# Patient Record
Sex: Female | Born: 1950 | Race: White | Hispanic: No | Smoking: Never smoker
Health system: Southern US, Community
[De-identification: ages and names within clinical notes are randomized; demographics above are authoritative.]

## PROBLEM LIST (undated history)

## (undated) DIAGNOSIS — F32A Depression, unspecified: Secondary | ICD-10-CM

## (undated) DIAGNOSIS — T7840XA Allergy, unspecified, initial encounter: Secondary | ICD-10-CM

## (undated) DIAGNOSIS — R569 Unspecified convulsions: Secondary | ICD-10-CM

## (undated) DIAGNOSIS — S069X9A Unspecified intracranial injury with loss of consciousness of unspecified duration, initial encounter: Secondary | ICD-10-CM

## (undated) DIAGNOSIS — S069XAA Unspecified intracranial injury with loss of consciousness status unknown, initial encounter: Secondary | ICD-10-CM

## (undated) DIAGNOSIS — E079 Disorder of thyroid, unspecified: Secondary | ICD-10-CM

## (undated) DIAGNOSIS — F329 Major depressive disorder, single episode, unspecified: Secondary | ICD-10-CM

## (undated) HISTORY — DX: Allergy, unspecified, initial encounter: T78.40XA

## (undated) HISTORY — DX: Unspecified convulsions: R56.9

## (undated) HISTORY — DX: Disorder of thyroid, unspecified: E07.9

## (undated) HISTORY — PX: URETERAL REIMPLANTATION OF TRANSPLANTED KIDNEY: SHX2610

## (undated) HISTORY — DX: Major depressive disorder, single episode, unspecified: F32.9

## (undated) HISTORY — DX: Depression, unspecified: F32.A

## (undated) HISTORY — PX: OTHER SURGICAL HISTORY: SHX169

---

## 2012-02-11 ENCOUNTER — Ambulatory Visit: Payer: PRIVATE HEALTH INSURANCE

## 2012-02-11 ENCOUNTER — Ambulatory Visit (INDEPENDENT_AMBULATORY_CARE_PROVIDER_SITE_OTHER): Payer: PRIVATE HEALTH INSURANCE | Admitting: Internal Medicine

## 2012-02-11 VITALS — BP 118/78 | HR 64 | Temp 97.5°F | Resp 16 | Ht 64.0 in | Wt 129.4 lb

## 2012-02-11 DIAGNOSIS — M79642 Pain in left hand: Secondary | ICD-10-CM

## 2012-02-11 DIAGNOSIS — M79609 Pain in unspecified limb: Secondary | ICD-10-CM

## 2012-02-11 NOTE — Progress Notes (Signed)
  Subjective:    Patient ID: Shannon Barry, female    DOB: [birth year], 61 y.o.   MRN: [id number]  HPIShe fell striking Left hand against gram about 2 hours ago She is concerned about the pain around her second And third metacarpals At first her hand would not been well but now it is better From [place]   Review of Systems     Objective:   Physical Exam The left hand shows a lack of full extension at the DIPs third finger  There is also mild exaggeration of extension at the PIP with tenderness along the medial and lateral border of the PIP The MCP is tender with mild swelling around the third  MCP     [facility] reading (PRIMARY) by  Dr. 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

## 2012-02-11 NOTE — Patient Instructions (Addendum)
X-rays do not reveal a fracture but your exam indicates concern for a tendon injury that is causing a boutonnieres deformity Please see your orthopedist this next week and keep your finger splinted in full extension of both joints

## 2015-03-10 ENCOUNTER — Ambulatory Visit (INDEPENDENT_AMBULATORY_CARE_PROVIDER_SITE_OTHER): Payer: PRIVATE HEALTH INSURANCE

## 2015-03-10 ENCOUNTER — Ambulatory Visit (INDEPENDENT_AMBULATORY_CARE_PROVIDER_SITE_OTHER): Payer: PRIVATE HEALTH INSURANCE | Admitting: Family Medicine

## 2015-03-10 VITALS — BP 108/74 | HR 72 | Temp 97.3°F | Resp 18 | Ht 63.5 in | Wt 142.0 lb

## 2015-03-10 DIAGNOSIS — W19XXXA Unspecified fall, initial encounter: Secondary | ICD-10-CM

## 2015-03-10 DIAGNOSIS — M79645 Pain in left finger(s): Secondary | ICD-10-CM

## 2015-03-10 DIAGNOSIS — S62639A Displaced fracture of distal phalanx of unspecified finger, initial encounter for closed fracture: Secondary | ICD-10-CM

## 2015-03-10 NOTE — Progress Notes (Signed)
   Subjective:    Patient ID: Shannon Barry, female    DOB: [birth year], 64 y.o.   MRN: [id number]  HPI Patient presents with son for left pinky finger pain following a fall sustained 2 days ago where she tried to catch herself and put too much weight onto finger/hand. States that swelling has improve, however, pain is worse, especially if she touches finger. Bruising is unchanged and able to move finger, but is very painful. Denies fever or numbness.  Right hand dominate.   Allergies  Allergen Reactions  . Penicillins Hives  . Sulfa Antibiotics Other (See Comments)    violently ill   Review of Systems As noted above.    Objective:   Physical Exam  Constitutional: She is oriented to person, place, and time. She appears well-developed and well-nourished. No distress.  Blood pressure 108/74, pulse 72, temperature 97.3 F (36.3 C), temperature source Oral, resp. rate 18, height 5' 3.5" (1.613 m), weight 142 lb (64.411 kg), SpO2 96 %.  HENT:  Head: Normocephalic and atraumatic.  Right Ear: External ear normal.  Left Ear: External ear normal.  Eyes: Conjunctivae are normal. Right eye exhibits no discharge. Left eye exhibits no discharge. No scleral icterus.  Pulmonary/Chest: Effort normal.  Musculoskeletal: She exhibits tenderness. She exhibits no edema.       Left hand: She exhibits decreased range of motion and tenderness. She exhibits no bony tenderness, normal capillary refill, no deformity, no laceration and no swelling. Normal sensation noted. Decreased strength noted. She exhibits thumb/finger opposition. She exhibits no finger abduction and no wrist extension trouble.       Hands: Neurological: She is alert and oriented to person, place, and time. No cranial nerve deficit or sensory deficit. She exhibits normal muscle tone.  Skin: Skin is warm, dry and intact. Bruising (left little finger) noted. No abrasion, no ecchymosis, no laceration and no lesion noted. She is not diaphoretic.  No erythema. No pallor.  Psychiatric: She has a normal mood and affect. Her behavior is normal. Judgment and thought content normal.   [facility] reading (PRIMARY) by  Dr. Alwyn Ren. Closed fracture of distal phalanx of left little finger.     Assessment & Plan:  1. Fall, initial encounter 2. Finger pain, left 3. Distal phalanx or phalanges, closed fracture, initial encounter Finger splinted until able to hand specialist. Patient declines medication for pain at this time.  - DG Finger Little Left; Future - Ambulatory referral to Hand Surgery    Janan Ridge PA-C  [facility] 03/10/2015 8:47 PM

## 2015-03-10 NOTE — Progress Notes (Signed)
X-ray reviewed with [employer].  Significant size avulsion fracture of left fifth finger proximal portion of distal phalanx laterally was noted. Splinting was discussed and decision was made to refer to hand orthopedics  Peyton Najjar M.D.

## 2015-03-18 ENCOUNTER — Encounter: Payer: Self-pay | Admitting: Neurology

## 2015-03-18 ENCOUNTER — Ambulatory Visit (INDEPENDENT_AMBULATORY_CARE_PROVIDER_SITE_OTHER): Payer: PRIVATE HEALTH INSURANCE | Admitting: Neurology

## 2015-03-18 ENCOUNTER — Telehealth: Payer: Self-pay

## 2015-03-18 VITALS — BP 104/60 | HR 76 | Resp 20 | Ht 63.0 in | Wt 137.0 lb

## 2015-03-18 DIAGNOSIS — F0391 Unspecified dementia with behavioral disturbance: Secondary | ICD-10-CM

## 2015-03-18 DIAGNOSIS — I69354 Hemiplegia and hemiparesis following cerebral infarction affecting left non-dominant side: Secondary | ICD-10-CM | POA: Diagnosis not present

## 2015-03-18 DIAGNOSIS — G40201 Localization-related (focal) (partial) symptomatic epilepsy and epileptic syndromes with complex partial seizures, not intractable, with status epilepticus: Secondary | ICD-10-CM | POA: Diagnosis not present

## 2015-03-18 DIAGNOSIS — R296 Repeated falls: Secondary | ICD-10-CM | POA: Diagnosis not present

## 2015-03-18 DIAGNOSIS — R4701 Aphasia: Secondary | ICD-10-CM | POA: Insufficient documentation

## 2015-03-18 DIAGNOSIS — F03918 Unspecified dementia, unspecified severity, with other behavioral disturbance: Secondary | ICD-10-CM

## 2015-03-18 DIAGNOSIS — R270 Ataxia, unspecified: Secondary | ICD-10-CM | POA: Diagnosis not present

## 2015-03-18 DIAGNOSIS — F0390 Unspecified dementia without behavioral disturbance: Secondary | ICD-10-CM | POA: Insufficient documentation

## 2015-03-18 DIAGNOSIS — S098XXA Other specified injuries of head, initial encounter: Secondary | ICD-10-CM | POA: Diagnosis not present

## 2015-03-18 MED ORDER — LAMOTRIGINE 100 MG PO TABS
100.0000 mg | ORAL_TABLET | Freq: Two times a day (BID) | ORAL | Status: DC
Start: 2015-03-18 — End: 2015-06-02

## 2015-03-18 MED ORDER — DIVALPROEX SODIUM 125 MG PO DR TAB: 125.0000 mg | DELAYED_RELEASE_TABLET | Freq: Three times a day (TID) | ORAL | Status: DC

## 2015-03-18 NOTE — Telephone Encounter (Signed)
Received a phone call from pt's son asking to speak to Dr. Vickey Hugerohmeier regarding her mother's worsening condition.  Dr. Vickey Hugerohmeier asked me to call Dr. Frederik PearHopper's office for a referral. Dr. Frederik PearHopper's office refused to give a referral because pt has not been seen for any head trauma and it was not noted in her history.  Dr. Vickey Hugerohmeier spoke to pt's husband, and asked me to put this patient on her schedule for today at 3:00.
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
## 2015-04-15 ENCOUNTER — Encounter (INDEPENDENT_AMBULATORY_CARE_PROVIDER_SITE_OTHER): Payer: Self-pay

## 2015-04-15 ENCOUNTER — Ambulatory Visit (INDEPENDENT_AMBULATORY_CARE_PROVIDER_SITE_OTHER): Payer: PRIVATE HEALTH INSURANCE

## 2015-04-15 DIAGNOSIS — S098XXA Other specified injuries of head, initial encounter: Secondary | ICD-10-CM | POA: Diagnosis not present

## 2015-04-15 DIAGNOSIS — G40201 Localization-related (focal) (partial) symptomatic epilepsy and epileptic syndromes with complex partial seizures, not intractable, with status epilepticus: Secondary | ICD-10-CM | POA: Diagnosis not present

## 2015-04-15 DIAGNOSIS — R270 Ataxia, unspecified: Secondary | ICD-10-CM

## 2015-04-15 DIAGNOSIS — R4701 Aphasia: Secondary | ICD-10-CM

## 2015-04-15 DIAGNOSIS — F0391 Unspecified dementia with behavioral disturbance: Secondary | ICD-10-CM

## 2015-04-15 DIAGNOSIS — F03918 Unspecified dementia, unspecified severity, with other behavioral disturbance: Secondary | ICD-10-CM

## 2015-04-17 ENCOUNTER — Telehealth: Payer: Self-pay | Admitting: Neurology

## 2015-04-17 NOTE — Telephone Encounter (Signed)
11/18-LVM on Son's phone a return call.  Pt's VM was full.  EEG needs to be r/s'd and possibly the f/u appt for 12/5 as well.-SLB

## 2015-04-20 ENCOUNTER — Telehealth: Payer: Self-pay

## 2015-04-20 NOTE — Telephone Encounter (Signed)
Pt has not signed a medical release form. Results not sent to Darden AmberLaura Oliver, NP at this time.

## 2015-04-20 NOTE — Telephone Encounter (Signed)
-----   Message from Melvyn Novasarmen Dohmeier, MD sent at 04/17/2015  1:21 PM EST ----- Clear evdence of traumatic brain injury as described above. Gliosis means  scar formation. NO ACUTE INJURY- but this explains a seizure focus and memory impairment. CD

## 2015-04-20 NOTE — Telephone Encounter (Signed)
Spoke to pt regarding her MRI results. I advised her that her MRI showed evidence of a TBI and some scar formation was noted. There was no acute injury, but these findings may explain why a pt is experiencing a seizure focus and memory impairment. Pt verbalized understanding. Pt asked me to fax these results to Darden AmberLaura Oliver, [place] in [place].

## 2015-04-21 NOTE — Telephone Encounter (Deleted)
Pt called and is wondering if she needs an EEG.Please call and advise

## 2015-04-27 ENCOUNTER — Other Ambulatory Visit: Payer: Self-pay

## 2015-05-04 ENCOUNTER — Ambulatory Visit (INDEPENDENT_AMBULATORY_CARE_PROVIDER_SITE_OTHER): Payer: PRIVATE HEALTH INSURANCE | Admitting: Neurology

## 2015-05-04 ENCOUNTER — Encounter (INDEPENDENT_AMBULATORY_CARE_PROVIDER_SITE_OTHER): Payer: Self-pay

## 2015-05-04 ENCOUNTER — Encounter: Payer: Self-pay | Admitting: Neurology

## 2015-05-04 ENCOUNTER — Telehealth: Payer: Self-pay | Admitting: Neurology

## 2015-05-04 VITALS — BP 122/72 | HR 84 | Resp 20 | Ht 62.0 in | Wt 136.0 lb

## 2015-05-04 DIAGNOSIS — S069X5S Unspecified intracranial injury with loss of consciousness greater than 24 hours with return to pre-existing conscious level, sequela: Secondary | ICD-10-CM | POA: Diagnosis not present

## 2015-05-04 DIAGNOSIS — G9389 Other specified disorders of brain: Secondary | ICD-10-CM

## 2015-05-04 DIAGNOSIS — F1097 Alcohol use, unspecified with alcohol-induced persisting dementia: Secondary | ICD-10-CM | POA: Diagnosis not present

## 2015-05-04 DIAGNOSIS — S069XAA Unspecified intracranial injury with loss of consciousness status unknown, initial encounter: Secondary | ICD-10-CM | POA: Insufficient documentation

## 2015-05-04 DIAGNOSIS — F1027 Alcohol dependence with alcohol-induced persisting dementia: Secondary | ICD-10-CM

## 2015-05-04 DIAGNOSIS — R561 Post traumatic seizures: Secondary | ICD-10-CM | POA: Insufficient documentation

## 2015-05-04 DIAGNOSIS — G969 Disorder of central nervous system, unspecified: Secondary | ICD-10-CM | POA: Insufficient documentation

## 2015-05-04 DIAGNOSIS — S069X9A Unspecified intracranial injury with loss of consciousness of unspecified duration, initial encounter: Secondary | ICD-10-CM | POA: Insufficient documentation

## 2015-05-04 MED ORDER — DIVALPROEX SODIUM 500 MG PO DR TAB: 500.0000 mg | DELAYED_RELEASE_TABLET | Freq: Two times a day (BID) | ORAL | Status: DC

## 2015-05-04 NOTE — Telephone Encounter (Signed)
Pt had been taking depake 500 mg 1 tablet in the morning and 2 tablets at bedtime.   Dr. Vickey Hugerohmeier refilled for depake 500 mg 1 tablet in the morning and 1 tablet at bedtime.  I will ask Dr. Vickey Hugerohmeier if she wants pt to take the dose with 1500mg  of depakote or to keep the 1000mg  of depakote daily as just ordered.

## 2015-05-04 NOTE — Addendum Note (Signed)
Addended by: Melvyn NovasHMEIER, Dale Ribeiro on: 05/04/2015 02:23 PM   Modules accepted: Orders

## 2015-05-04 NOTE — Telephone Encounter (Signed)
I am sorry I was under the impression I had not changed her dosing. It should still be 500 mg in the morning and thousand milligrams at night. I do think the tremor may have something to do with the Depakote.CD I have just reviewed the patient's signature and it says 1 tablet in the morning 2 at night but this remains to intake times even that the dose is not the same for morning and evening. She has 90 Depakote's per month that covers exactly 1 tablet in the morning 2 at night.

## 2015-05-04 NOTE — Telephone Encounter (Signed)
Tammy with [facility] called inquiring about qty and directions for divalproex (DEPAKOTE) 500 MG DR tablet . Previous was 500mg  1 am and 2 at bedtime. Please call [phone]
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
## 2015-05-05 MED ORDER — DIVALPROEX SODIUM 500 MG PO DR TAB: DELAYED_RELEASE_TABLET | ORAL | Status: DC

## 2015-05-05 NOTE — Telephone Encounter (Signed)
[facility] at [facility], he asked if Dr. Vickey Hugerohmeier wanted the change in depakote to 1 tablet in am and 1 tablet in pm or did she want the old sig for depakote, 1 tablet in the morning and 2 tablets at bedtime. I advised him that per Dr. Oliva Bustardohmeier's response in this conversation, it should be still 500mg  in the morning and 1000mg  at night. Shannon Barry verbalized understanding.

## 2015-05-19 ENCOUNTER — Other Ambulatory Visit: Payer: PRIVATE HEALTH INSURANCE

## 2015-05-20 ENCOUNTER — Encounter: Payer: Self-pay | Admitting: Neurology

## 2015-06-02 ENCOUNTER — Other Ambulatory Visit: Payer: Self-pay | Admitting: Neurology

## 2015-07-08 ENCOUNTER — Emergency Department (HOSPITAL_COMMUNITY)
Admission: EM | Admit: 2015-07-08 | Discharge: 2015-07-08 | Disposition: A | Discharge status: Home / Self Care | Payer: Self-pay | Attending: Emergency Medicine | Admitting: Emergency Medicine

## 2015-07-08 ENCOUNTER — Encounter (HOSPITAL_COMMUNITY): Payer: Self-pay | Admitting: Emergency Medicine

## 2015-07-08 DIAGNOSIS — Z87820 Personal history of traumatic brain injury: Secondary | ICD-10-CM | POA: Insufficient documentation

## 2015-07-08 DIAGNOSIS — R42 Dizziness and giddiness: Secondary | ICD-10-CM | POA: Insufficient documentation

## 2015-07-08 DIAGNOSIS — Z880 Allergy status to penicillin: Secondary | ICD-10-CM | POA: Insufficient documentation

## 2015-07-08 DIAGNOSIS — F329 Major depressive disorder, single episode, unspecified: Secondary | ICD-10-CM | POA: Insufficient documentation

## 2015-07-08 DIAGNOSIS — Z79899 Other long term (current) drug therapy: Secondary | ICD-10-CM | POA: Insufficient documentation

## 2015-07-08 DIAGNOSIS — E079 Disorder of thyroid, unspecified: Secondary | ICD-10-CM | POA: Insufficient documentation

## 2015-07-08 HISTORY — DX: Unspecified intracranial injury with loss of consciousness status unknown, initial encounter: S06.9XAA

## 2015-07-08 HISTORY — DX: Unspecified intracranial injury with loss of consciousness of unspecified duration, initial encounter: S06.9X9A

## 2015-07-08 LAB — URINALYSIS, ROUTINE W REFLEX MICROSCOPIC
BILIRUBIN URINE: NEGATIVE
Glucose, UA: NEGATIVE mg/dL
KETONES UR: NEGATIVE mg/dL
NITRITE: NEGATIVE
PH: 6.5 (ref 5.0–8.0)
PROTEIN: NEGATIVE mg/dL
Specific Gravity, Urine: 1.023 (ref 1.005–1.030)

## 2015-07-08 LAB — URINE MICROSCOPIC-ADD ON

## 2015-07-08 LAB — BASIC METABOLIC PANEL
Anion gap: 6 (ref 5–15)
BUN: 16 mg/dL (ref 6–20)
CALCIUM: 8.8 mg/dL — AB (ref 8.9–10.3)
CO2: 29 mmol/L (ref 22–32)
Chloride: 98 mmol/L — ABNORMAL LOW (ref 101–111)
Creatinine, Ser: 0.66 mg/dL (ref 0.44–1.00)
GFR calc Af Amer: >60 mL/min (ref >60)
GLUCOSE: 85 mg/dL (ref 65–99)
Potassium: 4.7 mmol/L (ref 3.5–5.1)
SODIUM: 133 mmol/L — AB (ref 135–145)

## 2015-07-08 LAB — CBC
HCT: 36.3 % (ref 36.0–46.0)
Hemoglobin: 12.2 g/dL (ref 12.0–15.0)
MCH: 31.3 pg (ref 26.0–34.0)
MCHC: 33.6 g/dL (ref 30.0–36.0)
MCV: 93.1 fL (ref 78.0–100.0)
PLATELETS: 166 10*3/uL (ref 150–400)
RBC: 3.9 MIL/uL (ref 3.87–5.11)
RDW: 13.3 % (ref 11.5–15.5)
WBC: 5.8 10*3/uL (ref 4.0–10.5)

## 2015-07-08 LAB — CBG MONITORING, ED: GLUCOSE-CAPILLARY: 83 mg/dL (ref 65–99)

## 2015-07-08 MED ORDER — MECLIZINE HCL 25 MG PO TABS
25.0000 mg | ORAL_TABLET | Freq: Once | ORAL | Status: AC
  Administered 2015-07-08: 25 mg via ORAL
  Filled 2015-07-08: qty 1

## 2015-07-08 MED ORDER — PROMETHAZINE HCL 25 MG PO TABS
12.5000 mg | ORAL_TABLET | Freq: Once | ORAL | Status: AC
  Administered 2015-07-08: 12.5 mg via ORAL
  Filled 2015-07-08: qty 1

## 2015-07-08 MED ORDER — MECLIZINE HCL 50 MG PO TABS: 25.0000 mg | ORAL_TABLET | Freq: Three times a day (TID) | ORAL | Status: DC | PRN

## 2015-07-08 NOTE — ED Notes (Signed)
Pt ambulated to bathroom with assist x1 without difficulty

## 2015-07-08 NOTE — Discharge Instructions (Signed)

## 2015-07-08 NOTE — ED Notes (Signed)
Patient reports feeling dizzy which began this morning.  Reports when moving in one direction she "keeps going".  Patient does not use assistive devices at present.  Denies headache, blurred vision, weakness.

## 2015-07-08 NOTE — ED Notes (Signed)
Per EMS- Patient is a resident of [facility].Patient c/o dizziness. Patient reported that she almost fell, but did not.

## 2015-07-08 NOTE — ED Notes (Signed)
Ambulated patient in hallway.  Patient still states she feels unsteady.  Gait noted to be unsteady but able to ambulated with assistance x 1.

## 2015-07-09 ENCOUNTER — Other Ambulatory Visit: Payer: PRIVATE HEALTH INSURANCE

## 2015-07-09 LAB — URINE CULTURE

## 2015-07-09 NOTE — ED Provider Notes (Signed)
CSN: [id number]     Arrival date & time 07/08/15  1049 History   First MD Initiated Contact with Patient 07/08/15 1201     Chief Complaint  Patient presents with  . Dizziness     (Consider location/radiation/quality/duration/timing/severity/associated sxs/prior Treatment) HPI   64yF with vertigo. Onset this morning as was getting out of bed. Felt very off balance and had to sit back down. Symptoms improved but then returned as she went to get back up. Had to hold onto things to steady herself. Has had similar symptoms previously, but not quite this severe. No acute pain. NO visual changes. No numbness, tingling or focal loss of strength. No tinnitus or ear fullness. No intervention prior to arrival.   Past Medical History  Diagnosis Date  . Depression   . Seizures (HCC)   . Allergy   . Thyroid disease   . Traumatic brain injury [facility])    History reviewed. No pertinent past surgical history. History reviewed. No pertinent family history. Social History  Substance Use Topics  . Smoking status: Never Smoker   . Smokeless tobacco: None  . Alcohol Use: No   OB History    No data available     Review of Systems  All systems reviewed and negative, other than as noted in HPI.   Allergies  Penicillins and Sulfa antibiotics  Home Medications   Prior to Admission medications   Medication Sig Start Date End Date Taking? Authorizing Provider  desvenlafaxine (PRISTIQ) 100 MG 24 hr tablet Take 100 mg by mouth daily.    Yes Historical Provider, MD  divalproex (DEPAKOTE) 500 MG DR tablet Take 1 tablet in the morning and 2 tablets at night. 05/05/15  Yes Carmen Dohmeier, MD  lamoTRIgine (LAMICTAL) 100 MG tablet TAKE 2 TABLET BY MOUTH AT BEDTIME Patient taking differently: TAKE 1 TABLET BY MOUTH AT BEDTIME 06/03/15  Yes Carmen Dohmeier, MD  levothyroxine (SYNTHROID, LEVOTHROID) 75 MCG tablet Take 75 mcg by mouth daily.   Yes Historical Provider, MD  meclizine (ANTIVERT) 50 MG tablet Take  0.5 tablets (25 mg total) by mouth 3 (three) times daily as needed for dizziness. 07/08/15   [name], MD   BP 139/72 mmHg  Pulse 71  Temp(Src) 98.1 F (36.7 C)  Resp 16  Ht  (1.6 m)  Wt 130 lb (58.968 kg)  BMI 23.03 kg/m2  SpO2 100% Physical Exam  Constitutional: She is oriented to person, place, and time. She appears well-developed and well-nourished. No distress.  HENT:  Head: Normocephalic and atraumatic.  Right Ear: External ear normal.  Left Ear: External ear normal.  Eyes: Conjunctivae and EOM are normal. Pupils are equal, round, and reactive to light. Right eye exhibits no discharge. Left eye exhibits no discharge.  Neck: Neck supple.  Cardiovascular: Normal rate, regular rhythm and normal heart sounds.  Exam reveals no gallop and no friction rub.   No murmur heard. Pulmonary/Chest: Effort normal and breath sounds normal. No respiratory distress.  Abdominal: Soft. She exhibits no distension. There is no tenderness.  Musculoskeletal: She exhibits no edema or tenderness.  Neurological: She is alert and oriented to person, place, and time. No cranial nerve deficit. She exhibits normal muscle tone. Coordination normal.  Good finger to nose bilaterally. Ambulated without assistance.  Skin: Skin is warm and dry.  Psychiatric: She has a normal mood and affect. Her behavior is normal. Thought content normal.  Nursing note and vitals reviewed.   ED Course  Procedures (including critical care  time) Labs Review Labs Reviewed  BASIC METABOLIC PANEL - Abnormal; Notable for the following:    Sodium 133 (*)    Chloride 98 (*)    Calcium 8.8 (*)    All other components within normal limits  URINALYSIS, ROUTINE W REFLEX MICROSCOPIC (NOT AT [facility]) - Abnormal; Notable for the following:    Hgb urine dipstick TRACE (*)    Leukocytes, UA SMALL (*)    All other components within normal limits  URINE MICROSCOPIC-ADD ON - Abnormal; Notable for the following:    Squamous Epithelial  / LPF 0-5 (*)    Bacteria, UA RARE (*)    All other components within normal limits  URINE CULTURE  CBC  CBG MONITORING, ED    Imaging Review No results found. I have personally reviewed and evaluated these images and lab results as part of my medical decision-making.   EKG Interpretation   Date/Time:  Wednesday July 08 2015 11:17:35 EST Ventricular Rate:  76 PR Interval:  173 QRS Duration: 76 QT Interval:  367 QTC Calculation: 413 R Axis:   71 Text Interpretation:  Sinus rhythm Low voltage, precordial leads Baseline  wander in lead(s) V2 V5 No old tracing to compare Confirmed by FLOYD MD,  DANIEL [phone]) on 07/08/2015 12:01:05 PM      MDM   Final diagnoses:  Vertigo    65 year old female with symptoms consistent with peripheral vertigo. Central causes considered, but I have a low suspicion.  Nonfocal neurological examination. Treated symptomatically with improvement. Plan continued symtpomatic tx.     [name], MD 07/09/15 [phone]

## 2015-07-10 ENCOUNTER — Encounter: Payer: Self-pay | Admitting: Neurology

## 2015-07-10 ENCOUNTER — Encounter (HOSPITAL_COMMUNITY): Payer: Self-pay | Admitting: Emergency Medicine

## 2015-07-10 ENCOUNTER — Inpatient Hospital Stay (HOSPITAL_COMMUNITY)
Admission: EM | Admit: 2015-07-10 | Discharge: 2015-07-15 | DRG: 690 | Disposition: A | Discharge status: Home Health | Payer: PRIVATE HEALTH INSURANCE | Attending: Internal Medicine | Admitting: Internal Medicine

## 2015-07-10 DIAGNOSIS — R561 Post traumatic seizures: Secondary | ICD-10-CM | POA: Diagnosis present

## 2015-07-10 DIAGNOSIS — R569 Unspecified convulsions: Secondary | ICD-10-CM

## 2015-07-10 DIAGNOSIS — F0390 Unspecified dementia without behavioral disturbance: Secondary | ICD-10-CM | POA: Diagnosis present

## 2015-07-10 DIAGNOSIS — E871 Hypo-osmolality and hyponatremia: Secondary | ICD-10-CM | POA: Diagnosis present

## 2015-07-10 DIAGNOSIS — Z882 Allergy status to sulfonamides status: Secondary | ICD-10-CM

## 2015-07-10 DIAGNOSIS — E860 Dehydration: Secondary | ICD-10-CM | POA: Diagnosis present

## 2015-07-10 DIAGNOSIS — R112 Nausea with vomiting, unspecified: Secondary | ICD-10-CM | POA: Diagnosis present

## 2015-07-10 DIAGNOSIS — Z79899 Other long term (current) drug therapy: Secondary | ICD-10-CM

## 2015-07-10 DIAGNOSIS — D696 Thrombocytopenia, unspecified: Secondary | ICD-10-CM | POA: Diagnosis present

## 2015-07-10 DIAGNOSIS — E039 Hypothyroidism, unspecified: Secondary | ICD-10-CM | POA: Diagnosis present

## 2015-07-10 DIAGNOSIS — F329 Major depressive disorder, single episode, unspecified: Secondary | ICD-10-CM | POA: Diagnosis present

## 2015-07-10 DIAGNOSIS — R269 Unspecified abnormalities of gait and mobility: Secondary | ICD-10-CM | POA: Diagnosis present

## 2015-07-10 DIAGNOSIS — Z880 Allergy status to penicillin: Secondary | ICD-10-CM

## 2015-07-10 DIAGNOSIS — R42 Dizziness and giddiness: Secondary | ICD-10-CM | POA: Insufficient documentation

## 2015-07-10 DIAGNOSIS — N390 Urinary tract infection, site not specified: Secondary | ICD-10-CM | POA: Diagnosis not present

## 2015-07-10 DIAGNOSIS — Z87820 Personal history of traumatic brain injury: Secondary | ICD-10-CM

## 2015-07-10 DIAGNOSIS — R079 Chest pain, unspecified: Secondary | ICD-10-CM | POA: Diagnosis present

## 2015-07-10 DIAGNOSIS — Z791 Long term (current) use of non-steroidal anti-inflammatories (NSAID): Secondary | ICD-10-CM

## 2015-07-10 MED ORDER — MECLIZINE HCL 25 MG PO TABS
25.0000 mg | ORAL_TABLET | Freq: Once | ORAL | Status: AC
  Administered 2015-07-11: 25 mg via ORAL
  Filled 2015-07-10: qty 1

## 2015-07-10 NOTE — ED Notes (Signed)
Bed: WA22 Expected date:  Expected time:  Means of arrival:  Comments: 

## 2015-07-10 NOTE — ED Notes (Signed)
[facility] presents with a 65 yo female from [place] with nausea and vomiting since this evening around dinner time per patient.  No CP/No SOB.  [facility] gave 4 mg Zofran IV with slight relief of symptoms.  VS stable.

## 2015-07-10 NOTE — ED Provider Notes (Signed)
CSN: [id number]     Arrival date & time 07/10/15  2230 History   First MD Initiated Contact with Patient 07/10/15 2251     Chief Complaint  Patient presents with  . Nausea  . Emesis     (Consider location/radiation/quality/duration/timing/severity/associated sxs/prior Treatment) The history is provided by the patient, a relative and medical records. No language interpreter was used.     Shannon Barry is a 65 y.o. female  with a PMH of seizures currently on lamictal and depakote, TBI, hypothyroidism who presents to the Emergency Department complaining of dizziness, nausea, and vomiting since 5:30 tonight. Patient is unable to specify whether dizziness preceded n/v. Dizziness worse with movement. Improved with lying down and keeping eyes closed. Seen in ED for dizziness two days ago, states symptoms improved. Per son at bedside, patient seemed a little confused two days ago similar to her usual post ictal state, and again today - there have been no witnessed seizures. Denies tinnitus, no visual changes, slurred speech.   Past Medical History  Diagnosis Date  . Depression   . Seizures (HCC)   . Allergy   . Thyroid disease   . Traumatic brain injury [facility])    History reviewed. No pertinent past surgical history. History reviewed. No pertinent family history. Social History  Substance Use Topics  . Smoking status: Never Smoker   . Smokeless tobacco: None  . Alcohol Use: No   OB History    No data available     Review of Systems  Constitutional: Negative for fever and chills.  HENT: Negative for congestion and sore throat.   Eyes: Negative for photophobia.  Respiratory: Negative for cough, shortness of breath and wheezing.   Cardiovascular: Negative.   Gastrointestinal: Positive for nausea and vomiting. Negative for abdominal pain.  Genitourinary: Negative for dysuria.  Musculoskeletal: Negative for back pain and neck pain.  Skin: Negative for rash.  Neurological: Positive for  dizziness and headaches. Negative for syncope.      Allergies  Penicillins and Sulfa antibiotics  Home Medications   Prior to Admission medications   Medication Sig Start Date End Date Taking? Authorizing Provider  desvenlafaxine (PRISTIQ) 100 MG 24 hr tablet Take 100 mg by mouth daily.    Yes Historical Provider, MD  divalproex (DEPAKOTE) 500 MG DR tablet Take 1 tablet in the morning and 2 tablets at night. 05/05/15  Yes Carmen Dohmeier, MD  ibuprofen (ADVIL,MOTRIN) 200 MG tablet Take 200 mg by mouth daily.   Yes Historical Provider, MD  lamoTRIgine (LAMICTAL) 100 MG tablet TAKE 2 TABLET BY MOUTH AT BEDTIME Patient taking differently: TAKE 1 TABLET BY MOUTH AT BEDTIME 06/03/15  Yes Carmen Dohmeier, MD  levothyroxine (SYNTHROID, LEVOTHROID) 75 MCG tablet Take 75 mcg by mouth daily.   Yes Historical Provider, MD  meclizine (ANTIVERT) 25 MG tablet Take 1 tablet (25 mg total) by mouth 3 (three) times daily as needed for dizziness. 07/11/15   Chase Picket Ward, PA-C  ondansetron (ZOFRAN) 4 MG tablet Take 1 tablet (4 mg total) by mouth every 8 (eight) hours as needed for nausea or vomiting. 07/11/15   Chase Picket Ward, PA-C   BP 144/75 mmHg  Pulse 76  Temp(Src) 98.9 F (37.2 C) (Oral)  Resp 18  SpO2 100% Physical Exam  Constitutional: She is oriented to person, place, and time. She appears well-developed and well-nourished.  NAD  HENT:  Head: Normocephalic and atraumatic.  No evidence of tongue biting.   Cardiovascular: Normal rate, regular rhythm, normal  heart sounds and intact distal pulses.  Exam reveals no gallop and no friction rub.   No murmur heard. Pulmonary/Chest: Effort normal and breath sounds normal. No respiratory distress. She has no wheezes. She has no rales. She exhibits no tenderness.  Abdominal: Soft. Bowel sounds are normal. She exhibits no distension and no mass. There is no tenderness. There is no rebound and no guarding.  Musculoskeletal: She exhibits no edema.   Neurological: She is alert and oriented to person, place, and time. No cranial nerve deficit.  Alert, oriented, thought content appropriate, able to follow commands.  Cranial Nerves:  II:  Peripheral visual fields grossly normal, pupils equal, round, reactive to light III, IV, VI: EOM intact bilaterally, ptosis not present V,VII: smile symmetric, eyes kept closed tightly against resistance, facial light touch sensation equal VIII: hearing grossly normal IX, X: symmetric soft palate movement, uvula elevates symmetrically  XI: bilateral shoulder shrug symmetric and strong XII: midline tongue extension 5/5 muscle strength in upper and lower extremities bilaterally including strong and equal grip strength and dorsiflexion/plantar flexion Sensory to light touch normal in all four extremities.  Normal finger-to-nose and rapid alternating movements  Skin: Skin is warm and dry. No rash noted.  Psychiatric: She has a normal mood and affect. Her behavior is normal. Judgment and thought content normal.  Nursing note and vitals reviewed.   ED Course  Procedures (including critical care time) Labs Review Labs Reviewed  COMPREHENSIVE METABOLIC PANEL - Abnormal; Notable for the following:    Sodium 125 (*)    Chloride 89 (*)    Glucose, Bld 125 (*)    Calcium 8.7 (*)    Total Protein 6.3 (*)    ALT 9 (*)    All other components within normal limits  URINALYSIS, ROUTINE W REFLEX MICROSCOPIC (NOT AT [facility]) - Abnormal; Notable for the following:    Hgb urine dipstick TRACE (*)    Ketones, ur >80 (*)    Protein, ur 30 (*)    Leukocytes, UA SMALL (*)    All other components within normal limits  URINE MICROSCOPIC-ADD ON - Abnormal; Notable for the following:    Squamous Epithelial / LPF 0-5 (*)    Bacteria, UA FEW (*)    All other components within normal limits  URINE CULTURE  VALPROIC ACID LEVEL  CBC WITH DIFFERENTIAL/PLATELET  LIPASE, BLOOD  LAMOTRIGINE LEVEL    Imaging Review No  results found. I have personally reviewed and evaluated these images and lab results as part of my medical decision-making.   EKG Interpretation None      MDM   Final diagnoses:  Vertigo   Brittany Osier presents with dizziness, nausea, and vomiting start today at 5:30 pm. No focal neuro deficits.   1:26 AM - Patient re-evaluated and feels much improved after meclizine. UA with large amounts of wbc, small leuks - Patient denies dysuria, frequency, urgency. No suprapubic tenderness.   CMP with sodium of 125 and cl of 89. When here on 2/08 sodium was 133 and cl 98 Due to recurring symptoms of n/v/dizziness and decline in CMP values, consult to hospitalist, Dr. Lovell Sheehan, who will admit.    [facility] Ward, PA-C 07/11/15 0230  Lorre Nick, MD 07/12/15 [phone]

## 2015-07-11 ENCOUNTER — Observation Stay (HOSPITAL_COMMUNITY): Payer: PRIVATE HEALTH INSURANCE

## 2015-07-11 DIAGNOSIS — F329 Major depressive disorder, single episode, unspecified: Secondary | ICD-10-CM | POA: Diagnosis present

## 2015-07-11 DIAGNOSIS — R569 Unspecified convulsions: Secondary | ICD-10-CM

## 2015-07-11 DIAGNOSIS — Z882 Allergy status to sulfonamides status: Secondary | ICD-10-CM | POA: Diagnosis not present

## 2015-07-11 DIAGNOSIS — Z87820 Personal history of traumatic brain injury: Secondary | ICD-10-CM | POA: Diagnosis not present

## 2015-07-11 DIAGNOSIS — E871 Hypo-osmolality and hyponatremia: Secondary | ICD-10-CM | POA: Diagnosis present

## 2015-07-11 DIAGNOSIS — Z791 Long term (current) use of non-steroidal anti-inflammatories (NSAID): Secondary | ICD-10-CM | POA: Diagnosis not present

## 2015-07-11 DIAGNOSIS — R561 Post traumatic seizures: Secondary | ICD-10-CM | POA: Diagnosis present

## 2015-07-11 DIAGNOSIS — Z79899 Other long term (current) drug therapy: Secondary | ICD-10-CM | POA: Diagnosis not present

## 2015-07-11 DIAGNOSIS — N390 Urinary tract infection, site not specified: Secondary | ICD-10-CM | POA: Diagnosis present

## 2015-07-11 DIAGNOSIS — R112 Nausea with vomiting, unspecified: Secondary | ICD-10-CM | POA: Diagnosis present

## 2015-07-11 DIAGNOSIS — R079 Chest pain, unspecified: Secondary | ICD-10-CM | POA: Diagnosis present

## 2015-07-11 DIAGNOSIS — R42 Dizziness and giddiness: Secondary | ICD-10-CM | POA: Diagnosis not present

## 2015-07-11 DIAGNOSIS — Z880 Allergy status to penicillin: Secondary | ICD-10-CM | POA: Diagnosis not present

## 2015-07-11 DIAGNOSIS — D696 Thrombocytopenia, unspecified: Secondary | ICD-10-CM | POA: Diagnosis present

## 2015-07-11 DIAGNOSIS — E860 Dehydration: Secondary | ICD-10-CM | POA: Diagnosis present

## 2015-07-11 DIAGNOSIS — E039 Hypothyroidism, unspecified: Secondary | ICD-10-CM | POA: Diagnosis present

## 2015-07-11 DIAGNOSIS — F0390 Unspecified dementia without behavioral disturbance: Secondary | ICD-10-CM | POA: Diagnosis present

## 2015-07-11 DIAGNOSIS — R269 Unspecified abnormalities of gait and mobility: Secondary | ICD-10-CM | POA: Diagnosis present

## 2015-07-11 LAB — TROPONIN I
Troponin I: <0.03 ng/mL (ref <0.031)
Troponin I: <0.03 ng/mL (ref <0.031)
Troponin I: <0.03 ng/mL (ref <0.031)

## 2015-07-11 LAB — CBC WITH DIFFERENTIAL/PLATELET
BASOS PCT: 0 %
Basophils Absolute: 0 10*3/uL (ref 0.0–0.1)
Eosinophils Absolute: 0 10*3/uL (ref 0.0–0.7)
Eosinophils Relative: 0 %
HEMATOCRIT: 39.4 % (ref 36.0–46.0)
HEMOGLOBIN: 13.8 g/dL (ref 12.0–15.0)
Lymphocytes Relative: 10 %
Lymphs Abs: 0.7 10*3/uL (ref 0.7–4.0)
MCH: 31.7 pg (ref 26.0–34.0)
MCHC: 35 g/dL (ref 30.0–36.0)
MCV: 90.6 fL (ref 78.0–100.0)
MONOS PCT: 4 %
Monocytes Absolute: 0.3 10*3/uL (ref 0.1–1.0)
NEUTROS ABS: 5.8 10*3/uL (ref 1.7–7.7)
NEUTROS PCT: 86 %
Platelets: 166 10*3/uL (ref 150–400)
RBC: 4.35 MIL/uL (ref 3.87–5.11)
RDW: 12.6 % (ref 11.5–15.5)
WBC: 6.7 10*3/uL (ref 4.0–10.5)

## 2015-07-11 LAB — BASIC METABOLIC PANEL
ANION GAP: 9 (ref 5–15)
BUN: 11 mg/dL (ref 6–20)
CHLORIDE: 91 mmol/L — AB (ref 101–111)
CO2: 27 mmol/L (ref 22–32)
CREATININE: 0.56 mg/dL (ref 0.44–1.00)
Calcium: 9.1 mg/dL (ref 8.9–10.3)
GFR calc non Af Amer: >60 mL/min (ref >60)
Glucose, Bld: 119 mg/dL — ABNORMAL HIGH (ref 65–99)
POTASSIUM: 4.8 mmol/L (ref 3.5–5.1)
SODIUM: 127 mmol/L — AB (ref 135–145)

## 2015-07-11 LAB — COMPREHENSIVE METABOLIC PANEL
ALBUMIN: 3.5 g/dL (ref 3.5–5.0)
ALK PHOS: 44 U/L (ref 38–126)
ALT: 9 U/L — AB (ref 14–54)
ANION GAP: 11 (ref 5–15)
AST: 19 U/L (ref 15–41)
BILIRUBIN TOTAL: 0.6 mg/dL (ref 0.3–1.2)
BUN: 16 mg/dL (ref 6–20)
CALCIUM: 8.7 mg/dL — AB (ref 8.9–10.3)
CO2: 25 mmol/L (ref 22–32)
CREATININE: 0.56 mg/dL (ref 0.44–1.00)
Chloride: 89 mmol/L — ABNORMAL LOW (ref 101–111)
GFR calc Af Amer: >60 mL/min (ref >60)
GFR calc non Af Amer: >60 mL/min (ref >60)
GLUCOSE: 125 mg/dL — AB (ref 65–99)
Potassium: 4 mmol/L (ref 3.5–5.1)
SODIUM: 125 mmol/L — AB (ref 135–145)
TOTAL PROTEIN: 6.3 g/dL — AB (ref 6.5–8.1)

## 2015-07-11 LAB — URINALYSIS, ROUTINE W REFLEX MICROSCOPIC
BILIRUBIN URINE: NEGATIVE
GLUCOSE, UA: NEGATIVE mg/dL
Nitrite: NEGATIVE
PH: 6 (ref 5.0–8.0)
Protein, ur: 30 mg/dL — AB
SPECIFIC GRAVITY, URINE: 1.025 (ref 1.005–1.030)

## 2015-07-11 LAB — CBC
HCT: 36.3 % (ref 36.0–46.0)
HEMOGLOBIN: 13 g/dL (ref 12.0–15.0)
MCH: 31.8 pg (ref 26.0–34.0)
MCHC: 35.8 g/dL (ref 30.0–36.0)
MCV: 88.8 fL (ref 78.0–100.0)
PLATELETS: 165 10*3/uL (ref 150–400)
RBC: 4.09 MIL/uL (ref 3.87–5.11)
RDW: 12.5 % (ref 11.5–15.5)
WBC: 5.2 10*3/uL (ref 4.0–10.5)

## 2015-07-11 LAB — URINE MICROSCOPIC-ADD ON

## 2015-07-11 LAB — NA AND K (SODIUM & POTASSIUM), RAND UR
Potassium Urine: 64 mmol/L
Sodium, Ur: 58 mmol/L

## 2015-07-11 LAB — LIPASE, BLOOD: Lipase: 23 U/L (ref 11–51)

## 2015-07-11 LAB — ETHANOL: Alcohol, Ethyl (B): <5 mg/dL (ref <5)

## 2015-07-11 LAB — OSMOLALITY, URINE: Osmolality, Ur: 601 mOsm/kg (ref 300–900)

## 2015-07-11 LAB — VALPROIC ACID LEVEL: Valproic Acid Lvl: 92 ug/mL (ref 50.0–100.0)

## 2015-07-11 MED ORDER — HYDROMORPHONE HCL 1 MG/ML IJ SOLN: 0.5000 mg | INTRAMUSCULAR | Status: DC | PRN

## 2015-07-11 MED ORDER — MECLIZINE HCL 25 MG PO TABS: 25.0000 mg | ORAL_TABLET | Freq: Three times a day (TID) | ORAL | Status: DC | PRN

## 2015-07-11 MED ORDER — ONDANSETRON HCL 4 MG PO TABS: 4.0000 mg | ORAL_TABLET | Freq: Three times a day (TID) | ORAL | Status: DC | PRN

## 2015-07-11 MED ORDER — DIVALPROEX SODIUM 250 MG PO DR TAB
500.0000 mg | DELAYED_RELEASE_TABLET | Freq: Two times a day (BID) | ORAL | Status: DC
  Administered 2015-07-11 – 2015-07-15 (×10): 500 mg via ORAL
  Filled 2015-07-11 (×11): qty 2

## 2015-07-11 MED ORDER — SODIUM CHLORIDE 0.9 % IV SOLN: 250.0000 mL | INTRAVENOUS | Status: DC | PRN

## 2015-07-11 MED ORDER — SODIUM CHLORIDE 0.9% FLUSH
3.0000 mL | Freq: Two times a day (BID) | INTRAVENOUS | Status: DC
  Administered 2015-07-11 – 2015-07-14 (×6): 3 mL via INTRAVENOUS

## 2015-07-11 MED ORDER — ONDANSETRON HCL 4 MG/2ML IJ SOLN: 4.0000 mg | Freq: Four times a day (QID) | INTRAMUSCULAR | Status: DC | PRN

## 2015-07-11 MED ORDER — SODIUM CHLORIDE 0.9% FLUSH: 3.0000 mL | INTRAVENOUS | Status: DC | PRN

## 2015-07-11 MED ORDER — LEVOTHYROXINE SODIUM 50 MCG PO TABS
75.0000 ug | ORAL_TABLET | Freq: Every day | ORAL | Status: DC
  Administered 2015-07-11 – 2015-07-15 (×5): 75 ug via ORAL
  Filled 2015-07-11 (×5): qty 1

## 2015-07-11 MED ORDER — ACETAMINOPHEN 650 MG RE SUPP: 650.0000 mg | Freq: Four times a day (QID) | RECTAL | Status: DC | PRN

## 2015-07-11 MED ORDER — LAMOTRIGINE 100 MG PO TABS
100.0000 mg | ORAL_TABLET | Freq: Every day | ORAL | Status: DC
  Administered 2015-07-11 – 2015-07-14 (×4): 100 mg via ORAL
  Filled 2015-07-11 (×4): qty 1

## 2015-07-11 MED ORDER — ENOXAPARIN SODIUM 40 MG/0.4ML ~~LOC~~ SOLN
40.0000 mg | SUBCUTANEOUS | Status: DC
  Administered 2015-07-11: 40 mg via SUBCUTANEOUS
  Filled 2015-07-11: qty 0.4

## 2015-07-11 MED ORDER — DEXTROSE 5 % IV SOLN
1.0000 g | INTRAVENOUS | Status: DC
  Administered 2015-07-11 – 2015-07-13 (×3): 1 g via INTRAVENOUS
  Filled 2015-07-11 (×4): qty 10

## 2015-07-11 MED ORDER — ALUM & MAG HYDROXIDE-SIMETH 200-200-20 MG/5ML PO SUSP: 30.0000 mL | Freq: Four times a day (QID) | ORAL | Status: DC | PRN

## 2015-07-11 MED ORDER — ACETAMINOPHEN 325 MG PO TABS: 650.0000 mg | ORAL_TABLET | Freq: Four times a day (QID) | ORAL | Status: DC | PRN

## 2015-07-11 MED ORDER — ONDANSETRON HCL 4 MG PO TABS: 4.0000 mg | ORAL_TABLET | Freq: Four times a day (QID) | ORAL | Status: DC | PRN

## 2015-07-11 MED ORDER — SODIUM CHLORIDE 0.9 % IV SOLN
INTRAVENOUS | Status: DC
  Administered 2015-07-11 – 2015-07-14 (×5): via INTRAVENOUS

## 2015-07-11 MED ORDER — OXYCODONE HCL 5 MG PO TABS: 5.0000 mg | ORAL_TABLET | ORAL | Status: DC | PRN

## 2015-07-11 MED ORDER — SODIUM CHLORIDE 0.9% FLUSH: 3.0000 mL | Freq: Two times a day (BID) | INTRAVENOUS | Status: DC

## 2015-07-11 MED ORDER — VENLAFAXINE HCL ER 75 MG PO CP24
150.0000 mg | ORAL_CAPSULE | Freq: Every day | ORAL | Status: DC
  Administered 2015-07-11 – 2015-07-15 (×5): 150 mg via ORAL
  Filled 2015-07-11 (×5): qty 2

## 2015-07-11 NOTE — Progress Notes (Signed)
Patient seen and examined  65 year old female with a history of depression, seizures, thyroid disease, traumatic brain injury, who was in the ER on 2/8 with vertigo, started on meclizine, discharge from the ER, returned on 2/10 for nausea and vomiting associated with dizziness. Found to have a urinary tract infection. Sodium 125.  Plan Hypovolemic hyponatremia- Improving with IV fluids, check TSH Chest x-ray negative  UTI,-started on Rocephin, follow urine culture,  Rest of the medical problems, as per H&P

## 2015-07-11 NOTE — H&P (Signed)
[facility] Admission History and Physical       Shannon Barry YQM:[id number] DOB: 23-Aug-1950 DOA: 07/10/2015  Referring physician:  PCP: Elmarie Mainland, NP  Specialists:   Chief Complaint: Nausea and Vomiting and Dizziness  HPI: Shannon Barry is a 65 y.o. female with a history of TBI, Seizures, and Hypothyroid who presents to the ED with complaints of Nausea and Vomiting and dizziness x 3 days.   She had been seen in the ED on 02/08 for the same symptoms and given medications and was without relief.    She was found to have hyponatremia of 125 today, and previously had been  133 on 02/08.    She was referred for observation.   She also reports having sharp chest pain.     Review of Systems:  Constitutional: No Weight Loss, No Weight Gain, Night Sweats, Fevers, Chills, +Dizziness, Light Headedness, Fatigue, +Generalized Weakness HEENT: No Headaches, Difficulty Swallowing,Tooth/Dental Problems,Sore Throat,  No Sneezing, Rhinitis, Ear Ache, Nasal Congestion, or Post Nasal Drip,  Cardio-vascular:  +Chest pain, Orthopnea, PND, Edema in Lower Extremities, Anasarca, Dizziness, Palpitations  Resp: No Dyspnea, No DOE, No Productive Cough, No Non-Productive Cough, No Hemoptysis, No Wheezing.    GI: No Heartburn, Indigestion, Abdominal Pain, Nausea, Vomiting, Diarrhea, Constipation, Hematemesis, Hematochezia, Melena, Change in Bowel Habits,  Loss of Appetite  GU: No Dysuria, No Change in Color of Urine, No Urgency or Urinary Frequency, No Flank pain.  Musculoskeletal: No Joint Pain or Swelling, No Decreased Range of Motion, No Back Pain.  Neurologic: No Syncope, No Seizures, Muscle Weakness, Paresthesia, Vision Disturbance or Loss, No Diplopia, No Vertigo, No Difficulty Walking,  Skin: No Rash or Lesions. Psych: No Change in Mood or Affect, No Depression or Anxiety, No Memory loss, No Confusion, or Hallucinations   Past Medical History  Diagnosis Date  . Depression   . Seizures (HCC)     . Allergy   . Thyroid disease   . Traumatic brain injury [facility])      History reviewed. No pertinent past surgical history.    Prior to Admission medications   Medication Sig Start Date End Date Taking? Authorizing Provider  desvenlafaxine (PRISTIQ) 100 MG 24 hr tablet Take 100 mg by mouth daily.    Yes Historical Provider, MD  divalproex (DEPAKOTE) 500 MG DR tablet Take 1 tablet in the morning and 2 tablets at night. 05/05/15  Yes Carmen Dohmeier, MD  ibuprofen (ADVIL,MOTRIN) 200 MG tablet Take 200 mg by mouth daily.   Yes Historical Provider, MD  lamoTRIgine (LAMICTAL) 100 MG tablet TAKE 2 TABLET BY MOUTH AT BEDTIME Patient taking differently: TAKE 1 TABLET BY MOUTH AT BEDTIME 06/03/15  Yes Carmen Dohmeier, MD  levothyroxine (SYNTHROID, LEVOTHROID) 75 MCG tablet Take 75 mcg by mouth daily.   Yes Historical Provider, MD  meclizine (ANTIVERT) 25 MG tablet Take 1 tablet (25 mg total) by mouth 3 (three) times daily as needed for dizziness. 07/11/15   Chase Picket Ward, PA-C  ondansetron (ZOFRAN) 4 MG tablet Take 1 tablet (4 mg total) by mouth every 8 (eight) hours as needed for nausea or vomiting. 07/11/15   Chase Picket Ward, PA-C     Allergies  Allergen Reactions  . Penicillins Hives    Has patient had a PCN reaction causing immediate rash, facial/tongue/throat swelling, SOB or lightheadedness with hypotension:  Has patient had a PCN reaction causing severe rash involving mucus membranes or skin necrosis: Yes Has patient had a PCN reaction that required hospitalization No Has  patient had a PCN reaction occurring within the last 10 years: Yes If all of the above answers are "NO", then may proceed with Cephalosporin use.   . Sulfa Antibiotics Other (See Comments)    violently ill    Social History:  reports that she has never smoked. She does not have any smokeless tobacco history on file. She reports that she does not drink alcohol or use illicit drugs.    History reviewed. No  pertinent family history.     Physical Exam:  GEN:  Pleasant  65 y.o. female examined and in no acute distress; cooperative with exam Filed Vitals:   07/10/15 2240 07/10/15 2247 07/11/15 0135  BP: 146/65  144/75  Pulse: 73  76  Temp: 98.9 F (37.2 C)    TempSrc: Oral    Resp: 18  18  SpO2: 95% 98% 100%   Blood pressure 144/75, pulse 76, temperature 98.9 F (37.2 C), temperature source Oral, resp. rate 18, SpO2 100 %. PSYCH: SHe is alert and oriented x4; does not appear anxious does not appear depressed; affect is normal HEENT: Normocephalic and Atraumatic, Mucous membranes pink; PERRLA; EOM intact; Fundi:  Benign;  No scleral icterus, Nares: Patent, Oropharynx: Clear, Edentulous or Fair Dentition,    Neck:  FROM, No Cervical Lymphadenopathy nor Thyromegaly or Carotid Bruit; No JVD; Breasts:: Not examined CHEST WALL: No tenderness CHEST: Normal respiration, clear to auscultation bilaterally HEART: Regular rate and rhythm; no murmurs rubs or gallops BACK: No kyphosis or scoliosis; No CVA tenderness ABDOMEN: Positive Bowel Sounds, Scaphoid, Obese, Soft Non-Tender, No Rebound or Guarding; No Masses, No Organomegaly, No Pannus; No Intertriginous candida. Rectal Exam: Not done EXTREMITIES: No Bone or Joint Deformity; Age-Appropriate Arthropathy of the Hands and knees; No Cyanosis, Clubbing, or Edema; No Ulcerations. Genitalia: not examined PULSES: 2+ and symmetric SKIN: Normal hydration no rash or ulceration  CNS:  Alert and Oriented x 4, No Focal Deficits Mental Status:  Alert, Oriented, Thought Content Appropriate. Speech Fluent without evidence of Aphasia. Able to follow 3 step commands without difficulty.  In No obvious pain.   Cranial Nerves:  II: Discs flat bilaterally; Visual fields Intact, or Decreased peripheral vision to the left or right. Pupils equal and reactive.    III,IV, VI: Extra-ocular motions intact bilaterally    V,VII: smile symmetric, facial light touch sensation  normal bilaterally    VIII: hearing intact or decreasesd bilaterally    IX,X: gag reflex present    XI: bilateral shoulder shrug    XII: midline tongue extension   Motor:  Right:  Upper extremity 5/5     Left:  Upper extremity 5/5     Right:  Lower extremity 5/5    Left:  Lower extremity 5/5     Tone and Bulk:  normal tone throughout; no atrophy noted   Sensory:  Pinprick and light touch intact throughout, bilaterally   Deep Tendon Reflexes: 2+ and symmetric throughout   Plantars/ Babinski:  Right: equivocal or upgoing or normal Left: equivocal or upgoing or normal    Cerebellar:  Finger to nose with or without difficulty.   Gait: deferred    Vascular: pulses palpable throughout    Labs on Admission:  Basic Metabolic Panel:  Recent Labs Lab 07/08/15 1138 07/11/15 0118  NA 133* 125*  K 4.7 4.0  CL 98* 89*  CO2 29 25  GLUCOSE 85 125*  BUN 16 16  CREATININE 0.66 0.56  CALCIUM 8.8* 8.7*   Liver Function Tests:  Recent Labs Lab 07/11/15 0118  AST 19  ALT 9*  ALKPHOS 44  BILITOT 0.6  PROT 6.3*  ALBUMIN 3.5    Recent Labs Lab 07/11/15 0118  LIPASE 23   No results for input(s): AMMONIA in the last 168 hours. CBC:  Recent Labs Lab 07/08/15 1138 07/11/15 0118  WBC 5.8 6.7  NEUTROABS  --  5.8  HGB 12.2 13.8  HCT 36.3 39.4  MCV 93.1 90.6  PLT 166 166   Cardiac Enzymes: No results for input(s): CKTOTAL, CKMB, CKMBINDEX, TROPONINI in the last 168 hours.  BNP (last 3 results) No results for input(s): BNP in the last 8760 hours.  ProBNP (last 3 results) No results for input(s): PROBNP in the last 8760 hours.  CBG:  Recent Labs Lab 07/08/15 1140  GLUCAP 83    Radiological Exams on Admission: No results found.   EKG: Independently reviewed.         Assessment/Plan:   65 y.o. female with  Principal Problem:    1.    Hyponatremia    IVFs with NSS    Check Urine OSM and Urine Electrolytes   Active Problems:   2.    Chest  pain    Cardiac Monitoring    Cycle Troponins    IV Protonix      3.    Nausea and vomiting    PRN IV Zofran      4.    Seizure Disorder    Continue Keppra Rx      5.    Hypothyroid    Check TSH    Continue Levothyroxine Rx      6.    History of Alcohol    CIWA PRotocol    7.    DVT Prophylaxis    Lovenox      Code Status:     FULL CODE   Family Communication:    No Family Present    Disposition Plan:   Observation Status with Expected LOS 1-2 days  Time spent:  70 Minutes      Shannon Barry C [facility] Pager [phone]   If 7AM -7PM Please Contact the Day Rounding Team MD for [facility]  If 7PM-7AM, Please Contact Night-Floor Coverage  [email or web address] Password [email or web address] 07/11/2015, 2:42 AM     ADDENDUM:   Patient was seen and examined on 07/11/2015

## 2015-07-12 DIAGNOSIS — N390 Urinary tract infection, site not specified: Principal | ICD-10-CM

## 2015-07-12 DIAGNOSIS — D696 Thrombocytopenia, unspecified: Secondary | ICD-10-CM

## 2015-07-12 DIAGNOSIS — R112 Nausea with vomiting, unspecified: Secondary | ICD-10-CM

## 2015-07-12 DIAGNOSIS — E871 Hypo-osmolality and hyponatremia: Secondary | ICD-10-CM

## 2015-07-12 LAB — COMPREHENSIVE METABOLIC PANEL
ALBUMIN: 2.7 g/dL — AB (ref 3.5–5.0)
ALK PHOS: 36 U/L — AB (ref 38–126)
ALT: 8 U/L — AB (ref 14–54)
AST: 14 U/L — ABNORMAL LOW (ref 15–41)
Anion gap: 7 (ref 5–15)
BUN: 9 mg/dL (ref 6–20)
CALCIUM: 8.5 mg/dL — AB (ref 8.9–10.3)
CHLORIDE: 105 mmol/L (ref 101–111)
CO2: 24 mmol/L (ref 22–32)
CREATININE: 0.56 mg/dL (ref 0.44–1.00)
GFR calc Af Amer: >60 mL/min (ref >60)
Glucose, Bld: 77 mg/dL (ref 65–99)
Potassium: 4.3 mmol/L (ref 3.5–5.1)
SODIUM: 136 mmol/L (ref 135–145)
Total Bilirubin: 0.5 mg/dL (ref 0.3–1.2)
Total Protein: 5 g/dL — ABNORMAL LOW (ref 6.5–8.1)

## 2015-07-12 LAB — URINE CULTURE

## 2015-07-12 LAB — TSH: TSH: 8.153 u[IU]/mL — ABNORMAL HIGH (ref 0.350–4.500)

## 2015-07-12 LAB — CBC
HCT: 35.6 % — ABNORMAL LOW (ref 36.0–46.0)
HEMOGLOBIN: 11.9 g/dL — AB (ref 12.0–15.0)
MCH: 31.2 pg (ref 26.0–34.0)
MCHC: 33.4 g/dL (ref 30.0–36.0)
MCV: 93.4 fL (ref 78.0–100.0)
Platelets: 149 10*3/uL — ABNORMAL LOW (ref 150–400)
RBC: 3.81 MIL/uL — AB (ref 3.87–5.11)
RDW: 13.2 % (ref 11.5–15.5)
WBC: 4.3 10*3/uL (ref 4.0–10.5)

## 2015-07-12 NOTE — Evaluation (Signed)
Physical Therapy Evaluation Patient Details Name: Shannon Barry MRN: [id number] DOB: 10/03/1950 Today's Date: 07/12/2015   History of Present Illness  65 yo female admitted with hyponatremia, UTI, weakness. Hx of Sz, depression, TBI, vertigo. Pt is from [facility]  Clinical Impression  On eval, pt required Min assist for mobility-walked ~140 feet with RW. LOB multiple times during session requiring external assist to prevent fall. Some improvement with use of walker but remains unsteady. Pt reports visual disturbances as well. Noticed pt bumping into objects in path and/or missing targets when reaching for things (paper towels, grab bar in bathroom). Recommend ST rehab at SNF at this time. No family present during session. Pt reports she is from [facility]. Do not feel she could safely manage at home alone at this time.     Follow Up Recommendations SNF;Supervision/Assistance - 24 hour (if pt/family agreeable. )    Equipment Recommendations  Rolling walker with 5" wheels (if pt doesn't already have one)    Recommendations for Other Services       Precautions / Restrictions Precautions Precautions: Fall Precaution Comments: sz prec Restrictions Weight Bearing Restrictions: No      Mobility  Bed Mobility Overal bed mobility: Needs Assistance Bed Mobility: Supine to Sit     Supine to sit: Supervision     General bed mobility comments: for safety  Transfers Overall transfer level: Needs assistance   Transfers: Sit to/from Stand Sit to Stand: Min assist         General transfer comment: assist to rise, stabilize, control descent. LOB x 2 (standing from bed, standing from commode)  Ambulation/Gait Ambulation/Gait assistance: Min assist Ambulation Distance (Feet): 140 Feet Assistive device: Rolling walker (2 wheeled);None Gait Pattern/deviations: Step-through pattern;Decreased stride length     General Gait Details: walked ~15 feet to bathroom without  device-very unsteady with LOB. walked in hallway with RW-Min assist to stabilize and maneuver with RW. Pt occasionally bumped into door frame or other objects in hallway  Stairs            Wheelchair Mobility    Modified Rankin (Stroke Patients Only)       Balance Overall balance assessment: Needs assistance         Standing balance support: Bilateral upper extremity supported Standing balance-Leahy Scale: Poor Standing balance comment: requires external support                             Pertinent Vitals/Pain Pain Assessment: No/denies pain    Home Living Family/patient expects to be discharged to:: Private residence Living Arrangements: Alone   Type of Home: Independent living facility       Home Layout: One level Home Equipment: Cane - single point;Walker - 2 wheels      Prior Function Level of Independence: Independent               Hand Dominance        Extremity/Trunk Assessment   Upper Extremity Assessment: Generalized weakness           Lower Extremity Assessment: Generalized weakness      Cervical / Trunk Assessment: Kyphotic  Communication   Communication: No difficulties  Cognition Arousal/Alertness: Awake/alert Behavior During Therapy: WFL for tasks assessed/performed Overall Cognitive Status: No family/caregiver present to determine baseline cognitive functioning (pt upset about family saying they may have to take her dog away) Area of Impairment: Safety/judgement;Problem solving  Safety/Judgement: Decreased awareness of deficits;Decreased awareness of safety   Problem Solving: Requires verbal cues;Requires tactile cues      General Comments      Exercises        Assessment/Plan    PT Assessment Patient needs continued PT services  PT Diagnosis Difficulty walking;Generalized weakness   PT Problem List Decreased balance;Decreased mobility;Decreased safety awareness;Decreased knowledge of  use of DME  PT Treatment Interventions DME instruction;Gait training;Functional mobility training;Therapeutic activities;Patient/family education;Balance training;Therapeutic exercise   PT Goals (Current goals can be found in the Care Plan section) Acute Rehab PT Goals Patient Stated Goal: to return to apt and to be able to keep her dog PT Goal Formulation: With patient Time For Goal Achievement: 07/26/15 Potential to Achieve Goals: Good    Frequency Min 3X/week   Barriers to discharge        Co-evaluation               End of Session Equipment Utilized During Treatment: Gait belt Activity Tolerance: Patient tolerated treatment well Patient left: in chair;with call bell/phone within reach;with chair alarm set           Time: 1610-9604 PT Time Calculation (min) (ACUTE ONLY): 22 min   Charges:   PT Evaluation $PT Eval Moderate Complexity: 1 Procedure     PT G Codes:        Rebeca Alert, MPT Pager: [phone]

## 2015-07-12 NOTE — Progress Notes (Signed)
Patient ID: Shannon Barry, female   DOB: [birth year], 65 y.o.   MRN: [id number] [facility] PROGRESS NOTE  Haru Anspaugh WUJ:[id number] DOB: 1951-04-27 DOA: 07/10/2015 PCP: Elmarie Mainland, NP  Brief narrative:    65 y.o. female with a past medical history of TBI, seizures and hypothyroidism who presented to the ED with complaints of nausea, vomiting and dizziness for 3 days.She was found to have hyponatremia and UTI on admission. Her son is concerned about the pt overall gait instability.  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  Family Communication:  plan of care discussed with the patient's son over the phone  Disposition Plan: To SNF likely by 2/14  IV access:  Peripheral IV  Procedures and diagnostic studies:    Dg Chest [facility] 07/28/15  No active disease. Hyperinflation.   Medical Consultants:  None   Other Consultants:  PT  IAnti-Infectives:   Rocephin 07/10/2015 -->   Manson Passey, MD  [facility] Pager [phone]  Time spent in minutes: 25 minutes  If 7PM-7AM, please contact night-coverage [email or web address] Password [facility] 07/12/2015, 2:38 PM   LOS: 1 day    HPI/Subjective: No acute overnight events. Patient reports she feels better.   Objective: Filed Vitals:   07-28-15 2021 07/12/15 0206 07/12/15 0559 07/12/15 1024  BP: 131/59 125/54  118/61 126/63  Pulse: 73 78 74 70  Temp: 98 F (36.7 C) 98.2 F (36.8 C) 97.9 F (36.6 C) 98 F (36.7 C)  TempSrc: Oral Oral Oral Oral  Resp: Height:      Weight:      SpO2: 98% 95% 96% 98%    Intake/Output Summary (Last 24 hours) at 07/12/15 1438 Last data filed at 07/12/15 0953  Gross per 24 hour  Intake    710 ml  Output      0 ml  Net    710 ml    Exam:   General:  Pt is alert, not in acute distress  Cardiovascular: Regular rate and rhythm, S1/S2 (+)  Respiratory: no wheezing, no crackles, no rhonchi  Abdomen: Soft, non tender, non distended, bowel sounds present  Extremities: No edema, pulses DP and PT palpable bilaterally  Neuro: Grossly nonfocal  Data Reviewed: Basic Metabolic Panel:  Recent Labs Lab 07/08/15 1138 Jul 28, 2015 0118 July 28, 2015 0516 07/12/15 0526  NA 133* 125* 127* 136  K 4.7 4.0 4.8 4.3  CL 98* 89* 91* 105  CO2 GLUCOSE 85 125* 119* 77  BUN CREATININE 0.66 0.56 0.56 0.56  CALCIUM 8.8* 8.7* 9.1 8.5*   Liver Function Tests:  Recent Labs Lab 07/28/15 0118 07/12/15 0526  AST 19 14*  ALT 9* 8*  ALKPHOS 44 36*  BILITOT 0.6 0.5  PROT 6.3* 5.0*  ALBUMIN 3.5 2.7*    Recent Labs Lab 28-Jul-2015  0118  LIPASE 23   No results for input(s): AMMONIA in the last 168 hours. CBC:  Recent Labs Lab 07/08/15 1138 07/11/15 0118 07/11/15 0516 07/12/15 0526  WBC 5.8 6.7 5.2 4.3  NEUTROABS  --  5.8  --   --   HGB 12.2 13.8 13.0 11.9*  HCT 36.3 39.4 36.3 35.6*  MCV 93.1 90.6 88.8 93.4  PLT 166 166 165 149*   Cardiac Enzymes:  Recent Labs Lab 07/11/15 0516 07/11/15 1128 07/11/15 1854  TROPONINI <0.03 <0.03 <0.03   BNP: Invalid input(s): POCBNP CBG:  Recent Labs Lab 07/08/15 1140  GLUCAP 83    Recent Results (from the past 240 hour(s))  Urine culture     Status: None   Collection Time: 07/08/15 11:56 AM  Result Value Ref Range Status   Specimen Description URINE, CLEAN CATCH  Final    Special Requests NONE  Final   Culture   Final    MULTIPLE SPECIES PRESENT, SUGGEST RECOLLECTION Performed at [facility]    Report Status 07/09/2015 FINAL  Final  Urine culture     Status: None   Collection Time: 07/10/15 11:52 PM  Result Value Ref Range Status   Specimen Description URINE, CLEAN CATCH  Final   Special Requests NONE  Final   Culture   Final    MULTIPLE SPECIES PRESENT, SUGGEST RECOLLECTION Performed at [facility]    Report Status 07/12/2015 FINAL  Final     Scheduled Meds: . cefTRIAXone (ROCEPHIN)  IV  1 g Intravenous Q24H  . divalproex  500 mg Oral Q12H  . lamoTRIgine  100 mg Oral QHS  . levothyroxine  75 mcg Oral Daily  . sodium chloride flush  3 mL Intravenous Q12H  . venlafaxine XR  150 mg Oral Q breakfast   Continuous Infusions: . sodium chloride 50 mL/hr at 07/12/15 [phone]

## 2015-07-13 ENCOUNTER — Telehealth: Payer: Self-pay | Admitting: Neurology

## 2015-07-13 DIAGNOSIS — R569 Unspecified convulsions: Secondary | ICD-10-CM

## 2015-07-13 MED ORDER — DOCUSATE SODIUM 100 MG PO CAPS
100.0000 mg | ORAL_CAPSULE | Freq: Two times a day (BID) | ORAL | Status: DC
  Administered 2015-07-13 – 2015-07-15 (×4): 100 mg via ORAL
  Filled 2015-07-13 (×4): qty 1

## 2015-07-13 NOTE — Telephone Encounter (Signed)
Spoke to pt's son (emergency contact). He states that pt is currently admitted to [facility] for a seizure and perhaps vertigo. He states that her "recovery" time is prolonged and they are unsure why. A SNF was even mentioned to the family. Pt's husband, who is a physician, is recommending that the family speak to Dr. Vickey Huger about what she thinks is going on.  Pt's son is requesting a call back from Dr. Vickey Huger to discuss.

## 2015-07-13 NOTE — Progress Notes (Signed)
CSW continuing to follow.   Pt currently does not have any bed offers from SNF search. Likely due to pt insurance.  CSW discussed with pt son who plans to contact pt insurance company to inquire which facilities are in network and get further information regarding coverage. Pt son asked CSW to expand search in instance that insurance may be in network with a facility in a surrounding county.   CSW expanded SNF search and pt son will contact CSW with information that pt son receives from discussion with insurance company since insurance appears to be barrier at this time.  CSW to continue to follow.  Loletta Specter, MSW, LCSW Clinical Social Work [phone]

## 2015-07-13 NOTE — Clinical Social Work Note (Signed)
Clinical Social Work Assessment  Patient Details  Name: Shannon Barry MRN: [id number] Date of Birth: [birth year]  Date of referral:  07/13/15               Reason for consult:  Discharge Planning                Permission sought to share information with:  Family Supports Permission granted to share information::  Yes, Verbal Permission Granted  Name::     Shannon Barry  Agency::     Relationship::  son  Contact Information:  [phone]  Housing/Transportation Living arrangements for the past 2 months:  [place] of Information:  Patient Patient Interpreter Needed:    Criminal Activity/Legal Involvement Pertinent to Current Situation/Hospitalization:    Significant Relationships:  Adult Children Lives with:  Facility Resident (Admitted from [place]) Do you feel safe going back to the place where you live?  No Need for family participation in patient care:  Yes (Comment)  Care giving concerns:  Pt admitted from [place] [facility] [place]. PT recommending ST SNF.   Social Worker assessment / plan:  CSW received referral for New SNF.   CSW met with pt at bedside. CSW introduced self and explained role. Pt reports that she resides at [place]. CSW discussed recommendation for ST SNF for rehab. Pt expressed that she feels that rehab will be beneficial, but pt wants to involve pt son, [name] in discharge planning. CSW contacted pt son, [name] via telephone while present in pt room. CSW discussed recommendation for ST rehab. Pt son agrees that rehab for short time will benefit pt before return to [facility]. CSW clarified questions and concerns. Pt son is concerned about pt insurance and how pt insurance will cover rehab at [facility]. CSW discussed that completing SNF search will allow for facilities to review pt benefits and notify CSW of coverage.   CSW completed FL2 and initiated SNF search to [facility]. CSW to follow up with pt and pt son  regarding SNF bed offers. Pt has [employer] and CSW unsure of insurance and if it will require authorization. Will get further clarification once facilities respond to search.  CSW to continue to follow to provide support and assist with pt discharge planning needs.   Employment status:  Disabled (Comment on whether or not currently receiving Disability) Insurance information:  Managed Care PT Recommendations:  [place] / Referral to community resources:  [place]  Patient/Family's Response to care:  Pt alert and oriented x 4. Pt son supportive and actively involved in pt care. Pt and pt son feel rehab would be beneficial as pt lives alone in [place] apartment at [employer]. Will f/u with options with pt and pt son.   Patient/Family's Understanding of and Emotional Response to Diagnosis, Current Treatment, and Prognosis:  Pt son asked about neurology consult and CSW discussed that per MD note, neurology consulted. Pt son reports that he had call into pt neurologist as well.   Emotional Assessment Appearance:  Appears stated age Attitude/Demeanor/Rapport:  Other (cooperative) Affect (typically observed):  Appropriate Orientation:  Oriented to Self, Oriented to Place, Oriented to  Time, Oriented to Situation Alcohol / Substance use:  Not Applicable Psych involvement (Current and /or in the community):  No (Comment)  Discharge Needs  Concerns to be addressed:  Discharge Planning Concerns Readmission within the last 30 days:  No Current discharge risk:    Barriers to Discharge:  Ship broker,  Continued Medical Work up   Ladell Pier, [place] 07/13/2015, 2:30 PM  [phone]

## 2015-07-13 NOTE — Telephone Encounter (Signed)
Patient's son is calling and states his mother just had a seizure and is in the hospital and it has been suggested that his mother go to skilled nursing at release. He does not feel that the hospital is taking the neurological aspect of her health.  Please advise.

## 2015-07-13 NOTE — Clinical Social Work Placement (Signed)
   CLINICAL SOCIAL WORK PLACEMENT  NOTE  Date:  07/13/2015  Patient Details  Name: Shannon Barry MRN: [id number] Date of Birth: 11/25/1950  Clinical Social Work is seeking post-discharge placement for this patient at the Skilled  Nursing Facility level of care (*CSW will initial, date and re-position this form in  chart as items are completed):  Yes   Patient/family provided with [facility] Clinical Social Work Department's list of facilities offering this level of care within the geographic area requested by the patient (or if unable, by the patient's family).  Yes   Patient/family informed of their freedom to choose among providers that offer the needed level of care, that participate in Medicare, Medicaid or managed care program needed by the patient, have an available bed and are willing to accept the patient.  Yes   Patient/family informed of [facility]'s ownership interest in [facility] and [facility], as well as of the fact that they are under no obligation to receive care at these facilities.  PASRR submitted to EDS on 07/13/15     PASRR number received on 07/13/15     Existing PASRR number confirmed on       FL2 transmitted to all facilities in geographic area requested by pt/family on 07/13/15     FL2 transmitted to all facilities within larger geographic area on       Patient informed that his/her managed care company has contracts with or will negotiate with certain facilities, including the following:            Patient/family informed of bed offers received.  Patient chooses bed at       Physician recommends and patient chooses bed at      Patient to be transferred to   on  .  Patient to be transferred to facility by       Patient family notified on   of transfer.  Name of family member notified:        PHYSICIAN Please sign FL2 (pt has [employer] which may require authorization)     Additional Comment:     _______________________________________________ Orson Eva, LCSW 07/13/2015, 2:46 PM

## 2015-07-13 NOTE — Telephone Encounter (Signed)
Cannot reach the son of the patient, left message under the phone number entered into EPIC as her contact number.

## 2015-07-13 NOTE — Telephone Encounter (Signed)
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  I will speak to him over lunch . CD

## 2015-07-13 NOTE — NC FL2 (Signed)
[place] MEDICAID FL2 LEVEL OF CARE SCREENING TOOL     IDENTIFICATION  Patient Name: Shannon Barry Birthdate: [birth year] Sex: female Admission Date (Current Location): 07/10/2015  [facility] and [place] Number:  Producer, television/film/video and Address:  [facility],  [address] [place] [address]      Provider Number: [id number]  Attending Physician Name and Address:  Alison Murray, MD  Relative Name and Phone Number:       Current Level of Care: Hospital Recommended Level of Care: Skilled Nursing Facility Prior Approval Number:    Date Approved/Denied:   PASRR Number:    Discharge Plan: SNF    Current Diagnoses: Patient Active Problem List   Diagnosis Date Noted  . Hyponatremia 07/11/2015  . Chest pain 07/11/2015  . Nausea and vomiting 07/11/2015  . Seizures (HCC) 07/11/2015  . Vertigo   . 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 TBI (traumatic brain injury) (HCC) 05/04/2015  . 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 Dementia with behavioral disturbance 03/18/2015  . 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 (traumatic brain injury) 03/18/2015    Orientation RESPIRATION BLADDER Height & Weight     Self, Time, Situation, Place  Normal Continent Weight: 129 lb 10.1 oz (58.8 kg) Height:   (160 cm)  BEHAVIORAL SYMPTOMS/MOOD NEUROLOGICAL BOWEL NUTRITION STATUS   (n/a) Convulsions/Seizures (hx of Post traumatic seizures, no seizures reported in hospital) Continent Diet (Diet Heart)  AMBULATORY STATUS COMMUNICATION OF NEEDS Skin   Limited Assist Verbally Normal                       Personal Care Assistance Level of Assistance  Bathing, Feeding, Dressing Bathing Assistance: Limited assistance Feeding assistance: Independent Dressing Assistance: Limited assistance     Functional Limitations Info  Sight, Hearing, Speech Sight Info: Impaired (wears glasses) Hearing Info:  Adequate Speech Info: Adequate    SPECIAL CARE FACTORS FREQUENCY  PT (By licensed PT), OT (By licensed OT)     PT Frequency: 5 x a week OT Frequency: 5 x a week            Contractures Contractures Info: Not present    Additional Factors Info  Code Status Code Status Info: FULL code status             Current Medications (07/13/2015):  This is the current hospital active medication list Current Facility-Administered Medications  Medication Dose Route Frequency Provider Last Rate Last Dose  . 0.9 %  sodium chloride infusion   Intravenous Continuous Alison Murray, MD 50 mL/hr at 07/12/15 1608    . acetaminophen (TYLENOL) tablet 650 mg  650 mg Oral Q6H PRN Ron Parker, MD       Or  . acetaminophen (TYLENOL) suppository 650 mg  650 mg Rectal Q6H PRN Ron Parker, MD      . alum & mag hydroxide-simeth (MAALOX/MYLANTA) 200-200-20 MG/5ML suspension 30 mL  30 mL Oral Q6H PRN Ron Parker, MD      . cefTRIAXone (ROCEPHIN) 1 g in dextrose 5 % 50 mL IVPB  1 g Intravenous Q24H Richarda Overlie, MD   1 g at 07/13/15 0912  . divalproex (DEPAKOTE) DR tablet 500 mg  500 mg Oral Q12H Ron Parker, MD   500 mg at 07/13/15 0912  . HYDROmorphone (DILAUDID) injection 0.5-1 mg  0.5-1 mg Intravenous Q3H PRN Ron Parker, MD      .  lamoTRIgine (LAMICTAL) tablet 100 mg  100 mg Oral QHS Ron Parker, MD   100 mg at 07/12/15 2224  . levothyroxine (SYNTHROID, LEVOTHROID) tablet 75 mcg  75 mcg Oral Daily Ron Parker, MD   75 mcg at 07/13/15 [phone]  . ondansetron (ZOFRAN) tablet 4 mg  4 mg Oral Q6H PRN Ron Parker, MD       Or  . ondansetron (ZOFRAN) injection 4 mg  4 mg Intravenous Q6H PRN Ron Parker, MD      . oxyCODONE (Oxy IR/ROXICODONE) immediate release tablet 5 mg  5 mg Oral Q4H PRN Ron Parker, MD      . sodium chloride flush (NS) 0.9 % injection 3 mL  3 mL Intravenous Q12H Ron Parker, MD   3 mL at 07/12/15 0954  . venlafaxine  XR (EFFEXOR-XR) 24 hr capsule 150 mg  150 mg Oral Q breakfast Ron Parker, MD   150 mg at 07/13/15 4782     Discharge Medications: Please see discharge summary for a list of discharge medications.  Relevant Imaging Results:  Relevant Lab Results:   Additional Information SSN: [id number]  Yamile Roedl, Selena Lesser A, LCSW

## 2015-07-13 NOTE — Progress Notes (Addendum)
Patient ID: Shannon Barry, female   DOB: February 21, 1951, 65 y.o.   MRN: [id number] [facility] PROGRESS NOTE  Shannon Barry [id number]:[id number] DOB: Sep 13, 1950 DOA: 07/10/2015 PCP: Elmarie Mainland, NP  Brief narrative:    65 y.o. female with a past medical history of TBI, seizures and hypothyroidism who presented to the ED with complaints of nausea, vomiting and dizziness for 3 days.She was found to have hyponatremia and UTI on admission. Her son is concerned about the pt overall gait instability.  Also note, spoke with patient's husband 07/12/2015 who is expressing his concern about the patient's mental status changes. Apparently patient had an episode of mental status change back in summer of 2016, another episode in October 2016 and then again on this admission. Her mental status changes may last for couple of days and initially the thought was that these are post ictal episodes however per her husband who is a physician he does not believe that this is post ictal mental status change because it is lasting too long, for example for couple of days. At baseline she is relatively functional however considering her degree of TBI and previous seizures she does require assistance with some activities of daily living. She is doing quite okay in independent living facility so her gait issues are obviously of a high concern to him. He is in agreement that patient goes to rehabilitation or skilled nursing facility whatever the recommendation once she is stable for discharge.  Assessment/Plan:    Principal Problem: Nausea, vomiting / UTI - N/V likely due to acute infection, UTI - UA on admission with small leukocytes - Urine culture with multiple species none predominant - We'll stop Rocephin today.  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  Family Communication:  plan of care discussed with the patient's son and her husband over the phone  Disposition Plan: To SNF likely by 2/14  IV access:  Peripheral IV  Procedures and diagnostic studies:    Dg Chest [facility] 08-06-2015  No active disease. Hyperinflation.   Medical Consultants:  Neurology   Other Consultants:  PT  IAnti-Infectives:   Rocephin 07/10/2015 --> 07/13/2015   Manson Passey, MD  [facility] Pager [phone]  Time spent in minutes: 25 minutes  If 7PM-7AM, please contact night-coverage [email or web address] Password [facility] 07/13/2015, 11:25 AM   LOS: 2 days    HPI/Subjective: No acute overnight events. Patient reports no nausea or vomiting.  Objective: Filed Vitals:   07/12/15 1024 07/12/15 1451 07/12/15 2142 07/13/15 0608  BP: 126/63 119/70 135/71 135/69  Pulse: 70 77 74 62  Temp: 98 F (36.7 C) 97.5 F (36.4 C) 98.5 F (36.9 C) 97.6 F (36.4 C)  TempSrc: Oral Oral Oral Oral  Resp: Height:      Weight:      SpO2: 98% 99% 94% 98%    Intake/Output Summary (Last 24 hours) at 07/13/15 1125 Last data filed at 07/13/15 0700  Gross per 24 hour  Intake 1797.5 ml  Output  0 ml  Net 1797.5 ml    Exam:   General:  Pt is not in acute distress  Cardiovascular: RRR, S1/S2 appreciated   Respiratory: Bilateral air entry, no wheezing  Abdomen: Appreciate bowel sounds, nontender  Extremities: No swelling, pulses palpable  Neuro: Nonfocal  Data Reviewed: Basic Metabolic Panel:  Recent Labs Lab 07/08/15 1138 07/11/15 0118 07/11/15 0516 07/12/15 0526  NA 133* 125* 127* 136  K 4.7 4.0 4.8 4.3  CL 98* 89*  91* 105  CO2 GLUCOSE 85 125* 119* 77  BUN CREATININE 0.66 0.56 0.56 0.56  CALCIUM 8.8* 8.7* 9.1 8.5*   Liver Function Tests:  Recent Labs Lab 07/11/15 0118 07/12/15 0526  AST 19 14*  ALT 9* 8*  ALKPHOS 44 36*  BILITOT 0.6 0.5  PROT 6.3* 5.0*  ALBUMIN 3.5 2.7*    Recent Labs Lab 07/11/15 0118  LIPASE 23   No results for input(s): AMMONIA in the last 168 hours. CBC:  Recent Labs Lab 07/08/15 1138 07/11/15 0118 07/11/15 0516 07/12/15 0526  WBC 5.8 6.7 5.2 4.3  NEUTROABS  --  5.8  --   --   HGB 12.2 13.8 13.0 11.9*  HCT 36.3 39.4 36.3 35.6*  MCV 93.1 90.6 88.8 93.4  PLT 166 166 165 149*   Cardiac Enzymes:  Recent Labs Lab 07/11/15 0516 07/11/15 1128 07/11/15 1854  TROPONINI <0.03 <0.03 <0.03   BNP: Invalid input(s): POCBNP CBG:  Recent Labs Lab 07/08/15 1140  GLUCAP 83    Recent Results (from the past 240 hour(s))  Urine culture     Status: None   Collection Time: 07/08/15 11:56 AM  Result Value Ref Range Status   Specimen Description URINE, CLEAN CATCH  Final   Special Requests NONE  Final   Culture   Final    MULTIPLE SPECIES PRESENT, SUGGEST RECOLLECTION Performed at [facility]    Report Status 07/09/2015 FINAL  Final  Urine culture     Status: None   Collection Time: 07/10/15 11:52 PM  Result Value Ref Range Status   Specimen Description URINE, CLEAN CATCH  Final   Special Requests NONE  Final   Culture   Final    MULTIPLE SPECIES PRESENT, SUGGEST RECOLLECTION Performed at [facility]    Report Status 07/12/2015 FINAL  Final     Scheduled Meds: . cefTRIAXone (ROCEPHIN)  IV  1 g Intravenous Q24H  . divalproex  500 mg Oral Q12H  . lamoTRIgine  100 mg Oral QHS  . levothyroxine  75 mcg Oral Daily  . sodium chloride flush  3 mL Intravenous Q12H  . venlafaxine XR  150 mg Oral Q breakfast   Continuous Infusions: . sodium chloride 50 mL/hr at 07/12/15 1608

## 2015-07-13 NOTE — NC FL2 (Signed)
[place] MEDICAID FL2 LEVEL OF CARE SCREENING TOOL     IDENTIFICATION  Patient Name: Shannon Barry Birthdate: [birth year] Sex: female Admission Date (Current Location): 07/10/2015  [facility] and [place] Number:  Producer, television/film/video and Address:  [facility],  [address], [place] [address]      Provider Number: [id number]  Attending Physician Name and Address:  Alison Murray, MD  Relative Name and Phone Number:       Current Level of Care: Hospital Recommended Level of Care: Skilled Nursing Facility Prior Approval Number:    Date Approved/Denied:   PASRR Number: [id number]  Discharge Plan: SNF    Current Diagnoses: Patient Active Problem List   Diagnosis Date Noted  . Hyponatremia 07/11/2015  . Chest pain 07/11/2015  . Nausea and vomiting 07/11/2015  . Seizures (HCC) 07/11/2015  . Vertigo   . 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 TBI (traumatic brain injury) (HCC) 05/04/2015  . 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 Dementia with behavioral disturbance 03/18/2015  . 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 (traumatic brain injury) 03/18/2015    Orientation RESPIRATION BLADDER Height & Weight     Self, Time, Situation, Place  Normal Continent Weight: 129 lb 10.1 oz (58.8 kg) Height:   (160 cm)  BEHAVIORAL SYMPTOMS/MOOD NEUROLOGICAL BOWEL NUTRITION STATUS   (n/a) Convulsions/Seizures (hx of Post traumatic seizures, no seizures reported in hospital) Continent Diet (Diet Heart)  AMBULATORY STATUS COMMUNICATION OF NEEDS Skin   Limited Assist Verbally Normal                       Personal Care Assistance Level of Assistance  Bathing, Feeding, Dressing Bathing Assistance: Limited assistance Feeding assistance: Independent Dressing Assistance: Limited assistance     Functional Limitations Info  Sight, Hearing, Speech Sight Info: Impaired (wears  glasses) Hearing Info: Adequate Speech Info: Adequate    SPECIAL CARE FACTORS FREQUENCY  PT (By licensed PT), OT (By licensed OT)     PT Frequency: 5 x a week OT Frequency: 5 x a week            Contractures Contractures Info: Not present    Additional Factors Info  Code Status Code Status Info: FULL code status             Current Medications (07/13/2015):  This is the current hospital active medication list Current Facility-Administered Medications  Medication Dose Route Frequency Provider Last Rate Last Dose  . 0.9 %  sodium chloride infusion   Intravenous Continuous Alison Murray, MD 50 mL/hr at 07/12/15 1608    . acetaminophen (TYLENOL) tablet 650 mg  650 mg Oral Q6H PRN Ron Parker, MD       Or  . acetaminophen (TYLENOL) suppository 650 mg  650 mg Rectal Q6H PRN Ron Parker, MD      . alum & mag hydroxide-simeth (MAALOX/MYLANTA) 200-200-20 MG/5ML suspension 30 mL  30 mL Oral Q6H PRN Ron Parker, MD      . cefTRIAXone (ROCEPHIN) 1 g in dextrose 5 % 50 mL IVPB  1 g Intravenous Q24H Richarda Overlie, MD   1 g at 07/13/15 0912  . divalproex (DEPAKOTE) DR tablet 500 mg  500 mg Oral Q12H Ron Parker, MD   500 mg at 07/13/15 0912  . HYDROmorphone (DILAUDID) injection 0.5-1 mg  0.5-1 mg Intravenous Q3H PRN Ron Parker, MD      .  lamoTRIgine (LAMICTAL) tablet 100 mg  100 mg Oral QHS Ron Parker, MD   100 mg at 07/12/15 2224  . levothyroxine (SYNTHROID, LEVOTHROID) tablet 75 mcg  75 mcg Oral Daily Ron Parker, MD   75 mcg at 07/13/15 [phone]  . ondansetron (ZOFRAN) tablet 4 mg  4 mg Oral Q6H PRN Ron Parker, MD       Or  . ondansetron (ZOFRAN) injection 4 mg  4 mg Intravenous Q6H PRN Ron Parker, MD      . oxyCODONE (Oxy IR/ROXICODONE) immediate release tablet 5 mg  5 mg Oral Q4H PRN Ron Parker, MD      . sodium chloride flush (NS) 0.9 % injection 3 mL  3 mL Intravenous Q12H Ron Parker, MD   3 mL at  07/12/15 0954  . venlafaxine XR (EFFEXOR-XR) 24 hr capsule 150 mg  150 mg Oral Q breakfast Ron Parker, MD   150 mg at 07/13/15 9604     Discharge Medications: Please see discharge summary for a list of discharge medications.  Relevant Imaging Results:  Relevant Lab Results:   Additional Information SSN: [id number]  Khaniyah Bezek, Selena Lesser A, LCSW

## 2015-07-13 NOTE — Telephone Encounter (Signed)
Do you know sons 's phone number. ?  [phone]

## 2015-07-14 LAB — LAMOTRIGINE LEVEL: LAMOTRIGINE LVL: 9.3 ug/mL (ref 2.0–20.0)

## 2015-07-14 MED ORDER — VITAMINS A & D EX OINT
TOPICAL_OINTMENT | CUTANEOUS | Status: AC
  Administered 2015-07-14: 5
  Filled 2015-07-14: qty 5

## 2015-07-14 NOTE — Progress Notes (Signed)
Physical Therapy Treatment Patient Details Name: Shannon Barry MRN: [id number] DOB: [birth year] Today's Date: 07/14/2015    History of Present Illness 65 yo female admitted with hyponatremia, UTI, weakness. Hx of Sz, depression, TBI, vertigo. Pt is from [facility]    PT Comments    Progressing with mobility. Improved stability compared to last session however pt continues to require assistance. Impaired dynamic standing balance especially with direction changes and head turns. Continue to recommend ST rehab at [facility].   Follow Up Recommendations  SNF;Supervision/Assistance - 24 hour     Equipment Recommendations  Rolling walker with 5" wheels (py may not agree to it)    Recommendations for Other Services       Precautions / Restrictions Precautions Precautions: Fall Precaution Comments: sz prec Restrictions Weight Bearing Restrictions: No    Mobility  Bed Mobility               General bed mobility comments: oob in recliner  Transfers Overall transfer level: Needs assistance Equipment used: Rolling walker (2 wheeled);None Transfers: Sit to/from Stand Sit to Stand: Min assist         General transfer comment: assist to rise, stabilize, control descent. vcs safety  Ambulation/Gait Ambulation/Gait assistance: Min assist Ambulation Distance (Feet): 150 Feet (x2) Assistive device: None;Rolling walker (2 wheeled) Gait Pattern/deviations: Step-through pattern;Decreased stride length;Drifts right/left;Staggering right;Staggering left     General Gait Details: walked ~150 feet with RW-Min guard assist. walked ~150 feet without device-Min assist to stabilize intermittently.    Stairs            Wheelchair Mobility    Modified Rankin (Stroke Patients Only)       Balance                                    Cognition Arousal/Alertness: Awake/alert Behavior During Therapy: WFL for tasks assessed/performed   Area of Impairment:  Safety/judgement         Safety/Judgement: Decreased awareness of safety;Decreased awareness of deficits          Exercises      General Comments        Pertinent Vitals/Pain Pain Assessment: No/denies pain    Home Living                      Prior Function            PT Goals (current goals can now be found in the care plan section) Progress towards PT goals: Progressing toward goals    Frequency  Min 3X/week    PT Plan Current plan remains appropriate    Co-evaluation             End of Session Equipment Utilized During Treatment: Gait belt Activity Tolerance: Patient tolerated treatment well Patient left: in chair;with call bell/phone within reach;with chair alarm set     Time: 1000-1022 PT Time Calculation (min) (ACUTE ONLY): 22 min  Charges:  $Gait Training: 8-22 mins                    G Codes:      Rebeca Alert, MPT Pager: [phone]

## 2015-07-14 NOTE — Progress Notes (Signed)
CSW continuing to follow.   CSW spoke with pt son, Shannon Barry today regarding disposition planning. Pt son, Shannon Barry aware of limitations of SNF bed offers given pt insurance. Pt son reports that his father whom insurance plan is under is in contact with insurance company to determine if insurance will cover a local facility. CSW discussed with pt son that even if insurance states they are in contract with a facility in [place] that that facility would still have to be willing to accept pt. Pt son expressed understanding. CSW notified pt son of the four bed offers present which non are local facilities. Pt son does not wish for CSW to proceed in securing one of these facilities and having them initiate insurance authorization as he is hopeful his father can work something out with insurance. CSW discussed with pt son that MD plans to discharge pt tomorrow and if pt family does not agree to a SNF bed and there is not insurance authorization for pt to go to SNF then pt cannot remain in the hospital to await insurance authorization and pt family to be agreeable to a SNF and pt would have to return back to [facility] with HH. Pt son expressed understanding and states that he is hopeful that insurance will have an option locally.   CSW will await further guidance from pt son to assist with placement.  Loletta Specter, MSW, LCSW Clinical Social Work [phone]

## 2015-07-14 NOTE — Telephone Encounter (Signed)
Spoke to son, discussed rehab - SNF and rrehab in a nursing home facility such as [facility] before returning to [facility]. He will contact the hospital Neurologist to discuss the case with me. CD

## 2015-07-14 NOTE — Progress Notes (Signed)
CSW continuing to follow.   CSW received phone call from pt family re: disposition planning. CSW was also able to receive some information from a SNF in [place] that is out of network with pt insurance about insurance coverage. CSW shared with pt family the insurance coverage and anticipated out of pocket cost for SNF. CSW discussed that [facility] offers a program [facility] which can provide private duty care at [facility], but this is also an out of pocket cost and [facility] uses [facility] for PT/OT/ aide which can be billed to insurance, but unsure how insurance covers [facility] services. Pt family weighed options and pt family wishes for pt to return to [facility] and plan to arrange private duty care for pt through the [facility] program and agreeable to Home Health PT/OT/Aide. CSW provided pt family contact information for [facility].  CSW left message with [facility] coordinator, Almyra Deforest and CSW updated [facility] Wellsite geologist, [employer].  CSW notified RNCM of need for [facility] PT/OT/aide through [place].   CSW to continue to be available as needed, but plan for discharge is to discharge back to [facility] with private duty care and home health services.   Loletta Specter, MSW, LCSW Clinical Social Work [phone]

## 2015-07-14 NOTE — Progress Notes (Addendum)
Patient ID: Shannon Barry, female   DOB: 22-Nov-1950, 65 y.o.   MRN: [id number] [facility] PROGRESS NOTE  Shannon Barry WUJ:[id number] DOB: 04-Feb-1951 DOA: 07/10/2015 PCP: Elmarie Mainland, NP  Brief narrative:    65 y.o. female with a past medical history of TBI, seizures and hypothyroidism who presented to the ED with complaints of nausea, vomiting and dizziness for 3 days.She was found to have hyponatremia and UTI on admission. Her son is concerned about the pt overall gait instability.   Assessment/Plan:    Principal Problem: Nausea, vomiting / UTI - N/V likely due to acute infection, UTI - UA on admission with small leukocytes - Urine culture with multiple species none predominant - Completed 3 days of rocephin   Active Problems: Hyponatremia - Secondary to dehydration and has normalized with fluids  Hypothyroidism - Current TSH about 8 so will need to be repeated in 1 month - Continue Synthroid  Thrombocytopenia - Likely reactive   H/O TBI / Dementia / Post traumatic seizures - Continue Effexor, Depakote, Lamictal  - No reports of seizures in hospital  DVT Prophylaxis  - SCD's bilaterally    Code Status: Full.  Family Communication:  plan of care discussed with the patient's son and her husband over the phone  Disposition Plan: To SNF 2/15  IV access:  Peripheral IV  Procedures and diagnostic studies:    Dg Chest Port 1 View Jul 21, 2015  No active disease. Hyperinflation.   Medical Consultants:  Neurology   Other Consultants:  PT  IAnti-Infectives:   Rocephin 07/10/2015 --> 07/13/2015   Manson Passey, MD  [facility] Pager [phone]  Time spent in minutes: 15 minutes  If 7PM-7AM, please contact night-coverage [email or web address] Password [facility] 07/14/2015, 9:38 PM   LOS: 3 days    HPI/Subjective: No acute overnight events. No seizures.  Objective: Filed Vitals:   07/13/15 0608 07/13/15 1500 07/13/15 2042 07/14/15 0531  BP: 135/69 157/76  113/77 130/71  Pulse: 62 74 76 62  Temp: 97.6 F (36.4 C)  97.6 F (36.4 C) 97.8 F (36.6 C)  TempSrc: Oral  Oral Oral  Resp: Height:      Weight:      SpO2: 98% 100% 97% 96%    Intake/Output Summary (Last 24 hours) at 07/14/15 2138 Last data filed at 07/14/15 0600  Gross per 24 hour  Intake    890 ml  Output   1200 ml  Net   -310 ml    Exam:   General:  Pt is not in acute distress  Cardiovascular: RRR, S1/S2 (+)  Respiratory: no wheezing, no rhonchi   Data Reviewed: Basic Metabolic Panel:  Recent Labs Lab 07/08/15 1138 Jul 21, 2015 0118 07-21-2015 0516 07/12/15 0526  NA 133* 125* 127* 136  K 4.7 4.0 4.8 4.3  CL 98* 89* 91* 105  CO2 GLUCOSE 85 125* 119* 77  BUN CREATININE 0.66 0.56 0.56 0.56  CALCIUM 8.8* 8.7* 9.1 8.5*   Liver Function Tests:  Recent Labs Lab Jul 21, 2015 0118 07/12/15 0526  AST 19 14*  ALT 9* 8*  ALKPHOS 44 36*  BILITOT 0.6 0.5  PROT 6.3* 5.0*  ALBUMIN 3.5 2.7*    Recent Labs Lab 2015-07-21 0118  LIPASE 23   No results for input(s): AMMONIA in the last 168 hours. CBC:  Recent Labs Lab 07/08/15 1138 2015/07/21 0118 2015/07/21 0516 07/12/15 0526  WBC 5.8 6.7 5.2 4.3  NEUTROABS  --  5.8  --   --   HGB 12.2 13.8 13.0 11.9*  HCT 36.3 39.4 36.3 35.6*  MCV 93.1 90.6 88.8 93.4  PLT 166 166 165 149*   Cardiac Enzymes:  Recent Labs Lab 07/11/15 0516 07/11/15 1128 07/11/15 1854  TROPONINI <0.03 <0.03 <0.03   BNP: Invalid input(s): POCBNP CBG:  Recent Labs Lab 07/08/15 1140  GLUCAP 83    Recent Results (from the past 240 hour(s))  Urine culture     Status: None   Collection Time: 07/08/15 11:56 AM  Result Value Ref Range Status   Specimen Description URINE, CLEAN CATCH  Final   Special Requests NONE  Final   Culture   Final    MULTIPLE SPECIES PRESENT, SUGGEST RECOLLECTION Performed at [facility]    Report Status 07/09/2015 FINAL  Final  Urine culture     Status: None    Collection Time: 07/10/15 11:52 PM  Result Value Ref Range Status   Specimen Description URINE, CLEAN CATCH  Final   Special Requests NONE  Final   Culture   Final    MULTIPLE SPECIES PRESENT, SUGGEST RECOLLECTION Performed at [facility]    Report Status 07/12/2015 FINAL  Final     Scheduled Meds: . divalproex  500 mg Oral Q12H  . docusate sodium  100 mg Oral BID  . lamoTRIgine  100 mg Oral QHS  . levothyroxine  75 mcg Oral Daily  . sodium chloride flush  3 mL Intravenous Q12H  . venlafaxine XR  150 mg Oral Q breakfast   Continuous Infusions: . sodium chloride 50 mL/hr at 07/14/15 0426

## 2015-07-14 NOTE — Progress Notes (Signed)
To answer your question Dr. Vickey Huger, the patient's son [name]'s phone number is [phone].

## 2015-07-15 NOTE — Progress Notes (Signed)
Pt discharged to [facility] with home health care and pt family arranged private duty care for assistance for pt given limitations from pt insurance for SNF.   CSW discussed with pt son via telephone this morning who confirmed private duty care arranged. Pt son asked CSW to arrange transportation via [facility]. CSW spoke with [employer] who is able to provide transportation for pt back to [facility].  RN notified. RNCM arranged HH via [place].   No further social work needs identified at this time.  CSW signing off.   Loletta Specter, MSW, LCSW Clinical Social Work [phone]

## 2015-07-15 NOTE — Discharge Instructions (Signed)
1. Medications: antivert as needed for dizziness, zofran as needed for nausea, continue usual home medications 2. Treatment: rest, drink plenty of fluids 3. Follow Up: Please follow up with your primary doctor or the neurology clinic listed above within 7 days for discussion of your diagnoses and further evaluation after today's visit; Please return to the ER for new or worsening symptoms, any additional concerns.

## 2015-07-15 NOTE — Progress Notes (Signed)
Pt selected [facility] for [facility] Care, referral given.

## 2015-07-15 NOTE — Discharge Summary (Signed)
Physician Discharge Summary  Shannon Barry RUE:[id number] DOB: Oct 15, 1950 DOA: 07/10/2015  PCP: Elmarie Mainland, NP  Admit date: 07/10/2015 Discharge date: 07/15/2015  Recommendations for Outpatient Follow-up:  1. Patient is follow-up with PCP on outpatient basis in about 1-2 weeks after discharge. I spoke with her husband over the phone and they will also try to establish care in [place] for further evaluation of intermittent altered mental status changes.  Discharge Diagnoses:  Principal Problem:   Hyponatremia Active Problems:   Chest pain   Nausea and vomiting   Seizures (HCC)   Vertigo    Discharge Condition: stable   Diet recommendation: as tolerated   History of present illness:  65 y.o. female with a past medical history of TBI, seizures and hypothyroidism who presented to the ED with complaints of nausea, vomiting and dizziness for 3 days.She was found to have hyponatremia and UTI on admission. Her son is concerned about the pt overall gait instability.   Hospital Course:    Assessment/Plan:    Principal Problem: Nausea, vomiting / UTI - N/V likely due to acute infection, UTI - UA on admission with small leukocytes - Urine culture with multiple species none predominant - Completed 3 days of rocephin   Active Problems: Hyponatremia - Secondary to dehydration and has normalized with fluids  Hypothyroidism - Current TSH about 8 so will need to be repeated in 1 month - Continue Synthroid  Thrombocytopenia - Likely reactive   H/O TBI / Dementia / Post traumatic seizures - Continue Effexor, Depakote, Lamictal  - No reports of seizures in hospital  DVT Prophylaxis  - SCD's bilaterally    Code Status: Full.  Family Communication: plan of care discussed with the patient's son and her husband over the phone    IV access:  Peripheral IV  Procedures and diagnostic studies:   Dg Chest Port 1 View 07/11/2015 No active disease.  Hyperinflation.   Medical Consultants:  Neurology   Other Consultants:  PT IAnti-Infectives:   Rocephin 07/10/2015 --> 07/13/2015  Signed:  Manson Passey, MD  [facility] 07/15/2015, 9:36 AM  Pager #: [phone]  Time spent in minutes: more than 30 minutes   Discharge Exam: Filed Vitals:   07/14/15 2234 07/15/15 0509  BP: 138/59 138/65  Pulse: 64 97  Temp: 97.5 F (36.4 C) 98.2 F (36.8 C)  Resp: 16 16   Filed Vitals:   07/13/15 2042 07/14/15 0531 07/14/15 2234 07/15/15 0509  BP: 113/77 130/71 138/59 138/65  Pulse: 76 62 64 97  Temp: 97.6 F (36.4 C) 97.8 F (36.6 C) 97.5 F (36.4 C) 98.2 F (36.8 C)  TempSrc: Oral Oral Oral Oral  Resp: 16 16 16 16   Height:      Weight:      SpO2: 97% 96% 96% 96%    General: Pt is alert, follows commands appropriately, not in acute distress Cardiovascular: Regular rate and rhythm, S1/S2 +, no murmurs Respiratory: Clear to auscultation bilaterally, no wheezing, no crackles, no rhonchi Abdominal: Soft, non tender, non distended, bowel sounds +, no guarding Extremities: no edema, no cyanosis, pulses palpable bilaterally DP and PT Neuro: Grossly nonfocal  Discharge Instructions  Discharge Instructions    Call MD for:  difficulty breathing, headache or visual disturbances    Complete by:  As directed      Call MD for:  extreme fatigue    Complete by:  As directed      Call MD for:  persistant dizziness or light-headedness  Complete by:  As directed      Call MD for:  persistant nausea and vomiting    Complete by:  As directed      Call MD for:  severe uncontrolled pain    Complete by:  As directed      Diet - low sodium heart healthy    Complete by:  As directed      Increase activity slowly    Complete by:  As directed             Medication List    TAKE these medications        desvenlafaxine 100 MG 24 hr tablet  Commonly known as:  PRISTIQ  Take 100 mg by mouth daily.     divalproex 500 MG  DR tablet  Commonly known as:  DEPAKOTE  Take 1 tablet in the morning and 2 tablets at night.     ibuprofen 200 MG tablet  Commonly known as:  ADVIL,MOTRIN  Take 200 mg by mouth daily.     lamoTRIgine 100 MG tablet  Commonly known as:  LAMICTAL  TAKE 2 TABLET BY MOUTH AT BEDTIME     levothyroxine 75 MCG tablet  Commonly known as:  SYNTHROID, LEVOTHROID  Take 75 mcg by mouth daily.     meclizine 25 MG tablet  Commonly known as:  ANTIVERT  Take 1 tablet (25 mg total) by mouth 3 (three) times daily as needed for dizziness.     ondansetron 4 MG tablet  Commonly known as:  ZOFRAN  Take 1 tablet (4 mg total) by mouth every 8 (eight) hours as needed for nausea or vomiting.           Follow-up Information    Follow up with [facility].   Why:  As needed   Contact information:   [address]     [address] [place] [address] [phone]      Follow up with Elmarie Mainland, NP. Schedule an appointment as soon as possible for a visit in 2 weeks.   Specialty:  Family Medicine   Why:  Follow up appt after recent hospitalization   Contact information:   [address] [place] [address] [phone]        The results of significant diagnostics from this hospitalization (including imaging, microbiology, ancillary and laboratory) are listed below for reference.    Significant Diagnostic Studies: Dg Chest Port 1 View  07/11/2015  CLINICAL DATA:  Hyponatremia. EXAM: PORTABLE CHEST 1 VIEW COMPARISON:  None. FINDINGS: Normal heart size and mediastinal contours. Hyperinflation No acute infiltrate or edema. No effusion or pneumothorax. No acute osseous findings. IMPRESSION: No active disease. Hyperinflation. Electronically Signed   By: Marnee Spring M.D.   On: 07/11/2015 09:38    Microbiology: Recent Results (from the past 240 hour(s))  Urine culture     Status: None   Collection Time: 07/08/15 11:56 AM  Result Value Ref  Range Status   Specimen Description URINE, CLEAN CATCH  Final   Special Requests NONE  Final   Culture   Final    MULTIPLE SPECIES PRESENT, SUGGEST RECOLLECTION Performed at [facility]    Report Status 07/09/2015 FINAL  Final  Urine culture     Status: None   Collection Time: 07/10/15 11:52 PM  Result Value Ref Range Status   Specimen Description URINE, CLEAN CATCH  Final   Special Requests NONE  Final   Culture  Final    MULTIPLE SPECIES PRESENT, SUGGEST RECOLLECTION Performed at [facility]    Report Status 07/12/2015 FINAL  Final     Labs: Basic Metabolic Panel:  Recent Labs Lab 07/08/15 1138 07/11/15 0118 07/11/15 0516 07/12/15 0526  NA 133* 125* 127* 136  K 4.7 4.0 4.8 4.3  CL 98* 89* 91* 105  CO2 GLUCOSE 85 125* 119* 77  BUN CREATININE 0.66 0.56 0.56 0.56  CALCIUM 8.8* 8.7* 9.1 8.5*   Liver Function Tests:  Recent Labs Lab 07/11/15 0118 07/12/15 0526  AST 19 14*  ALT 9* 8*  ALKPHOS 44 36*  BILITOT 0.6 0.5  PROT 6.3* 5.0*  ALBUMIN 3.5 2.7*    Recent Labs Lab 07/11/15 0118  LIPASE 23   No results for input(s): AMMONIA in the last 168 hours. CBC:  Recent Labs Lab 07/08/15 1138 07/11/15 0118 07/11/15 0516 07/12/15 0526  WBC 5.8 6.7 5.2 4.3  NEUTROABS  --  5.8  --   --   HGB 12.2 13.8 13.0 11.9*  HCT 36.3 39.4 36.3 35.6*  MCV 93.1 90.6 88.8 93.4  PLT 166 166 165 149*   Cardiac Enzymes:  Recent Labs Lab 07/11/15 0516 07/11/15 1128 07/11/15 1854  TROPONINI <0.03 <0.03 <0.03   BNP: BNP (last 3 results) No results for input(s): BNP in the last 8760 hours.  ProBNP (last 3 results) No results for input(s): PROBNP in the last 8760 hours.  CBG:  Recent Labs Lab 07/08/15 1140  GLUCAP 83

## 2015-07-15 NOTE — Clinical Social Work Placement (Signed)
   CLINICAL SOCIAL WORK PLACEMENT  NOTE  Date:  07/15/2015  Patient Details  Name: Shannon Barry MRN: [id number] Date of Birth: 09-10-1950  Clinical Social Work is seeking post-discharge placement for this patient at the Skilled  Nursing Facility level of care (*CSW will initial, date and re-position this form in  chart as items are completed):  Yes   Patient/family provided with [facility] Clinical Social Work Department's list of facilities offering this level of care within the geographic area requested by the patient (or if unable, by the patient's family).  Yes   Patient/family informed of their freedom to choose among providers that offer the needed level of care, that participate in Medicare, Medicaid or managed care program needed by the patient, have an available bed and are willing to accept the patient.  Yes   Patient/family informed of [facility]'s ownership interest in [facility] and [facility], as well as of the fact that they are under no obligation to receive care at these facilities.  PASRR submitted to EDS on 07/13/15     PASRR number received on 07/13/15     Existing PASRR number confirmed on       FL2 transmitted to all facilities in geographic area requested by pt/family on 07/13/15     FL2 transmitted to all facilities within larger geographic area on       Patient informed that his/her managed care company has contracts with or will negotiate with certain facilities, including the following:        Yes   Patient/family informed of bed offers received.  Patient chooses bed at Other - please specify in the comment section below: (Pt family arranging private duty care at [facility])     Physician recommends and patient chooses bed at      Patient to be transferred to Other - please specify in the comment section below: ([facility]) on 07/15/15.  Patient to be transferred to facility by [facility] transportation     Patient family  notified on 07/15/15 of transfer.  Name of family member notified:  pt son, [name] notified via telephone     PHYSICIAN Please sign FL2 (pt has [employer] which may require authorization)     Additional Comment:    _______________________________________________ Orson Eva, LCSW 07/15/2015, 1:35 PM

## 2015-07-23 ENCOUNTER — Encounter (HOSPITAL_COMMUNITY): Payer: Self-pay

## 2015-07-23 ENCOUNTER — Emergency Department (HOSPITAL_COMMUNITY)
Admission: EM | Admit: 2015-07-23 | Discharge: 2015-07-24 | Disposition: A | Discharge status: Home / Self Care | Payer: PRIVATE HEALTH INSURANCE | Attending: Emergency Medicine | Admitting: Emergency Medicine

## 2015-07-23 ENCOUNTER — Emergency Department (HOSPITAL_COMMUNITY): Payer: PRIVATE HEALTH INSURANCE

## 2015-07-23 DIAGNOSIS — R42 Dizziness and giddiness: Secondary | ICD-10-CM | POA: Insufficient documentation

## 2015-07-23 DIAGNOSIS — Z791 Long term (current) use of non-steroidal anti-inflammatories (NSAID): Secondary | ICD-10-CM | POA: Diagnosis not present

## 2015-07-23 DIAGNOSIS — M6250 Muscle wasting and atrophy, not elsewhere classified, unspecified site: Secondary | ICD-10-CM | POA: Diagnosis not present

## 2015-07-23 DIAGNOSIS — Z79899 Other long term (current) drug therapy: Secondary | ICD-10-CM | POA: Insufficient documentation

## 2015-07-23 DIAGNOSIS — E079 Disorder of thyroid, unspecified: Secondary | ICD-10-CM | POA: Insufficient documentation

## 2015-07-23 DIAGNOSIS — Z87820 Personal history of traumatic brain injury: Secondary | ICD-10-CM | POA: Insufficient documentation

## 2015-07-23 DIAGNOSIS — R4182 Altered mental status, unspecified: Secondary | ICD-10-CM | POA: Diagnosis present

## 2015-07-23 DIAGNOSIS — F329 Major depressive disorder, single episode, unspecified: Secondary | ICD-10-CM | POA: Insufficient documentation

## 2015-07-23 DIAGNOSIS — R5381 Other malaise: Secondary | ICD-10-CM

## 2015-07-23 DIAGNOSIS — R112 Nausea with vomiting, unspecified: Secondary | ICD-10-CM | POA: Diagnosis not present

## 2015-07-23 DIAGNOSIS — Z880 Allergy status to penicillin: Secondary | ICD-10-CM | POA: Diagnosis not present

## 2015-07-23 MED ORDER — ONDANSETRON HCL 4 MG/2ML IJ SOLN
4.0000 mg | Freq: Once | INTRAMUSCULAR | Status: AC
  Administered 2015-07-24: 4 mg via INTRAVENOUS
  Filled 2015-07-23: qty 2

## 2015-07-23 MED ORDER — SODIUM CHLORIDE 0.9 % IV SOLN
INTRAVENOUS | Status: DC
  Administered 2015-07-24: 01:00:00 via INTRAVENOUS
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
## 2015-07-23 NOTE — ED Notes (Signed)
Per EMS patient reports nausea, dizziness, weakness.  Reports this is intermittent.

## 2015-07-23 NOTE — ED Notes (Signed)
Bed: ZO10 Expected date:  Expected time:  Means of arrival:  Comments: EMS 65 yo female from SNF, nausea, vomiting,dizzy,generalized weakness,Zofran

## 2015-07-23 NOTE — ED Provider Notes (Signed)
CSN: [id number]     Arrival date & time 07/23/15  2219 History  By signing my name below, I, Shannon Barry, attest that this documentation has been prepared under the direction and in the presence of Shannon Razor, MD. Electronically Signed: Budd Barry, ED Scribe. 07/23/2015. 11:36 PM.     Chief Complaint  Patient presents with  . Altered Mental Status  . Emesis   The history is provided by the patient and a relative. No language interpreter was used.   HPI Comments: Shannon Barry is a 65 y.o. female with a PMHX of seizures, traumatic brain injury, thyroid disease and depression brought in by ambulance, who presents to the Emergency Department complaining of AMS onset this evening, and one episode of emesis 2.5 hours ago. Per relative, pt "seems unable to wake up." He notes pt has been very slow. He states pt was acting normally and walking around 2 days ago and went out with family for pizza. Pt states she came in because she is concerned she may have another seizure, as her most recent one occurred immediately after she had an episode of emesis. She reports associated fatigue, dizziness, and nausea. She states she is on Lamictal and Depakote for her seizures. Per relative, pt's last seizure occurred 3 weeks ago. Previously to that, she had one seizure 3 months before that, and one in the summer of 2016. He states it seems as though the seizures are getting more frequent. He reports pt has a neurologist, but has not been seen there for the past few months. Pt denies any recent falls. She also denies fever.  Pt is allergic to penicillins and sulfa antibiotics.  Past Medical History  Diagnosis Date  . Depression   . Seizures (HCC)   . Allergy   . Thyroid disease   . Traumatic brain injury [facility])    History reviewed. No pertinent past surgical history. No family history on file. Social History  Substance Use Topics  . Smoking status: Never Smoker   . Smokeless tobacco: None  . Alcohol  Use: No   OB History    No data available     Review of Systems  Constitutional: Positive for fatigue. Negative for fever.  Gastrointestinal: Positive for nausea and vomiting.  Neurological: Positive for dizziness.  All other systems reviewed and are negative.   Allergies  Penicillins and Sulfa antibiotics  Home Medications   Prior to Admission medications   Medication Sig Start Date End Date Taking? Authorizing Provider  desvenlafaxine (PRISTIQ) 100 MG 24 hr tablet Take 100 mg by mouth daily.    Yes Historical Provider, MD  divalproex (DEPAKOTE) 500 MG DR tablet Take 1 tablet in the morning and 2 tablets at night. 05/05/15  Yes Carmen Dohmeier, MD  ibuprofen (ADVIL,MOTRIN) 200 MG tablet Take 200 mg by mouth daily.   Yes Historical Provider, MD  lamoTRIgine (LAMICTAL) 100 MG tablet TAKE 2 TABLET BY MOUTH AT BEDTIME Patient taking differently: TAKE 1 TABLET BY MOUTH AT BEDTIME 06/03/15  Yes Carmen Dohmeier, MD  levothyroxine (SYNTHROID, LEVOTHROID) 75 MCG tablet Take 75 mcg by mouth daily.   Yes Historical Provider, MD  meclizine (ANTIVERT) 25 MG tablet Take 1 tablet (25 mg total) by mouth 3 (three) times daily as needed for dizziness. 07/11/15  Yes Jaime Pilcher Ward, PA-C  ondansetron (ZOFRAN) 4 MG tablet Take 1 tablet (4 mg total) by mouth every 8 (eight) hours as needed for nausea or vomiting. 07/11/15  Yes Chase Picket Ward, PA-C  BP 142/68 mmHg  Pulse 70  Temp(Src) 96 F (35.6 C) (Oral)  Resp 14  SpO2 96% Physical Exam  Constitutional: She is oriented to person, place, and time. She appears well-developed and well-nourished.  Drowsy, speech is very slow, speaking softly and difficult to understand, answers questions appropriately, follows simple commands, no focal motor deficit  HENT:  Head: Normocephalic and atraumatic.  Eyes: Conjunctivae are normal. Right eye exhibits no discharge. Left eye exhibits no discharge.  Pulmonary/Chest: Effort normal. No respiratory distress.   Musculoskeletal: Normal range of motion.  Neurological: She is alert and oriented to person, place, and time. Coordination normal.  Skin: Skin is warm and dry. No rash noted. She is not diaphoretic. No erythema.  Nursing note and vitals reviewed.   ED Course  Procedures  DIAGNOSTIC STUDIES: Oxygen Saturation is 94% on RA, low by my interpretation.    COORDINATION OF CARE: 11:33 PM - Discussed normal Depakote levels and plans to order diagnostic studies. Discussed possibility of admitting pt and ordering an MRI. Pt advised of plan for treatment and pt agrees.  Labs Review Labs Reviewed  CBC WITH DIFFERENTIAL/PLATELET - Abnormal; Notable for the following:    Platelets 144 (*)    All other components within normal limits  PHENYTOIN LEVEL, TOTAL - Abnormal; Notable for the following:    Phenytoin Lvl <2.5 (*)    All other components within normal limits  BASIC METABOLIC PANEL - Abnormal; Notable for the following:    Sodium 133 (*)    Chloride 97 (*)    Glucose, Bld 124 (*)    All other components within normal limits  URINALYSIS, ROUTINE W REFLEX MICROSCOPIC (NOT AT [facility]) - Abnormal; Notable for the following:    Ketones, ur 40 (*)    All other components within normal limits  VALPROIC ACID LEVEL    Imaging Review Ct Head Wo Contrast  07/24/2015  CLINICAL DATA:  Persistent vertigo.  Vomiting. EXAM: CT HEAD WITHOUT CONTRAST TECHNIQUE: Contiguous axial images were obtained from the base of the skull through the vertex without intravenous contrast. COMPARISON:  Brain MRI 04/15/2015 FINDINGS: Generalized cerebral and cerebellar atrophy, advanced for age. Mild chronic small vessel ischemia. Encephalomalacia in the anterior right temporal and posterior right parietal lobe lobe. No intracranial hemorrhage, mass effect, or midline shift. No hydrocephalus. The basilar cisterns are patent. No evidence of territorial infarct. No intracranial fluid collection. Right parietal burr holes noted.  Calvarium is intact. Included paranasal sinuses and mastoid air cells are well aerated. IMPRESSION: 1. No acute intracranial process. 2. Advanced for age atrophy. Mild chronic small vessel ischemia. Right temporal and parietal encephalomalacia. Electronically Signed   By: Rubye Oaks M.D.   On: 07/24/2015 00:17   I have personally reviewed and evaluated these images and lab results as part of my medical decision-making.   EKG Interpretation None      MDM   Final diagnoses:  Physical deconditioning    64yF with generalized weakness/deconditioning.  Hx of vertigo and seizure but neither seem to be the acute issue. Has progressively declined over the last several weeks in terms of her functional status. I saw her earlier this month in the ED for similar issue. Seen in ED a couple days later and admitted 2/10-15. Back again for same today.  Currently at [facility] but has some a home health services.   I personally preformed the services scribed in my presence. The recorded information has been reviewed is accurate. Shannon Razor, MD.  Shannon Razor, MD 07/24/15 [phone]

## 2015-07-24 LAB — BASIC METABOLIC PANEL
Anion gap: 10 (ref 5–15)
BUN: 12 mg/dL (ref 6–20)
CO2: 26 mmol/L (ref 22–32)
Calcium: 9.3 mg/dL (ref 8.9–10.3)
Chloride: 97 mmol/L — ABNORMAL LOW (ref 101–111)
Creatinine, Ser: 0.55 mg/dL (ref 0.44–1.00)
GFR calc Af Amer: >60 mL/min (ref >60)
GFR calc non Af Amer: >60 mL/min (ref >60)
Glucose, Bld: 124 mg/dL — ABNORMAL HIGH (ref 65–99)
Potassium: 4.5 mmol/L (ref 3.5–5.1)
Sodium: 133 mmol/L — ABNORMAL LOW (ref 135–145)

## 2015-07-24 LAB — URINALYSIS, ROUTINE W REFLEX MICROSCOPIC
Bilirubin Urine: NEGATIVE
Glucose, UA: NEGATIVE mg/dL
Hgb urine dipstick: NEGATIVE
Ketones, ur: 40 mg/dL — AB
Leukocytes, UA: NEGATIVE
Nitrite: NEGATIVE
Protein, ur: NEGATIVE mg/dL
Specific Gravity, Urine: 1.021 (ref 1.005–1.030)
pH: 6.5 (ref 5.0–8.0)

## 2015-07-24 LAB — CBC WITH DIFFERENTIAL/PLATELET
Basophils Absolute: 0 10*3/uL (ref 0.0–0.1)
Basophils Relative: 0 %
Eosinophils Absolute: 0 10*3/uL (ref 0.0–0.7)
Eosinophils Relative: 0 %
HCT: 38.5 % (ref 36.0–46.0)
Hemoglobin: 13 g/dL (ref 12.0–15.0)
Lymphocytes Relative: 26 %
Lymphs Abs: 1.7 10*3/uL (ref 0.7–4.0)
MCH: 31.3 pg (ref 26.0–34.0)
MCHC: 33.8 g/dL (ref 30.0–36.0)
MCV: 92.5 fL (ref 78.0–100.0)
Monocytes Absolute: 0.8 10*3/uL (ref 0.1–1.0)
Monocytes Relative: 11 %
Neutro Abs: 4.1 10*3/uL (ref 1.7–7.7)
Neutrophils Relative %: 63 %
Platelets: 144 10*3/uL — ABNORMAL LOW (ref 150–400)
RBC: 4.16 MIL/uL (ref 3.87–5.11)
RDW: 13.5 % (ref 11.5–15.5)
WBC: 6.6 10*3/uL (ref 4.0–10.5)

## 2015-07-24 LAB — PHENYTOIN LEVEL, TOTAL: Phenytoin Lvl: <2.5 ug/mL — ABNORMAL LOW (ref 10.0–20.0)

## 2015-07-24 LAB — VALPROIC ACID LEVEL: Valproic Acid Lvl: 91 ug/mL (ref 50.0–100.0)

## 2015-07-24 MED ORDER — LEVOTHYROXINE SODIUM 75 MCG PO TABS
75.0000 ug | ORAL_TABLET | Freq: Every day | ORAL | Status: DC
  Filled 2015-07-24: qty 1

## 2015-07-24 MED ORDER — VENLAFAXINE HCL ER 150 MG PO CP24
150.0000 mg | ORAL_CAPSULE | ORAL | Status: AC
  Administered 2015-07-24: 150 mg via ORAL
  Filled 2015-07-24: qty 1

## 2015-07-24 NOTE — ED Notes (Signed)
Bed: WHALB Expected date:  Expected time:  Means of arrival:  Comments: 

## 2015-07-24 NOTE — ED Notes (Signed)
Pts let hand IV has pulled out

## 2015-07-24 NOTE — Discharge Instructions (Signed)
Deconditioning  Deconditioning refers to the changes in your body that occur during a period of inactivity. Deconditioning results in changes to your heart, lungs, and muscles. These changes decrease your ability to endure activity, resulting in feelings of fatigue and weakness.  Deconditioning can occur after only a few days of bed rest or inactivity. The longer the period of inactivity, the more severe your symptoms of deconditioning will be. After longer periods of inactivity, it will also take longer for you to return to your previous level of functioning.  Deconditioning can be mild, moderate, or severe:  · Mild. Your condition interferes with your ability to perform your usual types of exercise, such as running, biking, or swimming.  · Moderate. Your condition interferes with your ability to do normal everyday activities. This may include walking, grocery shopping, or doing chores or lawn work.  · Severe. Your condition interferes with your ability to perform minimal activity or normal self-care.  CAUSES   Some common reasons for inactivity that may result in deconditioning include:  · Illnesses, such as cancer, stroke, heart attack, fibromyalgia, or chronic fatigue syndrome.  · Injuries, especially back injuries, broken bones, or injury to ligaments or tendons.  · Surgery or a long stay in the hospital for any reason.  · Pregnancy, especially with conditions that require long periods of bed rest.  RISK FACTORS  Anything that results in a period of hospitalization or bed rest will put you at risk of deconditioning. Some other factors that can increase the risk include:  · Obesity.  · Poor nutrition.  · Old age.  · Injuries or illnesses that interfere with movement and activity.  SIGNS AND SYMPTOMS  · Feeling weak.  · Feeling tired.  · Shortness of breath with minor exertion.  · Your heart beating faster than normal. You may or may not notice this without taking your pulse.  · Pain or discomfort with  activity.  · Decreased strength.  · Decreased sense of balance.  · Decreased endurance.  · Difficulty participating in usual forms of exercise.  · Difficulty doing activities of daily living, such as grocery shopping or chores.  · Difficulty walking around the house and doing basic self-care, such as getting to the bathroom, preparing meals, or doing laundry.  DIAGNOSIS   There is no specific test to diagnose deconditioning. Your health care provider will take your medical history and do a physical exam. During the physical exam, the health care provider will check for signs of deconditioning, such as:  · Decreased size of muscles.  · Decreased strength.  · Difficulty with balance.  · Shortness of breath or abnormally increased heart rate after minor exertion.  TREATMENT   Treatment usually involves a structured exercise program in which activity is increased gradually. Your health care provider will determine which exercises are right for you. The exercise program will likely include aerobic exercise and strength training. Aerobic exercise helps improve the functioning of the heart and lungs as well as the muscles. Strength training helps improve muscle size and strength. Both of these types of exercise will improve your endurance. You may be referred to a physical therapist who can create a safe strengthening program for you to follow.  HOME CARE INSTRUCTIONS  · Follow the exercise program recommended by your health care provider or physical therapist.  ? Do not increase your exercise any faster than directed.  · Eat a healthy diet.  · If your health care provider thinks that you   need to lose weight, consider seeing a dietitian to help you do so in a healthy way.  · Do not use any tobacco products, including cigarettes, chewing tobacco, or electronic cigarettes. If you need help quitting, ask your health care provider.  · Take medicines only as directed by your health care provider.  · Keep all follow-up visits as  directed by your health care provider. This is important.  SEEK MEDICAL CARE IF:  · You are not able to carry out the prescribed exercise program.  · You are not able to carry out your usual level of activity.  · You are having trouble doing normal household chores or caring for yourself.  · You are becoming increasingly fatigued and weak.  · You become light-headed when rising to a sitting or standing position.  · Your level of endurance decreases after having improved.  SEEK IMMEDIATE MEDICAL CARE IF:  · You have chest pain.  · You are very short of breath.  · You have any episodes of passing out.     This information is not intended to replace advice given to you by your health care provider. Make sure you discuss any questions you have with your health care provider.     Document Released: 09/30/2013 Document Reviewed: 09/30/2013  Elsevier Interactive Patient Education ©2016 Elsevier Inc.   

## 2015-07-24 NOTE — Progress Notes (Signed)
ED Cm updated Shannon Barry at she wants clinicals & d/c instructions faxed to her at [phone]  She confirms [facility] was working with pt but will have [place] [facility] work with pt to assess need of level of care change

## 2015-07-24 NOTE — Progress Notes (Signed)
CM informed EDP about pt wanting her morning meds

## 2015-07-24 NOTE — ED Notes (Signed)
SOCIAL PRESENT SPEAKING WITH PT AND FAMILY

## 2015-07-24 NOTE — Progress Notes (Signed)
Tim of [facility] confirms pt is receiving [facility] Genevieve Norlander is not active at this time but if needed would be willing to assist

## 2015-07-24 NOTE — Progress Notes (Addendum)
Entered in d/c instructions  [place], Home Health Call As needed Shannon Barry has been contacted to assist with home health nurse, physical therapy and social worker [address] [place] [address] [place] [address] [phone]

## 2015-07-24 NOTE — ED Notes (Signed)
Commode at bedside

## 2015-07-24 NOTE — Progress Notes (Signed)
Updated pt who agrees to returning to [facility] with [place] [facility] to evaluate her  Updated EDRN, ED SW   Cancelled RW for facility Pt states she has 2 at [facility]

## 2015-07-24 NOTE — Progress Notes (Signed)
CM entered in PT evaluation order after speaking with EDP, [facility] and left a message at [phone] for therapy to see if pt can be seen  Consulted with SW staff to confirm [facility] has only independent and assisted living levels of care no snf level of care  Cm spoke with Almyra Deforest at [facility] [phone] Debbie states that [facility] provides pt with medication management twice a day, showers twice a week in the morning and at night, escort her to the dining room for lunch and dinner and provide safety checks on the pt Debbie states Pt is "borderline assisted living facility" pt reports in the last 2 weeks the pt has "been transitioning up" but the staff at the facility has not worked on getting the pt to assisted living level at [facility] will consult with Baruch Goldmann nurse CM to see if pt may be able to go from independent to assisted living level of care  1402 spoke with Caryn Bee about assisting pt with HHRN/PT/SW Orders entered 1404 Updated EDP
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
## 2015-07-24 NOTE — Progress Notes (Addendum)
ED CM consulted by EDP about pt possible increase in level of care a facility ED CM reviewed labs, clinicals for pt  ED CM spoke with ED SW about this  Pt with 2 ED visits and 1 admission in last 10 days [facility] seeing pt at [facility] independent  Cm inquired about services receiving at [facility]

## 2015-07-24 NOTE — ED Notes (Signed)
Pt oob standing at end of bed; pt hollering for help; pt climbed out of end of stretcher; no fall and no injury to patient; pt assisted back to bed and bedpan provided

## 2015-07-24 NOTE — ED Notes (Addendum)
Son present at [employer] inquiring mother's status. At that present time his mother was not present in the stretcher. Unaware of pt's transfer with PTAR back to [facility]. Son made aware of miscommunications at discharge and given a copy of the AVS. Son verbalized understanding. Charge Francesco Runner RN aware of events.

## 2015-07-24 NOTE — ED Provider Notes (Signed)
Patient has 2 rolling walkers at home.  Case management has been involved.  Home RN, PT, social work will be obtained.  Overall well-appearing.  Vital signs are normal.  Patient without focal complaint at this time  Azalia Bilis, MD 07/24/15 1423

## 2015-08-05 ENCOUNTER — Emergency Department (HOSPITAL_COMMUNITY)
Admission: EM | Admit: 2015-08-05 | Discharge: 2015-08-06 | Disposition: A | Discharge status: Home / Self Care | Payer: PRIVATE HEALTH INSURANCE | Attending: Emergency Medicine | Admitting: Emergency Medicine

## 2015-08-05 ENCOUNTER — Emergency Department (HOSPITAL_COMMUNITY): Payer: PRIVATE HEALTH INSURANCE

## 2015-08-05 ENCOUNTER — Encounter (HOSPITAL_COMMUNITY): Payer: Self-pay

## 2015-08-05 DIAGNOSIS — S0990XA Unspecified injury of head, initial encounter: Secondary | ICD-10-CM

## 2015-08-05 DIAGNOSIS — Z79899 Other long term (current) drug therapy: Secondary | ICD-10-CM | POA: Diagnosis not present

## 2015-08-05 DIAGNOSIS — Z880 Allergy status to penicillin: Secondary | ICD-10-CM | POA: Insufficient documentation

## 2015-08-05 DIAGNOSIS — N390 Urinary tract infection, site not specified: Secondary | ICD-10-CM | POA: Diagnosis not present

## 2015-08-05 DIAGNOSIS — E079 Disorder of thyroid, unspecified: Secondary | ICD-10-CM | POA: Insufficient documentation

## 2015-08-05 DIAGNOSIS — Z791 Long term (current) use of non-steroidal anti-inflammatories (NSAID): Secondary | ICD-10-CM | POA: Insufficient documentation

## 2015-08-05 DIAGNOSIS — W01198A Fall on same level from slipping, tripping and stumbling with subsequent striking against other object, initial encounter: Secondary | ICD-10-CM | POA: Diagnosis not present

## 2015-08-05 DIAGNOSIS — Y998 Other external cause status: Secondary | ICD-10-CM | POA: Diagnosis not present

## 2015-08-05 DIAGNOSIS — Y9289 Other specified places as the place of occurrence of the external cause: Secondary | ICD-10-CM | POA: Insufficient documentation

## 2015-08-05 DIAGNOSIS — R42 Dizziness and giddiness: Secondary | ICD-10-CM

## 2015-08-05 DIAGNOSIS — Y9389 Activity, other specified: Secondary | ICD-10-CM | POA: Insufficient documentation

## 2015-08-05 DIAGNOSIS — F329 Major depressive disorder, single episode, unspecified: Secondary | ICD-10-CM | POA: Diagnosis not present

## 2015-08-05 DIAGNOSIS — Z87820 Personal history of traumatic brain injury: Secondary | ICD-10-CM | POA: Insufficient documentation
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
## 2015-08-05 NOTE — ED Notes (Signed)
PT RECEIVED VIA EMS FOR A FALL. PT STATES SHE FELL BACKWARDS AFTER BECOMING UNBALANCED. PT STATES SHE WAS NOT DIZZY, BUT WHEN SHE STARTS MOVING, SHE IS UNABLE TO STOP HERSELF. PT DENIES HEAD, NECK, BACK PAIN, OR LOC. PER THE NURSING FACILITY, THE LAST TIME THE PT HAD THIS EPISODE, SHE HAD A UTI. PT DENIES ANY URINARY SYMPTOMS.

## 2015-08-06 LAB — URINE MICROSCOPIC-ADD ON

## 2015-08-06 LAB — URINALYSIS, ROUTINE W REFLEX MICROSCOPIC
Bilirubin Urine: NEGATIVE
Glucose, UA: NEGATIVE mg/dL
KETONES UR: 15 mg/dL — AB
NITRITE: NEGATIVE
PH: 6 (ref 5.0–8.0)
Protein, ur: NEGATIVE mg/dL
Specific Gravity, Urine: 1.026 (ref 1.005–1.030)

## 2015-08-06 MED ORDER — CIPROFLOXACIN HCL 500 MG PO TABS: 500.0000 mg | ORAL_TABLET | Freq: Two times a day (BID) | ORAL | Status: DC

## 2015-08-06 MED ORDER — CIPROFLOXACIN HCL 500 MG PO TABS
500.0000 mg | ORAL_TABLET | Freq: Once | ORAL | Status: AC
  Administered 2015-08-06: 500 mg via ORAL
  Filled 2015-08-06: qty 1

## 2015-08-06 NOTE — ED Provider Notes (Signed)
Patient care signed out at end of shift from [facility], [place], pending UA to r/o UTI as source of fall.   UA results show TNTC WBCs, few bacteria. Urine culture pending. Will start on Cipro. Patient made aware of results and plan. She is discharged home per plan of previous care team.   Elpidio AnisShari Bryton Waight, PA-C 08/06/15 [phone]  Tomasita CrumbleAdeleke Oni, MD 08/07/15 [phone]

## 2015-08-06 NOTE — Discharge Instructions (Signed)
Please read and follow all provided instructions.  Your diagnoses today include:  1. Dizziness   2. Minor head injury, initial encounter     Tests performed today include:  CT scan of your head that did not show any serious injury.  Vital signs. See below for your results today.   Medications prescribed:   None  Take any prescribed medications only as directed.  Home care instructions:  Follow any educational materials contained in this packet.  BE VERY CAREFUL not to take multiple medicines containing Tylenol (also called acetaminophen). Doing so can lead to an overdose which can damage your liver and cause liver failure and possibly death.   Follow-up instructions: Please follow-up with your primary care provider in the next 3 days for further evaluation of your symptoms.   Return instructions:  SEEK IMMEDIATE MEDICAL ATTENTION IF:  There is confusion or drowsiness (although children frequently become drowsy after injury).   You cannot awaken the injured person.   You have more than one episode of vomiting.   You notice dizziness or unsteadiness which is getting worse, or inability to walk.   You have convulsions or unconsciousness.   You experience severe, persistent headaches not relieved by Tylenol.  You cannot use arms or legs normally.   There are changes in pupil sizes. (This is the black center in the colored part of the eye)   There is clear or bloody discharge from the nose or ears.   You have change in speech, vision, swallowing, or understanding.   Localized weakness, numbness, tingling, or change in bowel or bladder control.  You have any other emergent concerns.  Additional Information: You have had a head injury which does not appear to require admission at this time.  Your vital signs today were: BP 134/85 mmHg   Pulse 75   Temp(Src) 98.4 F (36.9 C) (Oral)   Resp 17   Ht  (1.575 m)   Wt 62.596 kg   BMI 25.23 kg/m2   SpO2 96% If your  blood pressure (BP) was elevated above 135/85 this visit, please have this repeated by your doctor within one month. --------------  Urinary Tract Infection Urinary tract infections (UTIs) can develop anywhere along your urinary tract. Your urinary tract is your body's drainage system for removing wastes and extra water. Your urinary tract includes two kidneys, two ureters, a bladder, and a urethra. Your kidneys are a pair of bean-shaped organs. Each kidney is about the size of your fist. They are located below your ribs, one on each side of your spine. CAUSES Infections are caused by microbes, which are microscopic organisms, including fungi, viruses, and bacteria. These organisms are so small that they can only be seen through a microscope. Bacteria are the microbes that most commonly cause UTIs. SYMPTOMS  Symptoms of UTIs may vary by age and gender of the patient and by the location of the infection. Symptoms in young women typically include a frequent and intense urge to urinate and a painful, burning feeling in the bladder or urethra during urination. Older women and men are more likely to be tired, shaky, and weak and have muscle aches and abdominal pain. A fever may mean the infection is in your kidneys. Other symptoms of a kidney infection include pain in your back or sides below the ribs, nausea, and vomiting. DIAGNOSIS To diagnose a UTI, your caregiver will ask you about your symptoms. Your caregiver will also ask you to provide a urine sample. The  urine sample will be tested for bacteria and white blood cells. White blood cells are made by your body to help fight infection. TREATMENT  Typically, UTIs can be treated with medication. Because most UTIs are caused by a bacterial infection, they usually can be treated with the use of antibiotics. The choice of antibiotic and length of treatment depend on your symptoms and the type of bacteria causing your infection. HOME CARE INSTRUCTIONS  If  you were prescribed antibiotics, take them exactly as your caregiver instructs you. Finish the medication even if you feel better after you have only taken some of the medication.  Drink enough water and fluids to keep your urine clear or pale yellow.  Avoid caffeine, tea, and carbonated beverages. They tend to irritate your bladder.  Empty your bladder often. Avoid holding urine for long periods of time.  Empty your bladder before and after sexual intercourse.  After a bowel movement, women should cleanse from front to back. Use each tissue only once. SEEK MEDICAL CARE IF:   You have back pain.  You develop a fever.  Your symptoms do not begin to resolve within 3 days. SEEK IMMEDIATE MEDICAL CARE IF:   You have severe back pain or lower abdominal pain.  You develop chills.  You have nausea or vomiting.  You have continued burning or discomfort with urination. MAKE SURE YOU:   Understand these instructions.  Will watch your condition.  Will get help right away if you are not doing well or get worse.   This information is not intended to replace advice given to you by your health care provider. Make sure you discuss any questions you have with your health care provider.   Document Released: 02/23/2005 Document Revised: 02/04/2015 Document Reviewed: 06/24/2011 Elsevier Interactive Patient Education [employer]2016 Elsevier Inc.

## 2015-08-06 NOTE — ED Notes (Signed)
PT DISCHARGED. INSTRUCTIONS WILL BE GIVEN TO PTAR STAFF. AAOX3. PT IN NO APPARENT DISTRESS OR PAIN. THE OPPORTUNITY TO ASK QUESTIONS WAS PROVIDED. AWAITING TRANSPORTATION.

## 2015-08-06 NOTE — ED Provider Notes (Signed)
CSN: [id number]     Arrival date & time 08/05/15  2140 History   First MD Initiated Contact with Patient 08/05/15 2345     Chief Complaint  Patient presents with  . Fall    PT BECAME UNBALANCED AND FELL BACKWARDS     (Consider location/radiation/quality/duration/timing/severity/associated sxs/prior Treatment) HPI Comments: Patient with history of dizziness, multiple ED visits for falls, history of UTI, seen recently by home health -- presents after a fall. Patient is supposed to be using a walker due to disequilibrium however was not tonight when she lost her balance and fell backwards striking the back of her head. No loss of consciousness. No current headache, vision change, vomiting. No weakness in her arms or her legs. Otherwise denies symptoms of infection. The onset of this condition was acute. The course is constant. Aggravating factors: none. Alleviating factors: none.    Patient is a 65 y.o. female presenting with fall. The history is provided by the patient and medical records.  Fall Pertinent negatives include no abdominal pain, chest pain, coughing, fatigue, fever, headaches, myalgias, nausea, neck pain, numbness, rash, sore throat, vomiting or weakness.    Past Medical History  Diagnosis Date  . Depression   . Seizures (HCC)   . Allergy   . Thyroid disease   . 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 No pertinent past surgical history. History reviewed. No pertinent family history. Social History  Substance Use Topics  . Smoking status: Never Smoker   . Smokeless tobacco: None  . Alcohol Use: No   OB History    No data available     Review of Systems  Constitutional: Negative for fever and fatigue.  HENT: Negative for rhinorrhea, sore throat and tinnitus.   Eyes: Negative for photophobia, pain, redness and visual disturbance.  Respiratory: Negative for cough and shortness of breath.   Cardiovascular: Negative for chest pain.  Gastrointestinal:  Negative for nausea, vomiting, abdominal pain and diarrhea.  Genitourinary: Negative for dysuria.  Musculoskeletal: Negative for myalgias, back pain, gait problem and neck pain.  Skin: Negative for rash and wound.  Neurological: Positive for dizziness. Negative for weakness, light-headedness, numbness and headaches.  Psychiatric/Behavioral: Negative for confusion and decreased concentration.      Allergies  Penicillins and Sulfa antibiotics  Home Medications   Prior to Admission medications   Medication Sig Start Date End Date Taking? Authorizing Provider  desvenlafaxine (PRISTIQ) 100 MG 24 hr tablet Take 100 mg by mouth daily.     Historical Provider, MD  divalproex (DEPAKOTE) 500 MG DR tablet Take 1 tablet in the morning and 2 tablets at night. 05/05/15   Porfirio Mylararmen Dohmeier, MD  ibuprofen (ADVIL,MOTRIN) 200 MG tablet Take 200 mg by mouth daily.    Historical Provider, MD  lamoTRIgine (LAMICTAL) 100 MG tablet TAKE 2 TABLET BY MOUTH AT BEDTIME Patient taking differently: TAKE 1 TABLET BY MOUTH AT BEDTIME 06/03/15   Porfirio Mylararmen Dohmeier, MD  levothyroxine (SYNTHROID, LEVOTHROID) 75 MCG tablet Take 75 mcg by mouth daily.    Historical Provider, MD  meclizine (ANTIVERT) 25 MG tablet Take 1 tablet (25 mg total) by mouth 3 (three) times daily as needed for dizziness. 07/11/15   Chase PicketJaime Pilcher Ward, PA-C  ondansetron (ZOFRAN) 4 MG tablet Take 1 tablet (4 mg total) by mouth every 8 (eight) hours as needed for nausea or vomiting. 07/11/15   Chase PicketJaime Pilcher Ward, PA-C   BP 134/85 mmHg  Pulse 75  Temp(Src) 98.4 F (36.9 C) (Oral)  Resp 17  Ht  (1.575 m)  Wt 62.596 kg  BMI 25.23 kg/m2  SpO2 96% Physical Exam  Constitutional: She is oriented to person, place, and time. She appears well-developed and well-nourished.  HENT:  Head: Normocephalic and atraumatic.  Right Ear: Tympanic membrane, external ear and ear canal normal.  Left Ear: Tympanic membrane, external ear and ear canal normal.  Nose: Nose  normal.  Mouth/Throat: Uvula is midline, oropharynx is clear and moist and mucous membranes are normal.  Eyes: Conjunctivae, EOM and lids are normal. Pupils are equal, round, and reactive to light. Right eye exhibits no nystagmus. Left eye exhibits no nystagmus.  Neck: Normal range of motion. Neck supple.  Cardiovascular: Normal rate and regular rhythm.   Pulmonary/Chest: Effort normal and breath sounds normal.  Abdominal: Soft. There is no tenderness.  Musculoskeletal: She exhibits no tenderness.       Cervical back: She exhibits normal range of motion, no tenderness and no bony tenderness.  No cervical spine tenderness to palpation.   Neurological: She is alert and oriented to person, place, and time. She has normal strength and normal reflexes. No cranial nerve deficit or sensory deficit. She displays a negative Romberg sign. Coordination and gait normal. GCS eye subscore is 4. GCS verbal subscore is 5. GCS motor subscore is 6.  Skin: Skin is warm and dry.  Psychiatric: She has a normal mood and affect.  Nursing note and vitals reviewed.   ED Course  Procedures (including critical care time) Labs Review Labs Reviewed  URINALYSIS, ROUTINE W REFLEX MICROSCOPIC (NOT AT [facility])    Imaging Review Ct Head Wo Contrast  08/05/2015  CLINICAL DATA:  Fall EXAM: CT HEAD WITHOUT CONTRAST TECHNIQUE: Contiguous axial images were obtained from the base of the skull through the vertex without intravenous contrast. COMPARISON:  07/24/2015 FINDINGS: Cerebral atrophy.  Negative for hydrocephalus. Mild chronic microvascular ischemic change in the white matter. Small chronic infarct right parietal lobe is stable. Negative for acute infarct. Negative for intracranial hemorrhage. No mass or edema. Right parietal burr holes. Negative for skull fracture. Retention cyst right maxillary sinus. IMPRESSION: No acute abnormality and no change from the recent study. Electronically Signed   By: Marlan Palau M.D.   On:  08/05/2015 23:32   I have personally reviewed and evaluated these images and lab results as part of my medical decision-making.   EKG Interpretation None       12:34 AM Patient seen and examined. Informed of CT results. Awaiting UA given history.    Vital signs reviewed and are as follows: BP 134/85 mmHg  Pulse 75  Temp(Src) 98.4 F (36.9 C) (Oral)  Resp 17  Ht  (1.575 m)  Wt 62.596 kg  BMI 25.23 kg/m2  SpO2 96%  1:05 AM Patient has ambulated with minimal assistance. UA pending. Upstill PA-C to follow-up on results.   MDM   Final diagnoses:  Dizziness  Minor head injury, initial encounter   Patient with fall, minor head injury, negative CT scan after mechanical fall. No neck pain. Pt ambulatory at baseline. Prior ED visits for same. She has been evaluated for home health assistance as well. No indications for further workup with imaging at this time. No focal neuro deficits. No features concerning for arrhythmia.    Renne Crigler, PA-C 08/06/15 0110  Tomasita Crumble, MD 08/06/15 [phone]

## 2015-08-08 LAB — URINE CULTURE

## 2015-08-14 ENCOUNTER — Other Ambulatory Visit: Payer: Self-pay | Admitting: Neurology

## 2015-09-12 ENCOUNTER — Other Ambulatory Visit: Payer: Self-pay | Admitting: Neurology

## 2015-09-24 ENCOUNTER — Ambulatory Visit (INDEPENDENT_AMBULATORY_CARE_PROVIDER_SITE_OTHER): Payer: PRIVATE HEALTH INSURANCE | Admitting: Neurology

## 2015-09-24 ENCOUNTER — Encounter: Payer: Self-pay | Admitting: Neurology

## 2015-09-24 VITALS — BP 110/70 | HR 84 | Resp 20 | Ht 62.0 in | Wt 130.0 lb

## 2015-09-24 DIAGNOSIS — F3160 Bipolar disorder, current episode mixed, unspecified: Secondary | ICD-10-CM | POA: Insufficient documentation

## 2015-09-24 DIAGNOSIS — R561 Post traumatic seizures: Secondary | ICD-10-CM | POA: Diagnosis not present

## 2015-09-24 DIAGNOSIS — S069X3D Unspecified intracranial injury with loss of consciousness of 1 hour to 5 hours 59 minutes, subsequent encounter: Secondary | ICD-10-CM | POA: Diagnosis not present

## 2015-09-24 DIAGNOSIS — F1010 Alcohol abuse, uncomplicated: Secondary | ICD-10-CM | POA: Insufficient documentation

## 2015-09-24 DIAGNOSIS — F429 Obsessive-compulsive disorder, unspecified: Secondary | ICD-10-CM

## 2015-09-24 MED ORDER — LAMOTRIGINE 100 MG PO TABS: 100.0000 mg | ORAL_TABLET | Freq: Two times a day (BID) | ORAL | Status: DC

## 2015-09-24 NOTE — Patient Instructions (Signed)
Valproic Acid, Divalproex Sodium sprinkle capsule What is this medicine? DIVALPROEX SODIUM (dye VAL pro ex SO dee um) is used to treat certain types of seizures in patients with epilepsy. This medicine may be used for other purposes; ask your health care provider or pharmacist if you have questions. What should I tell my health care provider before I take this medicine? They need to know if you have any of these conditions: -blood disease -brain damage or disease -kidney disease -liver disease -low blood proteins -mitochondrial disease -suicidal thoughts, plans, or attempt; a previous suicide attempt by you or a family member -urea cycle disorder (UCD) -an unusual or allergic reaction to divalproex sodium, other medicines, foods, dyes, or preservatives -pregnant or trying to get pregnant -breast-feeding How should I use this medicine? Take this medicine by mouth. It can be swallowed whole or the capsules may be opened carefully and the contents sprinkled on about one teaspoonful of applesauce or pudding. This mixture must be swallowed immediately. Do not chew or store for later use. Follow the directions on the prescription label. Take your doses at regular intervals. Do not take your medicine more often than directed. Do not stop taking this medicine unless instructed by your doctor or health care professional. Stopping your medicine suddenly can increase your seizures or their severity. Talk to your pediatrician regarding the use of this medicine in children. Special care may be needed. Overdosage: If you think you have taken too much of this medicine contact a poison control center or emergency room at once. NOTE: This medicine is only for you. Do not share this medicine with others. What if I miss a dose? If you miss a dose, take it as soon as you can. If it is almost time for your next dose, take only that dose. Do not take double or extra doses. What may interact with this  medicine? -aspirin -barbiturates, like phenobarbital -diazepam -isoniazid -medicines for depression, anxiety, or psychotic disturbances -medicines that treat or prevent blood clots like warfarin -meropenem -other seizure medicines -rifampin -tolbutamide -zidovudine This list may not describe all possible interactions. Give your health care provider a list of all the medicines, herbs, non-prescription drugs, or dietary supplements you use. Also tell them if you smoke, drink alcohol, or use illegal drugs. Some items may interact with your medicine. What should I watch for while using this medicine? Visit your doctor or health care professional for regular checks on your progress. Wear a Probation officer or necklace. Carry an identification card with information about your condition, medications, and doctor or health care professional. Dennis Bast may get drowsy, dizzy, or have blurred vision. Do not drive, use machinery, or do anything that needs mental alertness until you know how this medicine affects you. To reduce dizzy or fainting spells, do not sit or stand up quickly, especially if you are an older patient. Alcohol can increase drowsiness and dizziness. Avoid alcoholic drinks. This medicine can cause blood problems. This can mean slow healing and a risk of infection. Problems can arise if you need dental work, and in the day to day care of your teeth. Try to avoid damage to your teeth and gums when you brush or floss your teeth. This medicine can make you more sensitive to the sun. Keep out of the sun. If you cannot avoid being in the sun, wear protective clothing and use sunscreen. Do not use sun lamps or tanning beds/booths. The use of this medicine may increase the chance of suicidal  thoughts or actions. Pay special attention to how you are responding while on this medicine. Any worsening of mood, or thoughts of suicide or dying should be reported to your health care professional right  away. Women who become pregnant while using this medicine may enroll in the [place] Antiepileptic Drug Pregnancy Registry by calling [phone]. This registry collects information about the safety of antiepileptic drug use during pregnancy. Contact your doctor or healthcare professional if you notice any part of your medicine in your stool. Your healthcare provider may want to check the amount of medicine in your blood if this happens. What side effects may I notice from receiving this medicine? Side effects that you should report to your doctor or health care professional as soon as possible: -allergic reactions like skin rash, itching or hives, swelling of the face, lips, or tongue -changes in the frequency or severity of seizures -double vision or uncontrollable eye movements -nausea and vomiting -redness, blistering, peeling or loosening of the skin, including inside the mouth -stomach pain or cramps -trembling of hands or arms -unusual bleeding or bruising or pinpoint red spots on the skin -unusual swelling of the arms or legs -unusually weak or tired -worsening of mood, thoughts or actions of suicide or dying -yellowing of skin or eyes Side effects that usually do not require medical attention (report to your doctor or health care professional if they continue or are bothersome): -change in menstrual cycle -diarrhea or constipation -headache -loss of bladder control -loss of hair or unusual growth of hair -loss or increase in appetite -weight gain or loss This list may not describe all possible side effects. Call your doctor for medical advice about side effects. You may report side effects to FDA at 1-800-FDA-1088. Where should I keep my medicine? Keep out of reach of children. Store at room temperature below 25 degrees C (77 degrees F). Keep container tightly closed. Throw away any unused medicine after the expiration date. NOTE: This sheet is a summary. It may not  cover all possible information. If you have questions about this medicine, talk to your doctor, pharmacist, or health care provider.    2016, Elsevier/[employer] Standard. (2012-01-10 11:53:28)
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
## 2015-09-25 ENCOUNTER — Telehealth: Payer: Self-pay | Admitting: *Deleted

## 2015-09-25 NOTE — Telephone Encounter (Signed)
HH agency called needing PCP number.

## 2015-10-04 ENCOUNTER — Emergency Department (HOSPITAL_COMMUNITY)
Admission: EM | Admit: 2015-10-04 | Discharge: 2015-10-04 | Disposition: A | Discharge status: Home / Self Care | Payer: PRIVATE HEALTH INSURANCE | Attending: Emergency Medicine | Admitting: Emergency Medicine

## 2015-10-04 ENCOUNTER — Emergency Department (HOSPITAL_COMMUNITY): Payer: PRIVATE HEALTH INSURANCE

## 2015-10-04 ENCOUNTER — Encounter (HOSPITAL_COMMUNITY): Payer: Self-pay | Admitting: Emergency Medicine

## 2015-10-04 DIAGNOSIS — E079 Disorder of thyroid, unspecified: Secondary | ICD-10-CM | POA: Diagnosis not present

## 2015-10-04 DIAGNOSIS — Z791 Long term (current) use of non-steroidal anti-inflammatories (NSAID): Secondary | ICD-10-CM | POA: Diagnosis not present

## 2015-10-04 DIAGNOSIS — R0789 Other chest pain: Secondary | ICD-10-CM

## 2015-10-04 DIAGNOSIS — Y998 Other external cause status: Secondary | ICD-10-CM | POA: Diagnosis not present

## 2015-10-04 DIAGNOSIS — Z792 Long term (current) use of antibiotics: Secondary | ICD-10-CM | POA: Diagnosis not present

## 2015-10-04 DIAGNOSIS — Z87820 Personal history of traumatic brain injury: Secondary | ICD-10-CM | POA: Insufficient documentation

## 2015-10-04 DIAGNOSIS — Z880 Allergy status to penicillin: Secondary | ICD-10-CM | POA: Diagnosis not present

## 2015-10-04 DIAGNOSIS — Z79899 Other long term (current) drug therapy: Secondary | ICD-10-CM | POA: Insufficient documentation

## 2015-10-04 DIAGNOSIS — S3991XA Unspecified injury of abdomen, initial encounter: Secondary | ICD-10-CM | POA: Diagnosis not present

## 2015-10-04 DIAGNOSIS — S29001A Unspecified injury of muscle and tendon of front wall of thorax, initial encounter: Secondary | ICD-10-CM | POA: Diagnosis not present

## 2015-10-04 DIAGNOSIS — W01198A Fall on same level from slipping, tripping and stumbling with subsequent striking against other object, initial encounter: Secondary | ICD-10-CM | POA: Insufficient documentation

## 2015-10-04 DIAGNOSIS — Y92121 Bathroom in nursing home as the place of occurrence of the external cause: Secondary | ICD-10-CM | POA: Insufficient documentation

## 2015-10-04 DIAGNOSIS — Y9389 Activity, other specified: Secondary | ICD-10-CM | POA: Diagnosis not present

## 2015-10-04 DIAGNOSIS — F329 Major depressive disorder, single episode, unspecified: Secondary | ICD-10-CM | POA: Insufficient documentation

## 2015-10-04 MED ORDER — DIAZEPAM 5 MG PO TABS
5.0000 mg | ORAL_TABLET | Freq: Once | ORAL | Status: AC
  Administered 2015-10-04: 5 mg via ORAL
  Filled 2015-10-04: qty 1

## 2015-10-04 MED ORDER — METHOCARBAMOL 500 MG PO TABS: 500.0000 mg | ORAL_TABLET | Freq: Two times a day (BID) | ORAL | Status: DC

## 2015-10-04 MED ORDER — KETOROLAC TROMETHAMINE 60 MG/2ML IM SOLN
30.0000 mg | Freq: Once | INTRAMUSCULAR | Status: AC
  Administered 2015-10-04: 30 mg via INTRAMUSCULAR
  Filled 2015-10-04: qty 2

## 2015-10-04 NOTE — ED Notes (Signed)
Discharge instructions, follow up care, and rx x1 reviewed with patient. Patient verbalized understanding. 

## 2015-10-04 NOTE — Discharge Instructions (Signed)

## 2015-10-04 NOTE — ED Notes (Signed)
Patient transported to X-ray 

## 2015-10-04 NOTE — ED Notes (Signed)
Per EMS, patient usually uses a walker, patient was stopped using her walker and slipped and fell in bathroom. Her side hit the toilet, complaining of left flank pain 10/10 Denies head, neck, or back pain. Alert, oriented x4. [facility] from [place] [facility]

## 2015-10-04 NOTE — ED Provider Notes (Signed)
CSN: [id number]     Arrival date & time 10/04/15  1405 History   First MD Initiated Contact with Patient 10/04/15 1419     Chief Complaint  Patient presents with  . Fall  . Flank Pain     (Consider location/radiation/quality/duration/timing/severity/associated sxs/prior Treatment) HPI Comments: Patient here after mechanical fall the nursing home. States she was running to the bathroom and do not have her walker and she slipped in the bathroom and fell onto the left side. Denies any loss of consciousness. No head or neck or back pain. Complains of left lateral lower rib pain characterized as sharp and worse with taking deep breath. Denies any left upper quadrant pain. No vomiting appreciated. Pain characterized as sharp and worse with movement and better with remaining still. No treatment use [facility].  Patient is a 65 y.o. female presenting with fall and flank pain. The history is provided by the patient and the EMS personnel.  Fall  Flank Pain    Past Medical History  Diagnosis Date  . Depression   . Seizures (HCC)   . Allergy   . Thyroid disease   . Traumatic brain injury [facility](HCC)    History reviewed. No pertinent past surgical history. No family history on file. Social History  Substance Use Topics  . Smoking status: Never Smoker   . Smokeless tobacco: None  . Alcohol Use: No   OB History    No data available     Review of Systems  Genitourinary: Positive for flank pain.  All other systems reviewed and are negative.     Allergies  Penicillins and Sulfa antibiotics  Home Medications   Prior to Admission medications   Medication Sig Start Date End Date Taking? Authorizing Provider  ciprofloxacin (CIPRO) 500 MG tablet Take 1 tablet (500 mg total) by mouth 2 (two) times daily. 08/06/15   Elpidio AnisShari Upstill, PA-C  desvenlafaxine (PRISTIQ) 100 MG 24 hr tablet Take 100 mg by mouth daily.     Historical Provider, MD  divalproex  (DEPAKOTE) 500 MG DR tablet TAKE ONE TABLET EVERY MORNING AND TAKE 2TABLETS AT NIGHT 08/17/15   Melvyn Novasarmen Dohmeier, MD  ibuprofen (ADVIL,MOTRIN) 200 MG tablet Take 200 mg by mouth daily.    Historical Provider, MD  lamoTRIgine (LAMICTAL) 100 MG tablet Take 1 tablet (100 mg total) by mouth 2 (two) times daily. 09/24/15   Porfirio Mylararmen Dohmeier, MD  levothyroxine (SYNTHROID, LEVOTHROID) 75 MCG tablet Take 75 mcg by mouth daily.    Historical Provider, MD  ondansetron (ZOFRAN) 4 MG tablet Take 1 tablet (4 mg total) by mouth every 8 (eight) hours as needed for nausea or vomiting. 07/11/15   Chase PicketJaime Pilcher Ward, PA-C   Pulse 66  Temp(Src) 97.7 F (36.5 C) (Oral)  Resp 18  Ht 5\' 3"  (1.6 m)  Wt 58.968 kg  BMI 23.03 kg/m2  SpO2 100% Physical Exam  Constitutional: She is oriented to person, place, and time. She appears well-developed and well-nourished.  Non-toxic appearance. No distress.  HENT:  Head: Normocephalic and atraumatic.  Eyes: Conjunctivae, EOM and lids are normal. Pupils are equal, round, and reactive to light.  Neck: Normal range of motion. Neck supple. No tracheal deviation present. No thyroid mass present.  Cardiovascular: Normal rate, regular rhythm and normal heart sounds.  Exam reveals no gallop.   No murmur heard. Pulmonary/Chest: Effort normal and breath sounds normal. No stridor. No respiratory distress. She has no decreased breath sounds. She has  no wheezes. She has no rhonchi. She has no rales. She exhibits bony tenderness. She exhibits no crepitus.    Abdominal: Soft. Normal appearance and bowel sounds are normal. She exhibits no distension. There is no tenderness. There is no rebound and no CVA tenderness.  Musculoskeletal: Normal range of motion. She exhibits no edema or tenderness.  Neurological: She is alert and oriented to person, place, and time. She has normal strength. No cranial nerve deficit or sensory deficit. GCS eye subscore is 4. GCS verbal subscore is 5. GCS motor subscore  is 6.  Skin: Skin is warm and dry. No abrasion and no rash noted.  Psychiatric: She has a normal mood and affect. Her speech is normal and behavior is normal.  Nursing note and vitals reviewed.   ED Course  Procedures (including critical care time) Labs Review Labs Reviewed - No data to display  Imaging Review No results found. I have personally reviewed and evaluated these images and lab results as part of my medical decision-making.   EKG Interpretation None      MDM   Final diagnoses:  None    No evidence of rib fracture on x-ray. Given Toradol and Valium here and feels better. Will prescribe Robaxin discharged home    Lorre Nick, MD 10/04/15 [phone]

## 2015-10-04 NOTE — ED Notes (Signed)
Bed: WA12 Expected date:  Expected time:  Means of arrival:  Comments: EMS 

## 2015-10-25 ENCOUNTER — Emergency Department (HOSPITAL_COMMUNITY)
Admission: EM | Admit: 2015-10-25 | Discharge: 2015-10-26 | Disposition: A | Discharge status: Home / Self Care | Payer: PRIVATE HEALTH INSURANCE | Attending: Emergency Medicine | Admitting: Emergency Medicine

## 2015-10-25 ENCOUNTER — Emergency Department (HOSPITAL_COMMUNITY): Payer: PRIVATE HEALTH INSURANCE

## 2015-10-25 ENCOUNTER — Encounter (HOSPITAL_COMMUNITY): Payer: Self-pay | Admitting: Emergency Medicine

## 2015-10-25 DIAGNOSIS — R110 Nausea: Secondary | ICD-10-CM | POA: Insufficient documentation

## 2015-10-25 DIAGNOSIS — Z79899 Other long term (current) drug therapy: Secondary | ICD-10-CM | POA: Insufficient documentation

## 2015-10-25 DIAGNOSIS — N390 Urinary tract infection, site not specified: Secondary | ICD-10-CM | POA: Diagnosis not present

## 2015-10-25 DIAGNOSIS — Z791 Long term (current) use of non-steroidal anti-inflammatories (NSAID): Secondary | ICD-10-CM | POA: Insufficient documentation

## 2015-10-25 DIAGNOSIS — R42 Dizziness and giddiness: Secondary | ICD-10-CM | POA: Diagnosis present

## 2015-10-25 LAB — DIFFERENTIAL
Basophils Absolute: 0 10*3/uL (ref 0.0–0.1)
Basophils Relative: 0 %
EOS PCT: 0 %
Eosinophils Absolute: 0 10*3/uL (ref 0.0–0.7)
LYMPHS ABS: 1.8 10*3/uL (ref 0.7–4.0)
LYMPHS PCT: 27 %
Monocytes Absolute: 0.7 10*3/uL (ref 0.1–1.0)
Monocytes Relative: 11 %
NEUTROS PCT: 62 %
Neutro Abs: 4 10*3/uL (ref 1.7–7.7)

## 2015-10-25 LAB — BASIC METABOLIC PANEL
Anion gap: 10 (ref 5–15)
BUN: 10 mg/dL (ref 6–20)
CALCIUM: 9.9 mg/dL (ref 8.9–10.3)
CO2: 27 mmol/L (ref 22–32)
CREATININE: 0.52 mg/dL (ref 0.44–1.00)
Chloride: 93 mmol/L — ABNORMAL LOW (ref 101–111)
GFR calc Af Amer: >60 mL/min (ref >60)
GLUCOSE: 97 mg/dL (ref 65–99)
POTASSIUM: 4.3 mmol/L (ref 3.5–5.1)
SODIUM: 130 mmol/L — AB (ref 135–145)

## 2015-10-25 LAB — CBC
HEMATOCRIT: 40.7 % (ref 36.0–46.0)
Hemoglobin: 14.1 g/dL (ref 12.0–15.0)
MCH: 31.3 pg (ref 26.0–34.0)
MCHC: 34.6 g/dL (ref 30.0–36.0)
MCV: 90.4 fL (ref 78.0–100.0)
PLATELETS: 181 10*3/uL (ref 150–400)
RBC: 4.5 MIL/uL (ref 3.87–5.11)
RDW: 12.7 % (ref 11.5–15.5)
WBC: 6.5 10*3/uL (ref 4.0–10.5)

## 2015-10-25 LAB — URINE MICROSCOPIC-ADD ON: RBC / HPF: NONE SEEN RBC/hpf (ref 0–5)

## 2015-10-25 LAB — URINALYSIS, ROUTINE W REFLEX MICROSCOPIC
BILIRUBIN URINE: NEGATIVE
Glucose, UA: NEGATIVE mg/dL
Hgb urine dipstick: NEGATIVE
KETONES UR: NEGATIVE mg/dL
NITRITE: NEGATIVE
PH: 7 (ref 5.0–8.0)
PROTEIN: NEGATIVE mg/dL
Specific Gravity, Urine: 1.01 (ref 1.005–1.030)

## 2015-10-25 LAB — ETHANOL: Alcohol, Ethyl (B): <5 mg/dL (ref <5)

## 2015-10-25 LAB — PROTIME-INR
INR: 0.9 (ref 0.00–1.49)
Prothrombin Time: 12.4 seconds (ref 11.6–15.2)

## 2015-10-25 LAB — CBG MONITORING, ED: Glucose-Capillary: 113 mg/dL — ABNORMAL HIGH (ref 65–99)

## 2015-10-25 MED ORDER — ONDANSETRON 8 MG PO TBDP: 8.0000 mg | ORAL_TABLET | Freq: Three times a day (TID) | ORAL | Status: DC | PRN

## 2015-10-25 MED ORDER — CIPROFLOXACIN HCL 500 MG PO TABS
500.0000 mg | ORAL_TABLET | Freq: Once | ORAL | Status: AC
Start: 2015-10-25 — End: 2015-10-25
  Administered 2015-10-25: 500 mg via ORAL
  Filled 2015-10-25: qty 1

## 2015-10-25 MED ORDER — CIPROFLOXACIN HCL 500 MG PO TABS: 500.0000 mg | ORAL_TABLET | Freq: Two times a day (BID) | ORAL | Status: DC

## 2015-10-25 MED ORDER — ONDANSETRON HCL 4 MG/2ML IJ SOLN
4.0000 mg | Freq: Once | INTRAMUSCULAR | Status: AC
  Administered 2015-10-25: 4 mg via INTRAVENOUS
  Filled 2015-10-25: qty 2

## 2015-10-25 NOTE — Discharge Instructions (Signed)
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
## 2015-10-25 NOTE — ED Notes (Addendum)
Pt BIB EMS from [facility] for c/o nausea and dizziness; per nursing home, pt had onset of unsteady gait about one hour ago; increased weakness and dizziness also reported; pt reports nausea for past couple days at a "normal" amount; pt states her nausea, dizziness, and lethargy has been increasing for the past week as bedtime approaches; pt has hx of TBI and seizures

## 2015-10-25 NOTE — ED Notes (Signed)
Pt states she has been gritting teeth this evening and is concerned about this being an aura for a seizure

## 2015-10-25 NOTE — ED Notes (Signed)
  Pt transported to ct 

## 2015-10-27 LAB — URINE CULTURE

## 2015-11-10 ENCOUNTER — Encounter: Payer: Self-pay | Admitting: Neurology

## 2015-11-10 ENCOUNTER — Ambulatory Visit (INDEPENDENT_AMBULATORY_CARE_PROVIDER_SITE_OTHER): Payer: PRIVATE HEALTH INSURANCE | Admitting: Neurology

## 2015-11-10 VITALS — BP 98/68 | HR 86 | Resp 20 | Ht 63.0 in | Wt 130.0 lb

## 2015-11-10 DIAGNOSIS — F1010 Alcohol abuse, uncomplicated: Secondary | ICD-10-CM | POA: Diagnosis not present

## 2015-11-10 DIAGNOSIS — F1011 Alcohol abuse, in remission: Secondary | ICD-10-CM | POA: Insufficient documentation

## 2015-11-10 DIAGNOSIS — G309 Alzheimer's disease, unspecified: Secondary | ICD-10-CM

## 2015-11-10 DIAGNOSIS — S069X5D Unspecified intracranial injury with loss of consciousness greater than 24 hours with return to pre-existing conscious level, subsequent encounter: Secondary | ICD-10-CM

## 2015-11-10 DIAGNOSIS — F429 Obsessive-compulsive disorder, unspecified: Secondary | ICD-10-CM

## 2015-11-10 DIAGNOSIS — F0280 Dementia in other diseases classified elsewhere without behavioral disturbance: Secondary | ICD-10-CM

## 2015-11-10 DIAGNOSIS — G308 Other Alzheimer's disease: Secondary | ICD-10-CM | POA: Diagnosis not present

## 2015-11-10 MED ORDER — DIVALPROEX SODIUM 500 MG PO DR TAB: 500.0000 mg | DELAYED_RELEASE_TABLET | Freq: Three times a day (TID) | ORAL | Status: DC

## 2015-11-10 MED ORDER — LAMOTRIGINE 100 MG PO TABS: 100.0000 mg | ORAL_TABLET | Freq: Two times a day (BID) | ORAL | Status: DC
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
## 2015-11-26 ENCOUNTER — Telehealth: Payer: Self-pay

## 2015-11-26 NOTE — Telephone Encounter (Signed)
I spoke to Shannon Barry and advised her that her September 13th appt with [name], NP needs to be rescheduled. Shannon Barry is agreeable to coming in on 02/08/16 at 3:30. I went over this appt date and time several times with the Shannon Barry until she could correctly read it back to me. Shannon Barry verbalized understanding of change of appt and of new appt date and time.

## 2015-12-28 ENCOUNTER — Emergency Department (HOSPITAL_COMMUNITY): Payer: PRIVATE HEALTH INSURANCE

## 2015-12-28 ENCOUNTER — Emergency Department (HOSPITAL_COMMUNITY)
Admission: EM | Admit: 2015-12-28 | Discharge: 2015-12-28 | Disposition: A | Discharge status: Home / Self Care | Payer: PRIVATE HEALTH INSURANCE | Attending: Emergency Medicine | Admitting: Emergency Medicine

## 2015-12-28 ENCOUNTER — Encounter (HOSPITAL_COMMUNITY): Payer: Self-pay | Admitting: *Deleted

## 2015-12-28 DIAGNOSIS — G309 Alzheimer's disease, unspecified: Secondary | ICD-10-CM | POA: Diagnosis not present

## 2015-12-28 DIAGNOSIS — Y999 Unspecified external cause status: Secondary | ICD-10-CM | POA: Diagnosis not present

## 2015-12-28 DIAGNOSIS — Z79899 Other long term (current) drug therapy: Secondary | ICD-10-CM | POA: Insufficient documentation

## 2015-12-28 DIAGNOSIS — W1839XA Other fall on same level, initial encounter: Secondary | ICD-10-CM | POA: Diagnosis not present

## 2015-12-28 DIAGNOSIS — Y939 Activity, unspecified: Secondary | ICD-10-CM | POA: Diagnosis not present

## 2015-12-28 DIAGNOSIS — S299XXA Unspecified injury of thorax, initial encounter: Secondary | ICD-10-CM | POA: Diagnosis present

## 2015-12-28 DIAGNOSIS — Y92003 Bedroom of unspecified non-institutional (private) residence as the place of occurrence of the external cause: Secondary | ICD-10-CM | POA: Insufficient documentation

## 2015-12-28 DIAGNOSIS — S2232XA Fracture of one rib, left side, initial encounter for closed fracture: Secondary | ICD-10-CM | POA: Insufficient documentation

## 2015-12-28 LAB — CBC
HCT: 40.3 % (ref 36.0–46.0)
Hemoglobin: 14 g/dL (ref 12.0–15.0)
MCH: 32.1 pg (ref 26.0–34.0)
MCHC: 34.7 g/dL (ref 30.0–36.0)
MCV: 92.4 fL (ref 78.0–100.0)
PLATELETS: 160 10*3/uL (ref 150–400)
RBC: 4.36 MIL/uL (ref 3.87–5.11)
RDW: 13.3 % (ref 11.5–15.5)
WBC: 10.8 10*3/uL — AB (ref 4.0–10.5)

## 2015-12-28 LAB — BASIC METABOLIC PANEL
Anion gap: 11 (ref 5–15)
BUN: 11 mg/dL (ref 6–20)
CALCIUM: 9 mg/dL (ref 8.9–10.3)
CO2: 22 mmol/L (ref 22–32)
CREATININE: 0.57 mg/dL (ref 0.44–1.00)
Chloride: 102 mmol/L (ref 101–111)
Glucose, Bld: 94 mg/dL (ref 65–99)
Potassium: 4.7 mmol/L (ref 3.5–5.1)
SODIUM: 135 mmol/L (ref 135–145)

## 2015-12-28 LAB — ETHANOL

## 2015-12-28 MED ORDER — OXYCODONE-ACETAMINOPHEN 5-325 MG PO TABS
1.0000 | ORAL_TABLET | Freq: Once | ORAL | Status: AC
  Administered 2015-12-28: 1 via ORAL
  Filled 2015-12-28: qty 1

## 2015-12-28 MED ORDER — KETOROLAC TROMETHAMINE 30 MG/ML IJ SOLN
30.0000 mg | Freq: Once | INTRAMUSCULAR | Status: AC
  Administered 2015-12-28: 30 mg via INTRAVENOUS
  Filled 2015-12-28: qty 1

## 2015-12-28 MED ORDER — HYDROCODONE-ACETAMINOPHEN 5-325 MG PO TABS: 1.0000 | ORAL_TABLET | ORAL | 0 refills | Status: DC | PRN

## 2015-12-28 NOTE — ED Triage Notes (Addendum)
Per EMS, Pt from [facility], pt was in the bathroom, lost her balance and landed on her L rib cage. When palpated by EMS, EMS felt a "crunch" on L lower rib. Pt has C Collar on after expressing c/o neck pain that movement made rib pain worse. Denies head injury, no LOC. A&Ox4. Pt c/o pain with talking. Pt received 150 fentanyl en route. Pt has 20 L wrist. Pt sts she is a recovering alcoholic and wants no more pain medication other than what she has already received.

## 2015-12-28 NOTE — ED Notes (Signed)
Lab draw attempted by RN and phlebotomy All attempts unsuccessful Main Lab called by RN

## 2015-12-28 NOTE — ED Provider Notes (Signed)
[facility]-EMERGENCY DEPT Provider Note   CSN: [id number] Arrival date & time: 12/28/15  1014  First Provider Contact:  First MD Initiated Contact with Patient 12/28/15 1055        History   Chief Complaint Rib pain  HPI Shannon Barry is a 65 y.o. female.  Patient presents to the emergency department from her assisted living facility where she lost her balance and reports that she injured her left lateral chest.  Patient denies neck pain or head injury for me.  She received 150 g of fentanyl and route of reports she has some improved pain at this time but still needs something for pain.  She denies pain to her hips or knees.  She has no other complaints at this time.  She can provide the majority of her history despite her history of dementia per the medical record   The history is provided by the patient.    Past Medical History:  Diagnosis Date  . Allergy   . Depression   . Seizures (HCC)   . Thyroid disease   . Traumatic brain injury [facility])     Patient Active Problem List   Diagnosis Date Noted  . History of alcohol abuse 11/10/2015  . Alzheimer's disease 11/10/2015  . 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 Alcohol abuse 09/24/2015  . OCD (obsessive compulsive disorder) 09/24/2015  . Hyponatremia 07/11/2015  . Chest pain 07/11/2015  . Nausea and vomiting 07/11/2015  . Seizures (HCC) 07/11/2015  . Vertigo   . 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 TBI (traumatic brain injury) (HCC) 05/04/2015  . 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 Dementia with behavioral disturbance 03/18/2015  . 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  OB History    No data available       Home Medications    Prior to Admission medications   Medication Sig Start Date End Date Taking? Authorizing Provider  Calcium Carbonate-Vitamin D  (CALCIUM-VITAMIN D) 600-125 MG-UNIT TABS Take 1 tablet by mouth daily.   Yes Historical Provider, MD  desvenlafaxine (PRISTIQ) 100 MG 24 hr tablet Take 50 mg by mouth daily.    Yes Historical Provider, MD  divalproex (DEPAKOTE) 500 MG DR tablet Take 1 tablet (500 mg total) by mouth 3 (three) times daily. 11/10/15  Yes Carmen Dohmeier, MD  ibuprofen (ADVIL,MOTRIN) 200 MG tablet Take 200 mg by mouth 2 (two) times daily.    Yes Historical Provider, MD  lamoTRIgine (LAMICTAL) 100 MG tablet Take 1 tablet (100 mg total) by mouth 2 (two) times daily. 11/10/15  Yes Carmen Dohmeier, MD  levothyroxine (SYNTHROID, LEVOTHROID) 75 MCG tablet Take 75 mcg by mouth daily.   Yes Historical Provider, MD  HYDROcodone-acetaminophen (NORCO/VICODIN) 5-325 MG tablet Take 1 tablet by mouth every 4 (four) hours as needed for moderate pain. 12/28/15   Azalia Bilis, MD  methocarbamol (ROBAXIN) 500 MG tablet Take 1 tablet (500 mg total) by mouth 2 (two) times daily. Patient not taking: Reported on 12/28/2015 10/04/15   Lorre Nick, MD  ondansetron (ZOFRAN ODT) 8 MG disintegrating tablet Take 1 tablet (8 mg total) by mouth every 8 (eight) hours as needed for nausea or vomiting. Patient not taking: Reported on 12/28/2015 10/25/15   Dione Booze, MD  ondansetron (ZOFRAN) 4 MG tablet Take 1 tablet (4 mg total) by mouth every 8 (eight) hours as needed for nausea  or vomiting. Patient not taking: Reported on 12/28/2015 07/11/15   Chase Picket Ward, PA-C    Family History No family history on file.  Social History Social History  Substance Use Topics  . Smoking status: Never Smoker  . Smokeless tobacco: Not on file  . Alcohol use No     Allergies   Penicillins and Sulfa antibiotics   Review of Systems Review of Systems  All other systems reviewed and are negative.    Physical Exam Updated Vital Signs BP 121/82 (BP Location: Left Arm)   Pulse 81   Temp 97.5 F (36.4 C) (Oral)   Resp 18   SpO2 95%   Physical Exam    Constitutional: She is oriented to person, place, and time. She appears well-developed and well-nourished. No distress.  HENT:  Head: Normocephalic and atraumatic.  Eyes: EOM are normal.  Neck: Normal range of motion. Neck supple.  No C-spine tenderness  Cardiovascular: Normal rate, regular rhythm and normal heart sounds.   Pulmonary/Chest: Effort normal and breath sounds normal.  Left lateral chest wall tenderness without crepitus  Abdominal: Soft. She exhibits no distension. There is no tenderness.  Musculoskeletal: Normal range of motion. She exhibits no deformity.  Full range of motion of bilateral ankles, knees, hips.  Full range of motion of bilateral wrists, elbows, shoulders.  No thoracic or lumbar point tenderness  Neurological: She is alert and oriented to person, place, and time.  Skin: Skin is warm and dry.  Psychiatric: She has a normal mood and affect. Judgment normal.  Nursing note and vitals reviewed.    ED Treatments / Results  Labs (all labs ordered are listed, but only abnormal results are displayed) Labs Reviewed  CBC - Abnormal; Notable for the following:       Result Value   WBC 10.8 (*)    All other components within normal limits  ETHANOL  BASIC METABOLIC PANEL    EKG  EKG Interpretation None       Radiology Dg Ribs Unilateral W/chest Left  Result Date: 12/28/2015 CLINICAL DATA:  Pain following fall EXAM: LEFT RIBS AND CHEST - 3+ VIEW COMPARISON:  Oct 04, 2015 FINDINGS: Frontal chest as well as oblique and cone-down lateral rib images were obtained. There are displaced fractures of posterior left fifth, sixth, and seventh ribs. There is a displaced left lateral eighth rib fracture. There is soft tissue air laterally on the left. There is no evident pneumothorax. There is mild atelectasis in the left base. No edema or consolidation. Heart size and pulmonary vascularity are normal. No adenopathy. There is evidence of prior surgery involving the lateral  left clavicle. IMPRESSION: Several mildly displaced rib fractures on the left with soft tissue air laterally but no appreciable pneumothorax. Left base atelectasis. Lungs elsewhere clear. Postoperative change left clavicle laterally. Electronically Signed   By: Bretta Bang III M.D.   On: 12/28/2015 11:26   Procedures Procedures (including critical care time)  Medications Ordered in ED Medications  oxyCODONE-acetaminophen (PERCOCET/ROXICET) 5-325 MG per tablet 1 tablet (1 tablet Oral Given 12/28/15 1105)  ketorolac (TORADOL) 30 MG/ML injection 30 mg (30 mg Intravenous Given 12/28/15 1105)     Initial Impression / Assessment and Plan / ED Course  I have reviewed the triage vital signs and the nursing notes.  Pertinent labs & imaging results that were available during my care of the patient were reviewed by me and considered in my medical decision making (see chart for details).  Clinical Course  3:03 PM Patient's pain is improved.  She'll be discharged home with Vicodin.  She has an incentive spirometer given to her by nursing staff.  No other complaints at this time.  C-spine nontender.  No head injury.  Patient understands return to the ER for new or worsening symptoms.  Primary care follow-up  Final Clinical Impressions(s) / ED Diagnoses   Final diagnoses:  Left rib fracture, closed, initial encounter    New Prescriptions New Prescriptions   HYDROCODONE-ACETAMINOPHEN (NORCO/VICODIN) 5-325 MG TABLET    Take 1 tablet by mouth every 4 (four) hours as needed for moderate pain.     Azalia Bilis, MD 12/28/15 [phone]

## 2015-12-28 NOTE — ED Notes (Signed)
Bed: YD74 Expected date:  Expected time:  Means of arrival:  Comments: EMS- 64yo F, unwitnessed fall/rib fracture?

## 2016-02-08 ENCOUNTER — Ambulatory Visit: Payer: Self-pay | Admitting: Adult Health

## 2016-02-08 ENCOUNTER — Telehealth: Payer: Self-pay

## 2016-02-08 NOTE — Telephone Encounter (Signed)
Patient did not show to appt today  

## 2016-02-09 ENCOUNTER — Encounter: Payer: Self-pay | Admitting: Adult Health

## 2016-02-10 ENCOUNTER — Ambulatory Visit: Payer: PRIVATE HEALTH INSURANCE | Admitting: Adult Health

## 2016-02-11 ENCOUNTER — Ambulatory Visit: Payer: PRIVATE HEALTH INSURANCE | Attending: Psychology | Admitting: Psychology

## 2016-02-11 DIAGNOSIS — F0390 Unspecified dementia without behavioral disturbance: Secondary | ICD-10-CM | POA: Insufficient documentation

## 2016-02-11 DIAGNOSIS — F0280 Dementia in other diseases classified elsewhere without behavioral disturbance: Secondary | ICD-10-CM

## 2016-02-12 NOTE — Progress Notes (Signed)
[facility]  [address]   Telephone [phone] [address] Fax [phone] [place], [place] [address]  NEUROPSYCHOLOGICAL CONSULTATION     Name:   Shannon Barry Date of Birth:  02-28-2051 [facility] MR#:  [id number] Date of Evaluation: 02/11/16    Reason for Referral Nazaret Chea is a 65 year-old, right-handed woman with an established diagnosis of dementia who was referred for an assessment of her current cognitive functioning by Alen Blew, MD of [facility].  Sources of Information Electronic medical records from the [facility] were reviewed. Ms. Siegfried and her daughter-in-law, Ms. Marylu Lund, were interviewed.  Background According to Dr. Marianna Payment notes, Ms. 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 (Ms. Granquist reported that she was in a six week coma). She consequently has demonstrated persisting cognitive impairment that has precluded her from working. Approximately twenty years ago she began using alcohol which escalated to a pattern of heavily drinking over the next fifteen years until she became sober about four years ago. She has a seizure disorder and takes anticonvulsant medication. Over the past year her family members have noticed deterioration in her cognitive and gait functioning, the latter resulting in multiple falls. A brain MRI scan on 04/15/15 revealed "right parietal cortical/juxtacortical, right anterior temporal gliosis and encephalomalacia, mild periventricular gliosis and moderate perisylvian and corpus callosum atrophy". Dr. Sunday Shams noted that her brain MRI results suggested an Alzheimer's type dementia. Her chart also listed diagnoses of Bipolar Type 1 disorder and obsessive compulsive disorder. Her current medications include desvenlafaxine, divalproex hydrocodone-acetaminophen as needed, lamotrigine and levothyroxine.   Interview She currently lives in an assisted  living facility. She had initially moved from [place], [place] into an independent living apartment in [place] in November 2016 to be closer to her son. According to her daughter-in-law, she was falling frequently, skipping meals and not remembering to take her medications. In early 2017 she was moved into an assisted living facility and has since done well. Specifically, she is no longer falling (even without using a walker) and is eating regularly. She continues to independently perform her personal care activities and dressing. She has not driven a car for at least ten years. One of her sons manages her financial affairs. Her daughter-in-law reported that to her knowledge Ms. Aversa has not exhibited aggressive, self-destructive, unsafe or bizarre behavior.  She has never heard her express suicidal or homicidal thoughts. She has not seemed to be having hallucinations nor has she expressed delusional ideas.   Ms. Fong asked to be referred to by her nickname of "Boppi". She acknowledged having cognitive difficulties that have persisted since her TBI but could not provide specific examples other than difficulty making decisions. She reported that she struggles to make even simple decisions. It was unclear whether this is a new or old problem. She was aware that she lives in [place] but could not state the name or address of her current residence. When asked about her mood, she described herself as "content' and denied symptoms of mood disturbance. She also described herself as free of anxiety except for worry that she will not get her pet dog back from her family. She denied experiencing mood lability, mania, racing thoughts, suicidal or homicidal ideation. hallucinations and delusional ideas. When she and her daughter-in-law were queried about possible obsessive-compulsive behaviors, Ms. Carruthers reported that she likes her personal belongings to be in the same place and has certain utensils that she favors  but did not reported feeling anxious when things were out of place or when using a different utensil. She denied experiencing obsessional thoughts. She reported her belief that use of Pristiq has been helpful as she reported that when she stopped taking that medication in the past she was prone to cry for no apparent reason. She volunteered that she was diagnosed with Bipolar disorder sometime after her TBI but could not state what symptoms or behaviors this diagnoses was based on. She did recall having often felt "worthless" after her TBI. 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 She denied past or current use of illicit drugs or tobacco products.   She reported that she has been separated from her husband, who is a physician, for about five years. She described their relationship as cordial. She has three adult children. One of her sons, [name], has Power of Attorney status. Prior to her TBI, she had earned a bachelor's degree in in Early Childhood Education.  She reported no history of cognitive or psychiatric problems prior to her TBI.  Observations She appeared as an appropriately groomed and dressed woman of her stated age. She appeared alert and in no apparent physical distress. She walked with a slightly unsteady and narrow based gait without assistive device. She interacted in a pleasant and cooperative manner. Her affect appeared within a wide range and was well-modulated. She did not display signs of emotional distress. Her communication was functional though she tended to ramble while speaking and had difficulty shifting conversational topic. Her thought processes were coherent though mildly disorganized. There were no signs of flight of ideas or frank confusion. Her thought content was devoid of unusual or bizarre ideas.  Evaluation Procedures In addition to a review of medical records and  clinical interview, the following tests or questionnaires were administered:  Brief Neuropsychological Cognitive Examination Patient Health Questionnaire  Wechsler Adult Intelligence Scale-IV: Digit Span  Wechsler Memory Scale-IV: Logical Memory I & II; Symbol Span  Assessment Results Test results were deemed to represent a valid measure of her current cognitive functioning.  She was cooperative with testing procedures. She did not exhibit signs of physical or emotional discomfort. She appeared to maintain adequate alertness and sustain attention to task. She did not report or display problems with vision or hearing.  She displayed a subtle hand tremor that did not seem to disrupt her drawing. She often required task instructions to be repeated due to apparent rapid forgetting. A few times she reverted to following instructions given for a task previously completed. In general her response speed was slow. Difficulty with visual-perceptual planning was suggested by her repeated inaccurate placement on a page of designs she copied despite having been given verbal directions coupled with her tendency to copy designs much larger than portrayed thereby being unable to fit them within the space allotted.  She was judged to have put forth optimal effort.    The Brief Neuropsychological Cognitive Examination (BNCE) offers a standardized screening of major cognitive functions typically affected by neurological and psychiatric conditions. Her BNCE total score of 19/30 fell within the moderate range of impairment. According to the test manual, persons who score within this range are usually unable to live independently. Her subtest scores are listed below:  Subtest Range of Impairment  Orientation Mild  Presidential Memory None  Naming None  Comprehension None  Constructive Praxis Moderate  Shifting Set Severe  Incomplete Pictures Moderate  Similarities Mild  Attention Moderate  Working Memory  None   As  shown above, her relative weaknesses on the BNCE were evident on tasks that required concentration, mental tracking or visual sequencing (Attention), shifting or changing from an established pattern of responding (Shifting Set), processing and organizing incomplete visual stimuli (Incomplete Pictures) and copying simple geometric figures and line drawings (Constructive Praxis). Mild problems were evident for orientation as she could state only the current year and did not know her current place (Orientation) and for abstract verbal reasoning (Similarities). She was successful in expressing general information (Presidential Memory), naming body parts (Naming), following simple spoken commands (Comprehension) and recalling three words after a brief delay (Working Memory)  Her attention and auditory memory were further assessed.   His passive span of auditory attention, as measured by her repetition of digit sequences (WAIS-IV Digit Span Forward: 16th percentile), fell within the Low Average range. Her performances on tests that required working memory in order to hold and manipulate digit sequences in short-term auditory memory (Digit Span Backward: 9th percentile & Sequencing: 2nd percentile) varied from the Low Average to Mildly Impaired ranges. Her performance on a visual working memory task that required immediate recall of series of symbols in right to left order (Wechsler Memory Scale-IV (WMS-IV) Symbol Span: 2nd percentile) fell within the Mildly Impaired range.   A measure of her immediate memory for stories (WMS-IV Logical Memory I: 25th percentile) fell at the lower boundary of the Average range. What was most notable about her immediate memory was her tendency to confabulate the meanings of the stories (e.g., she recalled a story about a woman who was robbed as the woman having robbed others and being jailed). A measure of her delayed story memory (WMS-IV Logical Memory II: 25th percentile) fell  within the Average range. She showed normal savings (i.e., 80%) of information initially learned. In contrast, her delayed recognition memory, as assessed by her answers to yes/no questions about the stories, fell within the Borderline impaired range. This result suggested that offering her both and tends to confuse her.   On the Patient Health Questionnaire, which is a screening inventory for depression, her score of 1 indicated that she did not report depression. The only symptom she endorsed was having felt bad about herself for a few days within the past two weeks.    Summary & Conclusions Tameca Jerez is a 65 year-old woman with a longtime history of cognitive impairment due to a remote traumatic brain injury (TBI). Over the past year her family members have noticed deterioration in her cognitive and gait functioning. Her daughter-in-law reported that her frequency of falling has markedly decreased since she moved into an assisted living facility in early 2017. She independently performs her personal care activities and dressing but requires assistance for most instrumental activities of daily living.  Neurocognitive testing indicated an overall moderate degree of cognitive impairment characterized by prominent deficits for attention/mental tracking, mental shifting, visual-perceptual planning (based on qualitative observation) and memory (decent acquisition but with confabulatory tendencies). 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 There were no indications of emotional or behavioral disturbance. She described herself as content in her life and without worries.   Her history of severe TBI appears to be the primary cause of her cognitive dysfunction. In addition, her past history of  alcohol abuse could have exacerbated her cognitive  difficulties. Family report of her recent gradual decline of memory is concerning for a superimposed neurodegenerative process. Her history of severe TBI puts her at higher risk to develop a primary dementia. It is recommended that she undergo serial cognitive reassessment at least on a yearly basis to track for changes, if any, in her cognitive functioning over time.   Her current placement in an assisted living facility appears ideal.  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 Tomasetti. Please feel free to contact me with any comments or questions.    ______________________ Gladstone PihMichael F. Kamillah Didonato, Ph.D Licensed Psychologist

## 2016-02-15 NOTE — Progress Notes (Signed)
I agree with the assessment and plan as directed by NP .The patient is known to me .   Shamona Wirtz, MD  

## 2016-02-16 ENCOUNTER — Telehealth: Payer: Self-pay

## 2016-02-16 NOTE — Telephone Encounter (Signed)
I called pt again to make her an appt with Dr. Vickey Hugerohmeier. No answer, went to busy tone. I will try again later.

## 2016-02-16 NOTE — Telephone Encounter (Signed)
Dr. Vickey Hugerohmeier asked me to call Shannon Barry and make her a follow up appt after reviewing Dr. Maxwell MarionZelson's neuropsych report on Shannon Barry.  I called Shannon Barry to make an appt. No answer, no voicemail set up. Will try again later.

## 2016-02-17 NOTE — Telephone Encounter (Signed)
I called pt's phone number again but no answer, no voicemail picked up. I called pt's son/daughter-in-law, Shannon Bibleat and Shannon Barry, phone [phone] per DPR. No answer, voicemail is full, unable to leave a message.  If I have not heard back from the pt or family by the end of today, I will mail pt a letter.

## 2016-02-18 NOTE — Telephone Encounter (Signed)
I have unsuccessful in reaching the pt and the pt's son Shannon Barry.  I have sent the pt a letter asking her to call us.  Pt needs an appt with Dr. Vickey Barry to discuss results of neuropsych testing.

## 2016-04-05 ENCOUNTER — Encounter: Payer: Self-pay | Admitting: Adult Health

## 2016-04-05 ENCOUNTER — Ambulatory Visit (INDEPENDENT_AMBULATORY_CARE_PROVIDER_SITE_OTHER): Payer: PRIVATE HEALTH INSURANCE | Admitting: Adult Health

## 2016-04-05 ENCOUNTER — Telehealth: Payer: Self-pay

## 2016-04-05 VITALS — BP 103/68 | HR 92 | Resp 16 | Ht 62.0 in | Wt 134.0 lb

## 2016-04-05 DIAGNOSIS — Z5181 Encounter for therapeutic drug level monitoring: Secondary | ICD-10-CM

## 2016-04-05 DIAGNOSIS — R413 Other amnesia: Secondary | ICD-10-CM

## 2016-04-05 DIAGNOSIS — Z87820 Personal history of traumatic brain injury: Secondary | ICD-10-CM | POA: Diagnosis not present

## 2016-04-05 DIAGNOSIS — R569 Unspecified convulsions: Secondary | ICD-10-CM

## 2016-04-05 NOTE — Patient Instructions (Signed)
Continue Depakote We will call nursing facility about Lamictal Blood work today  Repeat EEG In the future may consider memory medication such as Namenda  Memantine Tablets What is this medicine? MEMANTINE (MEM an teen) is used to treat dementia caused by Alzheimer's disease. This medicine may be used for other purposes; ask your health care provider or pharmacist if you have questions. What should I tell my health care provider before I take this medicine? They need to know if you have any of these conditions: -difficulty passing urine -kidney disease -liver disease -seizures -an unusual or allergic reaction to memantine, other medicines, foods, dyes, or preservatives -pregnant or trying to get pregnant -breast-feeding How should I use this medicine? Take this medicine by mouth with a glass of water. Follow the directions on the prescription label. You may take this medicine with or without food. Take your doses at regular intervals. Do not take your medicine more often than directed. Continue to take your medicine even if you feel better. Do not stop taking except on the advice of your doctor or health care professional. Talk to your pediatrician regarding the use of this medicine in children. Special care may be needed. Overdosage: If you think you have taken too much of this medicine contact a poison control center or emergency room at once. NOTE: This medicine is only for you. Do not share this medicine with others. What if I miss a dose? If you miss a dose, take it as soon as you can. If it is almost time for your next dose, take only that dose. Do not take double or extra doses. If you do not take your medicine for several days, contact your health care provider. Your dose may need to be changed. What may interact with this  medicine? -acetazolamide -amantadine -cimetidine -dextromethorphan -dofetilide -hydrochlorothiazide -ketamine -metformin -methazolamide -quinidine -ranitidine -sodium bicarbonate -triamterene This list may not describe all possible interactions. Give your health care provider a list of all the medicines, herbs, non-prescription drugs, or dietary supplements you use. Also tell them if you smoke, drink alcohol, or use illegal drugs. Some items may interact with your medicine. What should I watch for while using this medicine? Visit your doctor or health care professional for regular checks on your progress. Check with your doctor or health care professional if there is no improvement in your symptoms or if they get worse. You may get drowsy or dizzy. Do not drive, use machinery, or do anything that needs mental alertness until you know how this drug affects you. Do not stand or sit up quickly, especially if you are an older patient. This reduces the risk of dizzy or fainting spells. Alcohol can make you more drowsy and dizzy. Avoid alcoholic drinks. What side effects may I notice from receiving this medicine? Side effects that you should report to your doctor or health care professional as soon as possible: -allergic reactions like skin rash, itching or hives, swelling of the face, lips, or tongue -agitation or a feeling of restlessness -depressed mood -dizziness -hallucinations -redness, blistering, peeling or loosening of the skin, including inside the mouth -seizures -vomiting Side effects that usually do not require medical attention (report to your doctor or health care professional if they continue or are bothersome): -constipation -diarrhea -headache -nausea -trouble sleeping This list may not describe all possible side effects. Call your doctor for medical advice about side effects. You may report side effects to FDA at 1-800-FDA-1088. Where should I keep my medicine? Keep  out of the reach of children. Store at room temperature between 15 degrees and 30 degrees C (59 degrees and 86 degrees F). Throw away any unused medicine after the expiration date. NOTE: This sheet is a summary. It may not cover all possible information. If you have questions about this medicine, talk to your doctor, pharmacist, or health care provider.    2016, Elsevier/Gold Standard. (2013-03-04 14:10:42)

## 2016-04-05 NOTE — Telephone Encounter (Signed)
I called to speak to the nurse over this patient at [facility]. I had to LM for call back. I need to find out if patient is still taking Lamictal? If not, what was the reason for discontinuation?
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
## 2016-04-05 NOTE — Progress Notes (Signed)
I agree with the assessment and plan as directed by NP .The patient is known to me .   Mujtaba Bollig, MD  

## 2016-04-06 ENCOUNTER — Telehealth: Payer: Self-pay

## 2016-04-06 LAB — COMPREHENSIVE METABOLIC PANEL
ALBUMIN: 3.9 g/dL (ref 3.6–4.8)
ALK PHOS: 81 IU/L (ref 39–117)
ALT: 11 IU/L (ref 0–32)
AST: 20 IU/L (ref 0–40)
Albumin/Globulin Ratio: 1.4 (ref 1.2–2.2)
BUN / CREAT RATIO: 11 — AB (ref 12–28)
BUN: 8 mg/dL (ref 8–27)
Bilirubin Total: <0.2 mg/dL (ref 0.0–1.2)
CALCIUM: 10.1 mg/dL (ref 8.7–10.3)
CO2: 25 mmol/L (ref 18–29)
CREATININE: 0.74 mg/dL (ref 0.57–1.00)
Chloride: 98 mmol/L (ref 96–106)
GFR calc Af Amer: 100 mL/min/{1.73_m2} (ref >59)
GFR, EST NON AFRICAN AMERICAN: 86 mL/min/{1.73_m2} (ref >59)
GLOBULIN, TOTAL: 2.8 g/dL (ref 1.5–4.5)
GLUCOSE: 93 mg/dL (ref 65–99)
Potassium: 4.3 mmol/L (ref 3.5–5.2)
SODIUM: 139 mmol/L (ref 134–144)
TOTAL PROTEIN: 6.7 g/dL (ref 6.0–8.5)

## 2016-04-06 LAB — CBC WITH DIFFERENTIAL/PLATELET
BASOS: 0 %
Basophils Absolute: 0 10*3/uL (ref 0.0–0.2)
EOS (ABSOLUTE): 0 10*3/uL (ref 0.0–0.4)
EOS: 0 %
HEMATOCRIT: 40.6 % (ref 34.0–46.6)
HEMOGLOBIN: 13.8 g/dL (ref 11.1–15.9)
IMMATURE GRANULOCYTES: 0 %
Immature Grans (Abs): 0 10*3/uL (ref 0.0–0.1)
LYMPHS ABS: 1.7 10*3/uL (ref 0.7–3.1)
Lymphs: 30 %
MCH: 31.1 pg (ref 26.6–33.0)
MCHC: 34 g/dL (ref 31.5–35.7)
MCV: 91 fL (ref 79–97)
MONOCYTES: 12 %
Monocytes Absolute: 0.7 10*3/uL (ref 0.1–0.9)
Neutrophils Absolute: 3.2 10*3/uL (ref 1.4–7.0)
Neutrophils: 58 %
Platelets: 233 10*3/uL (ref 150–379)
RBC: 4.44 x10E6/uL (ref 3.77–5.28)
RDW: 13.3 % (ref 12.3–15.4)
WBC: 5.6 10*3/uL (ref 3.4–10.8)

## 2016-04-06 LAB — VALPROIC ACID LEVEL: Valproic Acid Lvl: 66 ug/mL (ref 50–100)

## 2016-04-06 NOTE — Telephone Encounter (Signed)
Patient has not had any seizures. Lab work is ok on Depakote. For now she will remain on Depakote. If she has any seizure events we may need to restart Lamictal.   In the office visit the patient reported that her medication was adjusted because she was having gait difficulty perhaps this was due to Lamictal? Patient wasn't sure.

## 2016-04-06 NOTE — Telephone Encounter (Signed)
-----   Message from Butch PennyMegan Millikan, NP sent at 04/06/2016  3:28 PM EST ----- Lab work unremarkable. Please call patient.

## 2016-04-06 NOTE — Telephone Encounter (Signed)
No answer and no vm

## 2016-04-06 NOTE — Telephone Encounter (Signed)
Priscilla/ [facility] returned call, she states pt is not taking Lamictal and "has no earthly idea why she is not taking it. "  [phone]

## 2016-04-07 NOTE — Telephone Encounter (Signed)
I called again, no answer and no vm.

## 2016-04-11 NOTE — Telephone Encounter (Signed)
I spoke to pt and advised her that her lab work was unremarkable, per [name], NP. Pt verbalized understanding of results. I reminded pt of EEG appt on 04/14/16 at 9:30am. Pt verbalized understanding. Pt had no questions at this time but was encouraged to call back if questions arise.

## 2016-04-14 ENCOUNTER — Ambulatory Visit (INDEPENDENT_AMBULATORY_CARE_PROVIDER_SITE_OTHER): Payer: PRIVATE HEALTH INSURANCE | Admitting: Neurology

## 2016-04-14 DIAGNOSIS — R569 Unspecified convulsions: Secondary | ICD-10-CM

## 2016-04-15 NOTE — Procedures (Signed)
   [facility]  EEG (ELECTROENCEPHALOGRAM) REPORT   STUDY DATE: 04/14/16 PATIENT NAME: Shannon Barry DOB: 1950/12/25 MRN: [id number]  ORDERING CLINICIAN: Butch PennyMegan Millikan, NP / Melvyn Novasarmen Dohmeier, MD   TECHNOLOGIST: Gearldine ShownLorraine Jones  TECHNIQUE: Electroencephalogram was recorded utilizing standard 10-20 system of lead placement and reformatted into average and bipolar montages.  RECORDING TIME: 25 minutes  ACTIVATION: hyperventilation and photic stimulation  CLINICAL INFORMATION: 65 year old female with traumatic brain injury, memory loss and seizures.  FINDINGS: Background rhythms of 9-10 hertz and 20-50 microvolts. At times the amplitudes are higher amplitude on the right than compared to the left side, and this may reflect partial breach rhythm from prior right sided burr holes. No epileptiform activity or seizures are seen. Patient recorded in the awake and drowsy state. EKG channel shows regular rhythm of 75-80 beats per minute.  IMPRESSION:  Unremarkable EEG in the awake and drowsy states. No epileptiform discharges or seizures noted. Partial breach rhythm noted over the right side may be related to prior right sided burr holes.    INTERPRETING PHYSICIAN:  Suanne MarkerVIKRAM R. Thelia Tanksley, MD Certified in Neurology, Neurophysiology and Neuroimaging  [facility] [address], [address] [place], [place] [address] [phone]

## 2016-04-18 ENCOUNTER — Telehealth: Payer: Self-pay | Admitting: *Deleted

## 2016-04-18 NOTE — Telephone Encounter (Signed)
Per Dolores HooseM Millikan, NP spoke with patient and informed her that her EEG results are unremarkable. She verbalized understanding, appreciation.

## 2016-05-02 ENCOUNTER — Telehealth: Payer: Self-pay | Admitting: Neurology

## 2016-05-05 NOTE — Telephone Encounter (Signed)
Erroneous

## 2016-07-26 ENCOUNTER — Other Ambulatory Visit: Payer: Self-pay | Admitting: Geriatric Medicine

## 2016-07-26 DIAGNOSIS — Z1231 Encounter for screening mammogram for malignant neoplasm of breast: Secondary | ICD-10-CM

## 2016-08-03 ENCOUNTER — Ambulatory Visit (INDEPENDENT_AMBULATORY_CARE_PROVIDER_SITE_OTHER): Payer: PRIVATE HEALTH INSURANCE | Admitting: Neurology

## 2016-08-03 ENCOUNTER — Encounter: Payer: Self-pay | Admitting: Neurology

## 2016-08-03 VITALS — BP 132/77 | HR 85 | Resp 20 | Ht 63.0 in | Wt 136.0 lb

## 2016-08-03 DIAGNOSIS — R561 Post traumatic seizures: Secondary | ICD-10-CM

## 2016-08-03 DIAGNOSIS — G308 Other Alzheimer's disease: Secondary | ICD-10-CM

## 2016-08-03 DIAGNOSIS — Z87898 Personal history of other specified conditions: Secondary | ICD-10-CM | POA: Diagnosis not present

## 2016-08-03 DIAGNOSIS — F1011 Alcohol abuse, in remission: Secondary | ICD-10-CM

## 2016-08-03 DIAGNOSIS — F0280 Dementia in other diseases classified elsewhere without behavioral disturbance: Secondary | ICD-10-CM

## 2016-08-03 DIAGNOSIS — F0781 Postconcussional syndrome: Secondary | ICD-10-CM

## 2016-08-03 DIAGNOSIS — F428 Other obsessive-compulsive disorder: Secondary | ICD-10-CM

## 2016-08-03 MED ORDER — LAMOTRIGINE 25 MG PO TABS: ORAL_TABLET | ORAL | 5 refills | Status: DC

## 2016-08-03 MED ORDER — DIVALPROEX SODIUM 500 MG PO DR TAB: 500.0000 mg | DELAYED_RELEASE_TABLET | Freq: Two times a day (BID) | ORAL | 3 refills | Status: DC
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
## 2016-08-03 NOTE — Patient Instructions (Signed)
Lamotrigine tablets What is this medicine? LAMOTRIGINE (la MOE tri jeen) is used to control seizures in adults and children with epilepsy and Lennox-Gastaut syndrome. It is also used in adults to treat bipolar disorder. This medicine may be used for other purposes; ask your health care provider or pharmacist if you have questions. COMMON BRAND NAME(S): Lamictal What should I tell my health care provider before I take this medicine? They need to know if you have any of these conditions: -a history of depression or bipolar disorder -aseptic meningitis during prior use of lamotrigine -folate deficiency -kidney disease -liver disease -suicidal thoughts, plans, or attempt; a previous suicide attempt by you or a family member -an unusual or allergic reaction to lamotrigine or other seizure medications, other medicines, foods, dyes, or preservatives -pregnant or trying to get pregnant -breast-feeding How should I use this medicine? Take this medicine by mouth with a glass of water. Follow the directions on the prescription label. Do not chew these tablets. If this medicine upsets your stomach, take it with food or milk. Take your doses at regular intervals. Do not take your medicine more often than directed. A special MedGuide will be given to you by the pharmacist with each new prescription and refill. Be sure to read this information carefully each time. Talk to your pediatrician regarding the use of this medicine in children. While this drug may be prescribed for children as young as 2 years for selected conditions, precautions do apply. Overdosage: If you think you have taken too much of this medicine contact a poison control center or emergency room at once. NOTE: This medicine is only for you. Do not share this medicine with others. What if I miss a dose? If you miss a dose, take it as soon as you can. If it is almost time for your next dose, take only that dose. Do not take double or extra  doses. What may interact with this medicine? -carbamazepine -female hormones, including contraceptive or birth control pills -methotrexate -phenobarbital -phenytoin -primidone -pyrimethamine -rifampin -trimethoprim -valproic acid This list may not describe all possible interactions. Give your health care provider a list of all the medicines, herbs, non-prescription drugs, or dietary supplements you use. Also tell them if you smoke, drink alcohol, or use illegal drugs. Some items may interact with your medicine. What should I watch for while using this medicine? Visit your doctor or health care professional for regular checks on your progress. If you take this medicine for seizures, wear a Medic Alert bracelet or necklace. Carry an identification card with information about your condition, medicines, and doctor or health care professional. It is important to take this medicine exactly as directed. When first starting treatment, your dose will need to be adjusted slowly. It may take weeks or months before your dose is stable. You should contact your doctor or health care professional if your seizures get worse or if you have any new types of seizures. Do not stop taking this medicine unless instructed by your doctor or health care professional. Stopping your medicine suddenly can increase your seizures or their severity. Contact your doctor or health care professional right away if you develop a rash while taking this medicine. Rashes may be very severe and sometimes require treatment in the hospital. Deaths from rashes have occurred. Serious rashes occur more often in children than adults taking this medicine. It is more common for these serious rashes to occur during the first 2 months of treatment, but a rash can   occur at any time. You may get drowsy, dizzy, or have blurred vision. Do not drive, use machinery, or do anything that needs mental alertness until you know how this medicine affects you.  To reduce dizzy or fainting spells, do not sit or stand up quickly, especially if you are an older patient. Alcohol can increase drowsiness and dizziness. Avoid alcoholic drinks. If you are taking this medicine for bipolar disorder, it is important to report any changes in your mood to your doctor or health care professional. If your condition gets worse, you get mentally depressed, feel very hyperactive or manic, have difficulty sleeping, or have thoughts of hurting yourself or committing suicide, you need to get help from your health care professional right away. If you are a caregiver for someone taking this medicine for bipolar disorder, you should also report these behavioral changes right away. The use of this medicine may increase the chance of suicidal thoughts or actions. Pay special attention to how you are responding while on this medicine. Your mouth may get dry. Chewing sugarless gum or sucking hard candy, and drinking plenty of water may help. Contact your doctor if the problem does not go away or is severe. Women who become pregnant while using this medicine may enroll in the [place] American Antiepileptic Drug Pregnancy Registry by calling 1-888-233-2334. This registry collects information about the safety of antiepileptic drug use during pregnancy. What side effects may I notice from receiving this medicine? Side effects that you should report to your doctor or health care professional as soon as possible: -allergic reactions like skin rash, itching or hives, swelling of the face, lips, or tongue -blurred or double vision -difficulty walking or controlling muscle movements -fever -headache, stiff neck, and sensitivity to light -painful sores in the mouth, eyes, or nose -redness, blistering, peeling or loosening of the skin, including inside the mouth -severe muscle pain -swollen lymph glands -uncontrollable eye movements -unusual bruising or bleeding -unusually weak or  tired -vomiting -worsening of mood, thoughts or actions of suicide or dying -yellowing of the eyes or skin Side effects that usually do not require medical attention (report to your doctor or health care professional if they continue or are bothersome): -diarrhea or constipation -difficulty sleeping -nausea -tremors This list may not describe all possible side effects. Call your doctor for medical advice about side effects. You may report side effects to FDA at 1-800-FDA-1088. Where should I keep my medicine? Keep out of reach of children. Store at room temperature between 15 and 30 degrees C (59 and 86 degrees F). Throw away any unused medicine after the expiration date. NOTE: This sheet is a summary. It may not cover all possible information. If you have questions about this medicine, talk to your doctor, pharmacist, or health care provider.  2018 Elsevier/Gold Standard (2015-06-18 09:29:40)  

## 2016-08-16 ENCOUNTER — Ambulatory Visit
Admission: RE | Admit: 2016-08-16 | Discharge: 2016-08-16 | Disposition: A | Discharge status: Home / Self Care | Payer: PRIVATE HEALTH INSURANCE | Source: Ambulatory Visit | Attending: Geriatric Medicine | Admitting: Geriatric Medicine

## 2016-08-16 DIAGNOSIS — Z1231 Encounter for screening mammogram for malignant neoplasm of breast: Secondary | ICD-10-CM

## 2016-08-24 ENCOUNTER — Telehealth: Payer: Self-pay

## 2016-08-24 NOTE — Telephone Encounter (Signed)
Received [facility] from [facility] that pt's serum ammonia level was 73 and pt fell. Dr. Redmond Schoolripp changed pt's medications to lamotrigine XR 100mg  po QHS and depakote ER 375 mg po BID.

## 2016-08-24 NOTE — Telephone Encounter (Signed)
Agree with reduction in Depakote given the liver toxicity of this medication. Increase Lamictal. CD

## 2016-11-10 ENCOUNTER — Ambulatory Visit (INDEPENDENT_AMBULATORY_CARE_PROVIDER_SITE_OTHER): Payer: PRIVATE HEALTH INSURANCE | Admitting: Adult Health

## 2016-11-10 ENCOUNTER — Other Ambulatory Visit: Payer: Self-pay

## 2016-11-10 ENCOUNTER — Encounter: Payer: Self-pay | Admitting: Adult Health

## 2016-11-10 VITALS — BP 114/69 | HR 75 | Ht 63.0 in | Wt 136.8 lb

## 2016-11-10 DIAGNOSIS — R569 Unspecified convulsions: Secondary | ICD-10-CM | POA: Diagnosis not present

## 2016-11-10 MED ORDER — DIVALPROEX SODIUM ER 500 MG PO TB24: 500.0000 mg | ORAL_TABLET | Freq: Every day | ORAL | 5 refills | Status: DC

## 2016-11-10 NOTE — Progress Notes (Signed)
PATIENT: Shannon PortelaLaura Barry DOB: [birth year]  REASON FOR VISIT: follow up- seizures HISTORY FROM: patient  HISTORY OF PRESENT ILLNESS: [name] is a 66 year old female with a history of seizures. She returns today for follow-up. She has no family here with her today. We did call her son who manages her medications. He states that she is taking Depakote 750 mg daily extended release and lamotrigine extended release 100 mg daily. The patient denies any seizure events. [facility] lives at [facility]. She is able to complete all ADLs independently. She does not operate a motor vehicle. Denies any changes with her gait or balance. No change in mood or behavior. She returns today for an evaluation.   Interval history from 08/03/2016, Shannon Barry lead reports that she has had no recent seizures, but vivid dreams that she sometimes would mis take for seizures.  She also states that her fall risk was closely associated to nocturnal bathroom breaks. She had fallen on the way to the bathroom. After she reduced her caffeine - beverage intake, she has much less of these bathroom breaks and therefore had no falls recently.  She was able to walk steadily without any assistive device and has taken her granddaughter to the mall for her 2oth birthday, " having a blast ". She remains sober, she has no longer problems with eye movements. She is steady in her gait, and handmovements, no tremor noted.   REVIEW OF SYSTEMS: Out of a complete 14 system review of symptoms, the patient complains only of the following symptoms, and all other reviewed systems are negative.  Murmur  ALLERGIES: Allergies  Allergen Reactions  . Penicillins Hives    Has patient had a PCN reaction causing immediate rash, facial/tongue/throat swelling, SOB or lightheadedness with hypotension: Has patient had a PCN reaction causing severe rash involving mucus membranes or skin necrosis: Yes Has patient had a PCN reaction that required  hospitalization No Has patient had a PCN reaction occurring within the last 10 years: Yes If all of the above answers are "NO", then may proceed with Cephalosporin use.   . Sulfa Antibiotics Other (See Comments)    violently ill    HOME MEDICATIONS: Outpatient Medications Prior to Visit  Medication Sig Dispense Refill  . Calcium Carbonate-Vitamin D (CALCIUM-VITAMIN D) 600-125 MG-UNIT TABS Take 1 tablet by mouth daily.    Marland Kitchen. desvenlafaxine (PRISTIQ) 100 MG 24 hr tablet Take 50 mg by mouth daily.     . divalproex (DEPAKOTE ER) 250 MG 24 hr tablet Take 275 mg by mouth 2 (two) times daily.    . divalproex (DEPAKOTE) 500 MG DR tablet Take 1 tablet (500 mg total) by mouth 2 (two) times daily. 180 tablet 3  . docusate sodium (COLACE) 100 MG capsule Take 100 mg by mouth 2 (two) times daily.    Marland Kitchen. ibuprofen (ADVIL,MOTRIN) 200 MG tablet Take 200 mg by mouth 2 (two) times daily.     Marland Kitchen. lamoTRIgine (LAMICTAL) 25 MG tablet Take 25 mg bid po for 14 days, than 50 mg bid for th remainder of the month. 120 tablet 5  . levothyroxine (SYNTHROID, LEVOTHROID) 100 MCG tablet Take 100 mcg by mouth daily before breakfast.    . meclizine (ANTIVERT) 12.5 MG tablet Take 12.5 mg by mouth 3 (three) times daily as needed for dizziness.    . LamoTRIgine 100 MG TB24 Take 100 mg by mouth at bedtime.     No facility-administered medications prior to visit.     PAST  MEDICAL HISTORY: Past Medical History:  Diagnosis Date  . Allergy   . Depression   . Seizures (HCC)   . Thyroid disease   . Traumatic brain injury (HCC)     PAST SURGICAL HISTORY: History reviewed. No pertinent surgical history.  FAMILY HISTORY: History reviewed. No pertinent family history.  SOCIAL HISTORY: Social History   Social History  . Marital status: Married    Spouse name: N/A  . Number of children: N/A  . Years of education: N/A   Occupational History  . Not on file.   Social History Main Topics  . Smoking status: Never Smoker    . Smokeless tobacco: Never Used  . Alcohol use No  . Drug use: No  . Sexual activity: Not on file   Other Topics Concern  . Not on file   Social History Narrative  . No narrative on file      PHYSICAL EXAM  Vitals:   11/10/16 1430  BP: 114/69  Pulse: 75  Weight: 136 lb 12.8 oz (62.1 kg)  Height: 5\' 3"  (1.6 m)   Body mass index is 24.23 kg/m.  Generalized: Well developed, in no acute distress   Neurological examination  Mentation: Alert oriented to time, place, history taking. Follows all commands speech and language fluent Cranial nerve II-XII: Pupils were equal round reactive to light. Extraocular movements were full, visual field were full on confrontational test. Facial sensation and strength were normal. Uvula tongue midline. Head turning and shoulder shrug  were normal and symmetric. Motor: The motor testing reveals 5 over 5 strength of all 4 extremities. Good symmetric motor tone is noted throughout.  Sensory: Sensory testing is intact to soft touch on all 4 extremities. No evidence of extinction is noted.  Coordination: Cerebellar testing reveals good finger-nose-finger and heel-to-shin bilaterally.  Gait and station: Gait is normal. Tandem gait not attempted. Romberg is negative  Reflexes: Deep tendon reflexes are symmetric and normal bilaterally.   DIAGNOSTIC DATA (LABS, IMAGING, TESTING) - I reviewed patient records, labs, notes, testing and imaging myself where available.  Lab Results  Component Value Date   WBC 5.6 04/05/2016   HGB 13.8 04/05/2016   HCT 40.6 04/05/2016   MCV 91 04/05/2016   PLT 233 04/05/2016      Component Value Date/Time   NA 139 04/05/2016 1422   K 4.3 04/05/2016 1422   CL 98 04/05/2016 1422   CO2 25 04/05/2016 1422   GLUCOSE 93 04/05/2016 1422   GLUCOSE 94 12/28/2015 1210   BUN 8 04/05/2016 1422   CREATININE 0.74 04/05/2016 1422   CALCIUM 10.1 04/05/2016 1422   PROT 6.7 04/05/2016 1422   ALBUMIN 3.9 04/05/2016 1422   AST  20 04/05/2016 1422   ALT 11 04/05/2016 1422   ALKPHOS 81 04/05/2016 1422   BILITOT <0.2 04/05/2016 1422   GFRNONAA 86 04/05/2016 1422   GFRAA 100 04/05/2016 1422   No results found for: CHOL, HDL, LDLCALC, LDLDIRECT, TRIG, CHOLHDL No results found for: ZOXW9U No results found for: VITAMINB12 Lab Results  Component Value Date   TSH 8.153 (H) 07/12/2015      ASSESSMENT AND PLAN 66 y.o. year old female  has a past medical history of Allergy; Depression; Seizures (HCC); Thyroid disease; and Traumatic brain injury (HCC). here with :  1. Seizures  Overall the patient is doing well. She will continue on Lamictal extended release 500 mg daily. Dr. Vickey Huger would like her to wean off of Depakote. We will decrease Depakote  ER to 500 mg daily. We will check blood work today. She is advised if she has any additional seizure event she should let us know. She will follow-up in 6 months with Dr. Vickey Huger  I spent 15 minutes with the patient. 50% of this time was spent Discussing her medication adjustment.      Butch Penny, MSN, NP-C 11/10/2016, 2:42 PM [facility] [address] [place], [place] [address] [phone]

## 2016-11-10 NOTE — Progress Notes (Signed)
I have read the note, and I agree with the clinical assessment and plan.  WILLIS,CHARLES KEITH   

## 2016-11-10 NOTE — Telephone Encounter (Signed)
[facility] ER 100 mg tablet. Dr. Vickey Hugerohmeier has not prescribed this for her; the last RX from Dr. Vickey Hugerohmeier was lamictal 50 mg BID. We [facility] a [facility] that the MD changed pt's lamictal to lamcital XR 100 mg qhs. Dr. Vickey Hugerohmeier said that she agreed with this medication increase but did not place an order.  I [facility] but they report to me that pt is no longer a resident there.  I called pt to discuss, no answer, no VM set up. Will try again later.

## 2016-11-10 NOTE — Patient Instructions (Addendum)
Continue Lamictal extended release 100 mg daily  Decrease Depakote ER 500 mg daily.  Blood work today If your symptoms worsen or you develop new symptoms please let us know.

## 2016-11-11 LAB — COMPREHENSIVE METABOLIC PANEL
ALK PHOS: 63 IU/L (ref 39–117)
ALT: 12 IU/L (ref 0–32)
AST: 20 IU/L (ref 0–40)
Albumin/Globulin Ratio: 1.6 (ref 1.2–2.2)
Albumin: 3.9 g/dL (ref 3.6–4.8)
BUN/Creatinine Ratio: 17 (ref 12–28)
BUN: 8 mg/dL (ref 8–27)
Bilirubin Total: <0.2 mg/dL (ref 0.0–1.2)
CALCIUM: 9.5 mg/dL (ref 8.7–10.3)
CO2: 20 mmol/L (ref 20–29)
CREATININE: 0.48 mg/dL — AB (ref 0.57–1.00)
Chloride: 98 mmol/L (ref 96–106)
GFR calc Af Amer: 120 mL/min/{1.73_m2} (ref >59)
GFR, EST NON AFRICAN AMERICAN: 104 mL/min/{1.73_m2} (ref >59)
Globulin, Total: 2.4 g/dL (ref 1.5–4.5)
Glucose: 90 mg/dL (ref 65–99)
POTASSIUM: 4.8 mmol/L (ref 3.5–5.2)
Sodium: 136 mmol/L (ref 134–144)
Total Protein: 6.3 g/dL (ref 6.0–8.5)

## 2016-11-14 MED ORDER — LAMOTRIGINE ER 100 MG PO TB24: 100.0000 mg | ORAL_TABLET | Freq: Every day | ORAL | 5 refills | Status: DC

## 2016-11-14 NOTE — Telephone Encounter (Signed)
[name], NP saw pt on 11/10/2016. I spoke with her and she is agreeable to refilling the lamictal 100mg  ER 100mg  qhs.

## 2016-11-14 NOTE — Telephone Encounter (Signed)
T[facility] [phone] called req medication. msg relayed, he will reach out to the patient.

## 2016-11-21 NOTE — Progress Notes (Signed)
I agree with the assessment and plan as directed by NP .The patient is known to me .   Tyechia Allmendinger, MD  

## 2017-04-18 ENCOUNTER — Emergency Department (HOSPITAL_COMMUNITY)
Admission: EM | Admit: 2017-04-18 | Discharge: 2017-04-18 | Disposition: A | Discharge status: Home / Self Care | Payer: PRIVATE HEALTH INSURANCE | Attending: Emergency Medicine | Admitting: Emergency Medicine

## 2017-04-18 ENCOUNTER — Emergency Department (HOSPITAL_COMMUNITY): Payer: PRIVATE HEALTH INSURANCE

## 2017-04-18 ENCOUNTER — Other Ambulatory Visit: Payer: Self-pay

## 2017-04-18 ENCOUNTER — Encounter (HOSPITAL_COMMUNITY): Payer: Self-pay

## 2017-04-18 DIAGNOSIS — W108XXA Fall (on) (from) other stairs and steps, initial encounter: Secondary | ICD-10-CM | POA: Insufficient documentation

## 2017-04-18 DIAGNOSIS — Y9301 Activity, walking, marching and hiking: Secondary | ICD-10-CM | POA: Insufficient documentation

## 2017-04-18 DIAGNOSIS — S0990XA Unspecified injury of head, initial encounter: Secondary | ICD-10-CM | POA: Diagnosis present

## 2017-04-18 DIAGNOSIS — Y9210 Unspecified residential institution as the place of occurrence of the external cause: Secondary | ICD-10-CM | POA: Insufficient documentation

## 2017-04-18 DIAGNOSIS — Y999 Unspecified external cause status: Secondary | ICD-10-CM | POA: Insufficient documentation

## 2017-04-18 DIAGNOSIS — Z79899 Other long term (current) drug therapy: Secondary | ICD-10-CM | POA: Diagnosis not present

## 2017-04-18 DIAGNOSIS — W19XXXA Unspecified fall, initial encounter: Secondary | ICD-10-CM

## 2017-04-18 DIAGNOSIS — S0003XA Contusion of scalp, initial encounter: Secondary | ICD-10-CM | POA: Insufficient documentation

## 2017-04-18 NOTE — ED Triage Notes (Addendum)
[facility] BIB [facility] She had a mechanical fall today when she tripped down 3-4 steps and landed on the posterior part of her head. Hematoma noted. Denies neck or back pain, no neuro deficits, no LOC. No blood thinners. Hx of TBI. A&Ox4. She is complaining of dizziness.

## 2017-04-18 NOTE — ED Notes (Addendum)
Pt ambulated to bathroom with assist.

## 2017-04-18 NOTE — ED Provider Notes (Signed)
[facility]-EMERGENCY DEPT Provider Note   CSN: [id number] Arrival date & time: 04/18/17  1316     History   Chief Complaint Chief Complaint  Patient presents with  . Fall    HPI Shannon Barry is a 66 y.o. female.  The history is provided by the patient. No language interpreter was used.  Fall    Shannon Barry is a 66 y.o. female who presents to the Emergency Department complaining of fall.  She presents for evaluation by EMS following a fall.  She is a resident of an apartment facility and she was walking down the steps for dinner about noon when she looked up as she was walking and lost her balance and fell, striking the back of her head.  She fell down 2-3 steps.  She reports pain to the back of her head but no loss of consciousness.  She does have some slight dizziness with movement of her head.  No nausea, vomiting, neck pain, chest pain, abdominal pain, back pain, numbness, weakness.  She denies any recent illnesses.  She does not take any blood thinners.  Symptoms are mild, constant. Past Medical History:  Diagnosis Date  . Allergy   . Depression   . Seizures (HCC)   . Thyroid disease   . Traumatic brain injury [facility](HCC)     Patient Active Problem List   Diagnosis Date Noted  . 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 History of alcohol abuse 11/10/2015  . Alzheimer's disease 11/10/2015  . 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 Alcohol abuse 09/24/2015  . OCD (obsessive compulsive disorder) 09/24/2015  . Hyponatremia 07/11/2015  . Chest pain 07/11/2015  . Nausea and vomiting 07/11/2015  . Seizures (HCC) 07/11/2015  . Vertigo   . 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 TBI (traumatic brain injury) (HCC) 05/04/2015  . 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 Dementia with behavioral disturbance 03/18/2015  . 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 No pertinent surgical history.  OB History    No data available       Home Medications    Prior to Admission medications   Medication Sig Start Date End Date Taking? Authorizing Provider  Calcium Carbonate-Vitamin D (CALCIUM-VITAMIN D) 600-125 MG-UNIT TABS Take 1 tablet by mouth daily.    [provider]  desvenlafaxine (PRISTIQ) 100 MG 24 hr tablet Take 50 mg by mouth daily.     [provider]  divalproex (DEPAKOTE ER) 500 MG 24 hr tablet Take 1 tablet (500 mg total) by mouth daily. 11/10/16   Butch PennyMillikan, Megan, NP  docusate sodium (COLACE) 100 MG capsule Take 100 mg by mouth 2 (two) times daily.    [provider]  ibuprofen (ADVIL,MOTRIN) 200 MG tablet Take 200 mg by mouth 2 (two) times daily.     [provider]  lamoTRIgine (LAMICTAL) 25 MG tablet Take 25 mg bid po for 14 days, than 50 mg bid for th remainder of the month. 08/03/16   Dohmeier, Porfirio Mylararmen, MD  LamoTRIgine 100 MG TB24 Take 100 mg by mouth at bedtime.    [provider]  LamoTRIgine 100 MG TB24 Take 1 tablet (100 mg total) by mouth at bedtime. 11/14/16   Butch PennyMillikan, Megan, NP  levothyroxine (SYNTHROID, LEVOTHROID) 100 MCG tablet Take 100 mcg by mouth daily before breakfast.    [provider]  meclizine (ANTIVERT) 12.5 MG tablet  Take 12.5 mg by mouth 3 (three) times daily as needed for dizziness.    [provider]    Family History History reviewed. No pertinent family history.  Social History Social History   Tobacco Use  . Smoking status: Never Smoker  . Smokeless tobacco: Never Used  Substance Use Topics  . Alcohol use: No    Alcohol/week: 0.0 oz  . Drug use: No     Allergies   Penicillins and Sulfa antibiotics   Review of Systems Review of Systems  All other systems reviewed and are negative.    Physical Exam Updated Vital Signs BP (!) 152/85   Pulse 75   Temp 98.4 F (36.9 C) (Oral)   Resp 16    Ht 5\' 3"  (1.6 m)   Wt 59 kg (130 lb)   SpO2 97%   BMI 23.03 kg/m   Physical Exam  Constitutional: She is oriented to person, place, and time. She appears well-developed and well-nourished.  HENT:  Head: Normocephalic.  Swelling and tenderness to the right occipital scalp  Cardiovascular: Normal rate and regular rhythm.  No murmur heard. Pulmonary/Chest: Effort normal and breath sounds normal. No respiratory distress.  Abdominal: Soft. There is no tenderness. There is no rebound and no guarding.  Musculoskeletal: She exhibits no edema or tenderness.  No C, T, L-spine tenderness  Neurological: She is alert and oriented to person, place, and time.  5 out of 5 strength in all 4 extremities with sensation to light touch intact in all 4 extremities  Skin: Skin is warm and dry.  Psychiatric: She has a normal mood and affect. Her behavior is normal.  Nursing note and vitals reviewed.    ED Treatments / Results  Labs (all labs ordered are listed, but only abnormal results are displayed) Labs Reviewed - No data to display  EKG  EKG Interpretation None       Radiology Ct Head Wo Contrast  Result Date: 04/18/2017 CLINICAL DATA:  Recent fall with posterior hematoma EXAM: CT HEAD WITHOUT CONTRAST CT CERVICAL SPINE WITHOUT CONTRAST TECHNIQUE: Multidetector CT imaging of the head and cervical spine was performed following the standard protocol without intravenous contrast. Multiplanar CT image reconstructions of the cervical spine were also generated. COMPARISON:  10/25/2015 FINDINGS: CT HEAD FINDINGS Brain: There are changes of mild atrophy. Mild chronic white matter ischemic change is seen as well. No findings to suggest acute hemorrhage, acute infarction or space-occupying mass lesion are noted. An area of encephalomalacia is noted in the right posterior parietal lobe stable from the prior exam Vascular: No hyperdense vessel or unexpected calcification. Skull: Burr holes are noted on  the right consistent with prior evacuation. No acute bony abnormality is seen. Sinuses/Orbits: No acute finding. Other: Mild scalp hematoma is noted posteriorly near the vertex. CT CERVICAL SPINE FINDINGS Alignment: Within normal limits. Skull base and vertebrae: 7 cervical segments are well visualized. Vertebral body height is well maintained. No anterolisthesis is seen. No acute fracture or acute facet abnormality is noted. The odontoid is within normal limits. Mild facet hypertrophic changes are noted. Soft tissues and spinal canal: No acute soft tissue abnormality is seen. Upper chest: Within normal limits. Other: None IMPRESSION: CT of the head: Chronic changes similar to that seen on the prior exam. New scalp hematoma posteriorly. CT of the cervical spine: Mild degenerative change without acute abnormality. Electronically Signed   By: Alcide CleverMark  Lukens M.D.   On: 04/18/2017 16:53   Ct Cervical Spine Wo Contrast  Result Date: 04/18/2017 CLINICAL DATA:  Recent fall with posterior hematoma EXAM: CT HEAD WITHOUT CONTRAST CT CERVICAL SPINE WITHOUT CONTRAST TECHNIQUE: Multidetector CT imaging of the head and cervical spine was performed following the standard protocol without intravenous contrast. Multiplanar CT image reconstructions of the cervical spine were also generated. COMPARISON:  10/25/2015 FINDINGS: CT HEAD FINDINGS Brain: There are changes of mild atrophy. Mild chronic white matter ischemic change is seen as well. No findings to suggest acute hemorrhage, acute infarction or space-occupying mass lesion are noted. An area of encephalomalacia is noted in the right posterior parietal lobe stable from the prior exam Vascular: No hyperdense vessel or unexpected calcification. Skull: Burr holes are noted on the right consistent with prior evacuation. No acute bony abnormality is seen. Sinuses/Orbits: No acute finding. Other: Mild scalp hematoma is noted posteriorly near the vertex. CT CERVICAL SPINE FINDINGS  Alignment: Within normal limits. Skull base and vertebrae: 7 cervical segments are well visualized. Vertebral body height is well maintained. No anterolisthesis is seen. No acute fracture or acute facet abnormality is noted. The odontoid is within normal limits. Mild facet hypertrophic changes are noted. Soft tissues and spinal canal: No acute soft tissue abnormality is seen. Upper chest: Within normal limits. Other: None IMPRESSION: CT of the head: Chronic changes similar to that seen on the prior exam. New scalp hematoma posteriorly. CT of the cervical spine: Mild degenerative change without acute abnormality. Electronically Signed   By: Alcide Clever M.D.   On: 04/18/2017 16:53    Procedures Procedures (including critical care time)  Medications Ordered in ED Medications - No data to display   Initial Impression / Assessment and Plan / ED Course  I have reviewed the triage vital signs and the nursing notes.  Pertinent labs & imaging results that were available during my care of the patient were reviewed by me and considered in my medical decision making (see chart for details).     Patient here for evaluation of injuries following a mechanical fall.  She does have a hematoma examination and complaints of dizziness.  CT head and C-spine obtained based on age and symptoms.  Patient doing well in the emergency department and able to ambulate with steady gait.  No evidence of serious intracranial injury.  Discussed with patient home care for injuries following a fall.  Discussed safety with ambulating stairs.  Discussed outpatient follow-up and return precautions.  Final Clinical Impressions(s) / ED Diagnoses   Final diagnoses:  Fall, initial encounter  Contusion of scalp, initial encounter    ED Discharge Orders    None       Tilden Fossa, MD 04/18/17 1704

## 2017-04-25 ENCOUNTER — Other Ambulatory Visit: Payer: Self-pay | Admitting: Neurology

## 2017-04-25 MED ORDER — LAMOTRIGINE ER 100 MG PO TB24: 100.0000 mg | ORAL_TABLET | Freq: Every day | ORAL | 5 refills | Status: DC

## 2017-04-25 NOTE — Addendum Note (Signed)
Addended by: [place] PinkHESSON, Ebba Goll C on: 04/25/2017 01:13 PM   Modules accepted: Orders

## 2017-04-25 NOTE — Telephone Encounter (Signed)
Crystal with [facility] just received a fax from Dr. Vickey Hugerohmeier regarding a request they sent to her for Lamictal ER 100mg  #90. The fax did not say if the Rx can be filled.

## 2017-04-25 NOTE — Telephone Encounter (Signed)
Lam[facility]rigine ER e scribed to [facility] [address]

## 2017-04-28 ENCOUNTER — Other Ambulatory Visit: Payer: Self-pay | Admitting: Adult Health

## 2017-05-16 ENCOUNTER — Telehealth: Payer: Self-pay | Admitting: Neurology

## 2017-05-16 ENCOUNTER — Ambulatory Visit: Payer: PRIVATE HEALTH INSURANCE | Admitting: Neurology

## 2017-05-16 NOTE — Telephone Encounter (Signed)
[facility] lives [facility] and needs a written reminder of her appointment, to be shared with her son and POA.

## 2017-05-17 ENCOUNTER — Encounter: Payer: Self-pay | Admitting: Neurology

## 2017-10-24 ENCOUNTER — Other Ambulatory Visit: Payer: Self-pay | Admitting: Neurology

## 2017-10-24 ENCOUNTER — Other Ambulatory Visit: Payer: Self-pay | Admitting: Adult Health

## 2017-10-31 ENCOUNTER — Ambulatory Visit: Payer: PRIVATE HEALTH INSURANCE | Admitting: Adult Health

## 2017-10-31 ENCOUNTER — Encounter: Payer: Self-pay | Admitting: Adult Health

## 2017-10-31 VITALS — BP 131/72 | HR 88 | Ht 63.0 in | Wt 136.0 lb

## 2017-10-31 DIAGNOSIS — R569 Unspecified convulsions: Secondary | ICD-10-CM

## 2017-10-31 DIAGNOSIS — Z5181 Encounter for therapeutic drug level monitoring: Secondary | ICD-10-CM

## 2017-10-31 NOTE — Patient Instructions (Signed)
Your Plan:  Continue Depakote ER 500 daily and Lamictal ER 100 mg daily Blood work today If your symptoms worsen or you develop new symptoms please let us know.    Thank you for coming to see us at [facility]. I hope we have been able to provide you high quality care today.  You may receive a patient satisfaction survey over the next few weeks. We would appreciate your feedback and comments so that we may continue to improve ourselves and the health of our patients.

## 2017-10-31 NOTE — Progress Notes (Signed)
PATIENT: Shannon Barry DOB: [birth year]  REASON FOR VISIT: follow up HISTORY FROM: patient  HISTORY OF PRESENT ILLNESS: Today 10/31/17 [name] is a 67 year old female with a history of seizures.  She returns today for follow-up.  She is currently on Depakote taking 500 mg extended release as well as a Lamictal ER 100 mg daily.  She denies any seizure events.  She continues to live at [facility].  Reports that she is able to complete all ADLs independently.  She does not operate a motor vehicle.  Denies any changes in her gait or balance.  Denies any falls.  She returns today for evaluation.  HISTORY 11/10/16: [name] is a 67 year old female with a history of seizures. She returns today for follow-up. She has no family here with her today. We did call her son who manages her medications. He states that she is taking Depakote 750 mg daily extended release and lamotrigine extended release 100 mg daily. The patient denies any seizure events. She lives [facility]. She is able to complete all ADLs independently. She does not operate a motor vehicle. Denies any changes with her gait or balance. No change in mood or behavior. She returns today for an evaluation.    REVIEW OF SYSTEMS: Out of a complete 14 system review of symptoms, the patient complains only of the following symptoms, and all other reviewed systems are negative.  See HPI  ALLERGIES: Allergies  Allergen Reactions  . Penicillins Hives     use.   . Sulfa Antibiotics Other (See Comments)    violently ill    HOME MEDICATIONS: Outpatient Medications Prior to Visit  Medication Sig Dispense Refill  . Calcium Carbonate-Vitamin D (CALCIUM-VITAMIN D) 600-125 MG-UNIT TABS Take 1 tablet by mouth daily.    Marland Kitchen. desvenlafaxine (PRISTIQ) 100 MG 24 hr tablet Take 50 mg by mouth daily.     . divalproex (DEPAKOTE ER) 500 MG 24 hr tablet TAKE 1 TABLET BY MOUTH EVERY DAY 30 tablet 5  . docusate sodium (COLACE) 100 MG  capsule Take 100 mg by mouth 2 (two) times daily.    Marland Kitchen. ibuprofen (ADVIL,MOTRIN) 200 MG tablet Take 200 mg by mouth 2 (two) times daily.     . LamoTRIgine 100 MG TB24 24 hour tablet TAKE 1 TABLET BY MOUTH EVERYDAY AT BEDTIME 30 tablet 0  . levothyroxine (SYNTHROID, LEVOTHROID) 100 MCG tablet Take 100 mcg by mouth daily before breakfast.    . meclizine (ANTIVERT) 12.5 MG tablet Take 12.5 mg by mouth 3 (three) times daily as needed for dizziness.    . lamoTRIgine (LAMICTAL) 25 MG tablet Take 25 mg bid po for 14 days, than 50 mg bid for th remainder of the month. 120 tablet 5   No facility-administered medications prior to visit.     PAST MEDICAL HISTORY: Past Medical History:  Diagnosis Date  . Allergy   . Depression   . Seizures (HCC)   . Thyroid disease   . Traumatic brain injury (HCC)     PAST SURGICAL HISTORY: No past surgical history on file.  FAMILY HISTORY: No family history on file.  SOCIAL HISTORY: Social History   Socioeconomic History  . Marital status: Married    Spouse name: Not on file  . Number of children: Not on file  . Years of education: Not on file  . Highest education level: Not on file  Occupational History  . Not on file  Social Needs  . Financial resource strain:  Not on file  . Food insecurity:    Worry: Not on file    Inability: Not on file  . Transportation needs:    Medical: Not on file    Non-medical: Not on file  Tobacco Use  . Smoking status: Never Smoker  . Smokeless tobacco: Never Used  Substance and Sexual Activity  . Alcohol use: No    Alcohol/week: 0.0 oz  . Drug use: No  . Sexual activity: Not on file  Lifestyle  . Physical activity:    Days per week: Not on file    Minutes per session: Not on file  . Stress: Not on file  Relationships  . Social connections:    Talks on phone: Not on file    Gets together: Not on file    Attends religious service: Not on file    Active member of club or organization: Not on file     Attends meetings of clubs or organizations: Not on file    Relationship status: Not on file  . Intimate partner violence:    Fear of current or ex partner: Not on file    Emotionally abused: Not on file    Physically abused: Not on file    Forced sexual activity: Not on file  Other Topics Concern  . Not on file  Social History Narrative  . Not on file      PHYSICAL EXAM  Vitals:   10/31/17 1510  BP: 131/72  Pulse: 88  Weight: 136 lb (61.7 kg)  Height: 5\' 3"  (1.6 m)   Body mass index is 24.09 kg/m.  Generalized: Well developed, in no acute distress   Neurological examination  Mentation: Alert oriented to time, place, history taking. Follows all commands speech and language fluent Cranial nerve II-XII: Pupils were equal round reactive to light. Extraocular movements were full, visual field were full on confrontational test. Facial sensation and strength were normal. Uvula tongue midline. Head turning and shoulder shrug  were normal and symmetric. Motor: The motor testing reveals 5 over 5 strength of all 4 extremities. Good symmetric motor tone is noted throughout.  Sensory: Sensory testing is intact to soft touch on all 4 extremities. No evidence of extinction is noted.  Coordination: Cerebellar testing reveals good finger-nose-finger and heel-to-shin bilaterally.  Gait and station: Gait is normal. Tandem gait is normal. Romberg is negative. No drift is seen.  Reflexes: Deep tendon reflexes are symmetric and normal bilaterally.   DIAGNOSTIC DATA (LABS, IMAGING, TESTING) - I reviewed patient records, labs, notes, testing and imaging myself where available.  Lab Results  Component Value Date   WBC 5.6 04/05/2016   HGB 13.8 04/05/2016   HCT 40.6 04/05/2016   MCV 91 04/05/2016   PLT 233 04/05/2016      Component Value Date/Time   NA 136 11/10/2016 1544   K 4.8 11/10/2016 1544   CL 98 11/10/2016 1544   CO2 20 11/10/2016 1544   GLUCOSE 90 11/10/2016 1544   GLUCOSE 94  12/28/2015 1210   BUN 8 11/10/2016 1544   CREATININE 0.48 (L) 11/10/2016 1544   CALCIUM 9.5 11/10/2016 1544   PROT 6.3 11/10/2016 1544   ALBUMIN 3.9 11/10/2016 1544   AST 20 11/10/2016 1544   ALT 12 11/10/2016 1544   ALKPHOS 63 11/10/2016 1544   BILITOT <0.2 11/10/2016 1544   GFRNONAA 104 11/10/2016 1544   GFRAA 120 11/10/2016 1544    Lab Results  Component Value Date   TSH 8.153 (H) 07/12/2015  ASSESSMENT AND PLAN 67 y.o. year old female  has a past medical history of Allergy, Depression, Seizures (HCC), Thyroid disease, and Traumatic brain injury (HCC). here with:  1.  Seizures  Overall the patient is doing well.  She will continue on Depakote and Lamictal.  I will check blood work today.  She is advised that she has any seizure events she should let us know.  She will follow-up in 6 months or sooner if needed.   I spent 15 minutes with the patient. 50% of this time was spent discussing her medication   Butch Penny, MSN, NP-C 10/31/2017, 3:12 PM [facility] [address] [place], [place] [address] [phone]

## 2017-11-02 LAB — COMPREHENSIVE METABOLIC PANEL
A/G RATIO: 1.8 (ref 1.2–2.2)
ALBUMIN: 4.2 g/dL (ref 3.6–4.8)
ALK PHOS: 63 IU/L (ref 39–117)
ALT: 11 IU/L (ref 0–32)
AST: 16 IU/L (ref 0–40)
BUN / CREAT RATIO: 9 — AB (ref 12–28)
BUN: 6 mg/dL — ABNORMAL LOW (ref 8–27)
CO2: 22 mmol/L (ref 20–29)
Calcium: 9.2 mg/dL (ref 8.7–10.3)
Chloride: 98 mmol/L (ref 96–106)
Creatinine, Ser: 0.64 mg/dL (ref 0.57–1.00)
GFR calc Af Amer: 108 mL/min/{1.73_m2} (ref >59)
GFR calc non Af Amer: 94 mL/min/{1.73_m2} (ref >59)
Globulin, Total: 2.4 g/dL (ref 1.5–4.5)
Glucose: 93 mg/dL (ref 65–99)
POTASSIUM: 4.4 mmol/L (ref 3.5–5.2)
Sodium: 135 mmol/L (ref 134–144)
Total Protein: 6.6 g/dL (ref 6.0–8.5)

## 2017-11-02 LAB — CBC WITH DIFFERENTIAL/PLATELET
Basophils Absolute: 0 10*3/uL (ref 0.0–0.2)
Basos: 0 %
EOS (ABSOLUTE): 0 10*3/uL (ref 0.0–0.4)
Eos: 0 %
Hematocrit: 40.8 % (ref 34.0–46.6)
Hemoglobin: 13.6 g/dL (ref 11.1–15.9)
IMMATURE GRANULOCYTES: 0 %
Immature Grans (Abs): 0 10*3/uL (ref 0.0–0.1)
LYMPHS ABS: 1.7 10*3/uL (ref 0.7–3.1)
Lymphs: 32 %
MCH: 31.1 pg (ref 26.6–33.0)
MCHC: 33.3 g/dL (ref 31.5–35.7)
MCV: 93 fL (ref 79–97)
MONOS ABS: 0.5 10*3/uL (ref 0.1–0.9)
Monocytes: 9 %
NEUTROS PCT: 59 %
Neutrophils Absolute: 3.2 10*3/uL (ref 1.4–7.0)
PLATELETS: 230 10*3/uL (ref 150–450)
RBC: 4.38 x10E6/uL (ref 3.77–5.28)
RDW: 13.3 % (ref 12.3–15.4)
WBC: 5.4 10*3/uL (ref 3.4–10.8)

## 2017-11-02 LAB — LAMOTRIGINE LEVEL: LAMOTRIGINE LVL: 3.9 ug/mL (ref 2.0–20.0)

## 2017-11-02 LAB — AMMONIA: Ammonia: 58 ug/dL (ref 19–87)

## 2017-11-02 LAB — VALPROIC ACID LEVEL: Valproic Acid Lvl: 36 ug/mL — ABNORMAL LOW (ref 50–100)

## 2017-11-02 NOTE — Progress Notes (Signed)
I agree with the assessment and plan as directed by NP .The patient is known to me and sees me in alternating visits .   Zakhia Seres, MD

## 2017-11-03 ENCOUNTER — Telehealth: Payer: Self-pay | Admitting: *Deleted

## 2017-11-03 NOTE — Telephone Encounter (Signed)
-----   Message from Butch PennyMegan Millikan, NP sent at 11/02/2017 11:15 AM EDT ----- Lab work unremarkable. Encourage patient to stay hydrated

## 2017-11-03 NOTE — Telephone Encounter (Signed)
LMVM for pt on husbands VM that lab results unremarkable. Encourage pt to stay well hydrated. To call back if questions.

## 2017-11-21 ENCOUNTER — Other Ambulatory Visit: Payer: Self-pay | Admitting: Neurology

## 2017-12-07 ENCOUNTER — Other Ambulatory Visit: Payer: Self-pay | Admitting: Adult Health

## 2018-06-22 ENCOUNTER — Other Ambulatory Visit: Payer: Self-pay | Admitting: Adult Health

## 2018-07-12 ENCOUNTER — Ambulatory Visit: Payer: Self-pay | Admitting: Family Medicine

## 2018-07-12 ENCOUNTER — Encounter: Payer: Self-pay | Admitting: Family Medicine

## 2018-07-12 VITALS — BP 103/65 | HR 96 | Ht 63.0 in | Wt 117.4 lb

## 2018-07-12 DIAGNOSIS — R569 Unspecified convulsions: Secondary | ICD-10-CM

## 2018-07-12 MED ORDER — LAMOTRIGINE ER 100 MG PO TB24: ORAL_TABLET | ORAL | 2 refills | Status: DC

## 2018-07-12 MED ORDER — LAMOTRIGINE ER 100 MG PO TB24: ORAL_TABLET | ORAL | 5 refills | Status: DC

## 2018-07-12 MED ORDER — DIVALPROEX SODIUM ER 500 MG PO TB24: 500.0000 mg | ORAL_TABLET | Freq: Every day | ORAL | 2 refills | Status: DC

## 2018-07-12 NOTE — Patient Instructions (Signed)
Refilled Depakote and Lamictal today Encouraged to take daily, without missed doses, she was instructed to call for refill as needed to avoid missing medication.   Seizure, Adult When you have a seizure:  Parts of your body may move.  You may have a change in how aware or awake (conscious) you are.  You may shake (convulse). Seizures usually last from 30 seconds to 2 minutes. Usually, they are not harmful unless they last a long time. What are the signs or symptoms? Common symptoms of this condition include:  Shaking (convulsions).  Stiffness in the body.  Passing out (losing consciousness).  Uncontrolled movements in the: ? Arms or legs. ? Eyes. ? Head. ? Mouth. Some people have symptoms right before a seizure happens. These symptoms may include:  Fear.  Worry (anxiety).  Feeling like you are going to throw up (nausea).  Feeling like the room is spinning (vertigo).  Feeling like you saw or heard something before (dj vu).  Odd tastes or smells.  Changes in vision, such as seeing flashing lights or spots. Follow these instructions at home: Medicines   Take over-the-counter and prescription medicines only as told by your doctor.  Do not eat or drink anything that may keep your medicine from working, such as alcohol. Activity  Do not do any activities that would be dangerous if you had another seizure, like driving or swimming. Wait until your doctor says it is safe for you to do them.  If you live in the [place] ask your local DMV (department of motor vehicles) when you can drive.  Get plenty of rest. Teaching others   Teach friends and family what to do when you have a seizure. They should: ? Lay you on the ground. ? Protect your head and body. ? Loosen any tight clothing around your neck. ? Turn you on your side. ? Not hold you down. ? Not put anything into your mouth. ? Know whether or not you need emergency care. ? Stay with you until you are  better. General instructions  Contact your doctor each time you have a seizure.  Avoid anything that gives you seizures.  Keep a seizure diary. Write down: ? What you think caused each seizure. ? What you remember about each seizure.  Keep all follow-up visits as told by your doctor. This is important. Contact a doctor if:  You have another seizure.  You have seizures more often.  There is any change in what happens during your seizures.  You keep having seizures with treatment.  You have symptoms of being sick or having an infection. Get help right away if:  You have a seizure: ? That lasts longer than 5 minutes. ? That is different than seizures you had before. ? That makes it harder to breathe. ? After you hurt your head.  After a seizure, you cannot speak or use a part of your body.  After a seizure, you are confused or have a bad headache.  You have two or more seizures in a row.  You are having seizures more often.  You do not wake up right after a seizure.  You get hurt during a seizure. In an emergency:  These symptoms may be an emergency. Do not wait to see if the symptoms will go away. Get medical help right away. Call your local emergency services (911 in the [place] Do not drive yourself to the hospital. Summary  Seizures usually last from 30 seconds to 2 minutes. Usually,  they are not harmful unless they last a long time.  Do not eat or drink anything that may keep your medicine from working, such as alcohol.  Teach friends and family what to do when you have a seizure.  Contact your doctor each time you have a seizure. This information is not intended to replace advice given to you by your health care provider. Make sure you discuss any questions you have with your health care provider. Document Released: 11/02/2007 Document Revised: 02/07/2018 Document Reviewed: 06/22/2017 Elsevier Interactive Patient Education  2019 [employer].

## 2018-07-12 NOTE — Progress Notes (Signed)
PATIENT: Shannon Barry DOB: June 06, 1950  REASON FOR VISIT: follow up HISTORY FROM: patient  Chief Complaint  Patient presents with  . Follow-up    Medication follow up. Alone. New room. Patient stated that she needs a refill on her Lamictal. No recent episodes.      HISTORY OF PRESENT ILLNESS: Today 07/12/18 Shannon Barry is a 68 y.o. female here today for follow up. She reports feeling well. She is taking lamotrigine 100mg  ER and divalproex 500mg  ER every night. She denies seizure activity. She is able to perform ADL's. She has not driven in 20+ years. She is currently living with her son who helps her with medications. She is feeling well today and without complaints.   HISTORY: (copied from [place] Barry's note on 10/31/2017) Ms. [name] is a 68 year old female with a history of seizures.  She returns today for follow-up.  She is currently on Depakote taking 500 mg extended release as well as a Lamictal ER 100 mg daily.  She denies any seizure events.  [facility].  Reports that she is able to complete all ADLs independently.  She does not operate a motor vehicle.  Denies any changes in her gait or balance.  Denies any falls.  She returns today for evaluation.  REVIEW OF SYSTEMS: Out of a complete 14 system review of symptoms, the patient complains only of the following symptoms, confusion and all other reviewed systems are negative.  ALLERGIES: Allergies  Allergen Reactions  . Penicillins Hives    Has patient had a PCN reaction causing immediate rash, facial/tongue/throat swelling, SOB or lightheadedness with hypotension: No Has patient had a PCN reaction causing severe rash involving mucus membranes or skin necrosis: Yes Has patient had a PCN reaction that required hospitalization No Has patient had a PCN reaction occurring within the last 10 years: Yes If all of the above answers are "NO", then may proceed with Cephalosporin use.   . Sulfa  Antibiotics Other (See Comments)    violently ill    HOME MEDICATIONS: Outpatient Medications Prior to Visit  Medication Sig Dispense Refill  . Calcium Carbonate-Vitamin D (CALCIUM-VITAMIN D) 600-125 MG-UNIT TABS Take 1 tablet by mouth daily.    Marland Kitchen. desvenlafaxine (PRISTIQ) 100 MG 24 hr tablet Take 50 mg by mouth daily.     Marland Kitchen. docusate sodium (COLACE) 100 MG capsule Take 100 mg by mouth 2 (two) times daily.    Marland Kitchen. ibuprofen (ADVIL,MOTRIN) 200 MG tablet Take 200 mg by mouth 2 (two) times daily.     Marland Kitchen. levothyroxine (SYNTHROID, LEVOTHROID) 100 MCG tablet Take 100 mcg by mouth daily before breakfast.    . meclizine (ANTIVERT) 12.5 MG tablet Take 12.5 mg by mouth 3 (three) times daily as needed for dizziness.    . divalproex (DEPAKOTE ER) 500 MG 24 hr tablet TAKE 1 TABLET BY MOUTH EVERY DAY 90 tablet 2  . LamoTRIgine 100 MG TB24 24 hour tablet TAKE 1 TABLET BY MOUTH EVERYDAY AT BEDTIME 30 tablet 5   No facility-administered medications prior to visit.     PAST MEDICAL HISTORY: Past Medical History:  Diagnosis Date  . Allergy   . Depression   . Seizures (HCC)   . Thyroid disease   . Traumatic brain injury (HCC)     PAST SURGICAL HISTORY: History reviewed. No pertinent surgical history.  FAMILY HISTORY: History reviewed. No pertinent family history.  SOCIAL HISTORY: Social History   Socioeconomic History  . Marital status: Married  Spouse name: Not on file  . Number of children: Not on file  . Years of education: Not on file  . Highest education level: Not on file  Occupational History  . Not on file  Social Needs  . Financial resource strain: Not on file  . Food insecurity:    Worry: Not on file    Inability: Not on file  . Transportation needs:    Medical: Not on file    Non-medical: Not on file  Tobacco Use  . Smoking status: Never Smoker  . Smokeless tobacco: Never Used  Substance and Sexual Activity  . Alcohol use: No    Alcohol/week: 0.0 standard drinks  . Drug  use: No  . Sexual activity: Not on file  Lifestyle  . Physical activity:    Days per week: Not on file    Minutes per session: Not on file  . Stress: Not on file  Relationships  . Social connections:    Talks on phone: Not on file    Gets together: Not on file    Attends religious service: Not on file    Active member of club or organization: Not on file    Attends meetings of clubs or organizations: Not on file    Relationship status: Not on file  . Intimate partner violence:    Fear of current or ex partner: Not on file    Emotionally abused: Not on file    Physically abused: Not on file    Forced sexual activity: Not on file  Other Topics Concern  . Not on file  Social History Narrative  . Not on file      PHYSICAL EXAM  Vitals:   07/12/18 0815  BP: 103/65  Pulse: 96  Weight: 117 lb 6.4 oz (53.3 kg)  Height: 5\' 3"  (1.6 m)   Body mass index is 20.8 kg/m.  Generalized: Well developed, in no acute distress  Cardiology: normal rate and rhythm, no murmur noted Neurological examination  Mentation: Alert oriented to time, place, history taking. Follows all commands speech and language fluent Cranial nerve II-XII: Pupils were equal round reactive to light. Extraocular movements were full, visual field were full on confrontational test. Facial sensation and strength were normal. Uvula tongue midline. Head turning and shoulder shrug  were normal and symmetric. Motor: The motor testing reveals 5 over 5 strength of all 4 extremities. Good symmetric motor tone is noted throughout.  Sensory: Sensory testing is intact to soft touch on all 4 extremities. No evidence of extinction is noted.  Coordination: Cerebellar testing reveals good finger-nose-finger bilaterally Gait and station: Gait is normal.  DIAGNOSTIC DATA (LABS, IMAGING, TESTING) - I reviewed patient records, labs, notes, testing and imaging myself where available.  MMSE - Mini Mental State Exam 10/31/2017 08/03/2016  04/05/2016  Orientation to time 5 4 4   Orientation to Place 5 5 3   Registration 3 3 3   Attention/ Calculation 4 3 4   Recall 3 1 3   Language- name 2 objects 2 2 2   Language- repeat 1 1 1   Language- follow 3 step command 3 3 3   Language- read & follow direction 1 1 1   Write a sentence 1 1 1   Write a sentence-comments - - -  Copy design 1 0 1  Total score 29 24 26      Lab Results  Component Value Date   WBC 5.4 10/31/2017   HGB 13.6 10/31/2017   HCT 40.8 10/31/2017   MCV 93 10/31/2017  PLT 230 10/31/2017      Component Value Date/Time   NA 135 10/31/2017 1530   K 4.4 10/31/2017 1530   CL 98 10/31/2017 1530   CO2 22 10/31/2017 1530   GLUCOSE 93 10/31/2017 1530   GLUCOSE 94 12/28/2015 1210   BUN 6 (L) 10/31/2017 1530   CREATININE 0.64 10/31/2017 1530   CALCIUM 9.2 10/31/2017 1530   PROT 6.6 10/31/2017 1530   ALBUMIN 4.2 10/31/2017 1530   AST 16 10/31/2017 1530   ALT 11 10/31/2017 1530   ALKPHOS 63 10/31/2017 1530   BILITOT <0.2 10/31/2017 1530   GFRNONAA 94 10/31/2017 1530   GFRAA 108 10/31/2017 1530   No results found for: CHOL, HDL, LDLCALC, LDLDIRECT, TRIG, CHOLHDL No results found for: OQHU7M No results found for: VITAMINB12 Lab Results  Component Value Date   TSH 8.153 (H) 07/12/2015    ASSESSMENT AND PLAN 68 y.o. year old female  has a past medical history of Allergy, Depression, Seizures (HCC), Thyroid disease, and Traumatic brain injury (HCC). here with     ICD-10-CM   1. Seizures (HCC) R56.9     Ms. Montanye reports that she is doing well.  She is taking medications daily as prescribed.  She does admit to missing the last 2 days of her Lamictal.  She reports that her prescription had ran out and she knew she was following up today.  She is here today with her son and reports they are going straight to the pharmacy to pick up the medication.  She has not had any recent seizure activity.  She is tolerating medications well.  I have encouraged her to take  medications every day without missed doses.  We will see her back in about a year.   No orders of the defined types were placed in this encounter.    Meds ordered this encounter  Medications  . DISCONTD: LamoTRIgine 100 MG TB24 24 hour tablet    Sig: TAKE 1 TABLET BY MOUTH EVERYDAY AT BEDTIME    Dispense:  30 tablet    Refill:  5  . divalproex (DEPAKOTE ER) 500 MG 24 hr tablet    Sig: Take 1 tablet (500 mg total) by mouth daily.    Dispense:  90 tablet    Refill:  2    Supervising Physician: Melvyn Novas, MD NPI: [id number] DEA: [id number]    Order Specific Question:   Supervising Provider    Answer:   Anson Fret [[id number]]  . LamoTRIgine 100 MG TB24 24 hour tablet    Sig: TAKE 1 TABLET BY MOUTH EVERYDAY AT BEDTIME    Dispense:  90 tablet    Refill:  2    Order Specific Question:   Supervising Provider    Answer:   Anson Fret [[id number]]      I spent 15 minutes with the patient. 50% of this time was spent counseling and educating patient on plan of care and medications.    Shawnie Dapper, FNP-C 07/12/2018, 8:39 AM [facility] [address], [address] [place], [place] [address] [phone]

## 2018-07-22 ENCOUNTER — Other Ambulatory Visit: Payer: Self-pay

## 2018-07-22 ENCOUNTER — Encounter (HOSPITAL_COMMUNITY): Payer: Self-pay | Admitting: Emergency Medicine

## 2018-07-22 ENCOUNTER — Emergency Department (HOSPITAL_COMMUNITY)
Admission: EM | Admit: 2018-07-22 | Discharge: 2018-07-22 | Disposition: A | Discharge status: Home / Self Care | Payer: Medicare Other | Attending: Emergency Medicine | Admitting: Emergency Medicine

## 2018-07-22 DIAGNOSIS — S76312A Strain of muscle, fascia and tendon of the posterior muscle group at thigh level, left thigh, initial encounter: Secondary | ICD-10-CM | POA: Insufficient documentation

## 2018-07-22 DIAGNOSIS — Z79899 Other long term (current) drug therapy: Secondary | ICD-10-CM | POA: Diagnosis not present

## 2018-07-22 DIAGNOSIS — Y929 Unspecified place or not applicable: Secondary | ICD-10-CM | POA: Diagnosis not present

## 2018-07-22 DIAGNOSIS — G309 Alzheimer's disease, unspecified: Secondary | ICD-10-CM | POA: Insufficient documentation

## 2018-07-22 DIAGNOSIS — X509XXA Other and unspecified overexertion or strenuous movements or postures, initial encounter: Secondary | ICD-10-CM | POA: Insufficient documentation

## 2018-07-22 DIAGNOSIS — T148XXA Other injury of unspecified body region, initial encounter: Secondary | ICD-10-CM

## 2018-07-22 DIAGNOSIS — F0390 Unspecified dementia without behavioral disturbance: Secondary | ICD-10-CM | POA: Diagnosis not present

## 2018-07-22 DIAGNOSIS — Y9301 Activity, walking, marching and hiking: Secondary | ICD-10-CM | POA: Insufficient documentation

## 2018-07-22 DIAGNOSIS — S76302A Unspecified injury of muscle, fascia and tendon of the posterior muscle group at thigh level, left thigh, initial encounter: Secondary | ICD-10-CM

## 2018-07-22 DIAGNOSIS — Y999 Unspecified external cause status: Secondary | ICD-10-CM | POA: Diagnosis not present

## 2018-07-22 DIAGNOSIS — M79605 Pain in left leg: Secondary | ICD-10-CM | POA: Diagnosis present

## 2018-07-22 MED ORDER — KETOROLAC TROMETHAMINE 60 MG/2ML IM SOLN
30.0000 mg | Freq: Once | INTRAMUSCULAR | Status: AC
  Administered 2018-07-22: 30 mg via INTRAMUSCULAR
  Filled 2018-07-22: qty 2

## 2018-07-22 MED ORDER — CYCLOBENZAPRINE HCL 10 MG PO TABS
5.0000 mg | ORAL_TABLET | Freq: Once | ORAL | Status: AC
Start: 2018-07-22 — End: 2018-07-22
  Administered 2018-07-22: 5 mg via ORAL
  Filled 2018-07-22: qty 1

## 2018-07-22 MED ORDER — TRAMADOL HCL 50 MG PO TABS: 50.0000 mg | ORAL_TABLET | Freq: Four times a day (QID) | ORAL | 0 refills | Status: DC | PRN

## 2018-07-22 NOTE — ED Triage Notes (Signed)
Pt states she was carrying a laundry basket incorrectly 4 days ago and felt back pain. Now pain is in LLE, constant dull pain and sharp shooting intermittent pain. Pt has taken ibuprofen for pain with no relief.

## 2018-07-22 NOTE — ED Notes (Signed)
Bed: WTR8 Expected date:  Expected time:  Means of arrival:  Comments: 

## 2018-07-22 NOTE — Discharge Instructions (Addendum)
Continue to take ibuprofen for the pain. Do not drive while taking the tramadol as it will make you sleepy.

## 2018-07-22 NOTE — ED Provider Notes (Signed)
[facility] Note   CSN: [id number] Arrival date & time: 07/22/18  1023    History   Chief Complaint Chief Complaint  Patient presents with  . Leg Pain    HPI Shannon Barry is a 68 y.o. female with hx of traumatic brain injury and alcohol abuse who presents to the ED left leg pain. Patient reports she was carrying a laundry basket 4 days ago and felt back pain and since then the pain has increased and radiates to the LLE. She describes the pains as constant dull pain with intermittent shooting pain. Patient taking ibuprofen without relief. Patient reports she does not feel pain in her back now just her left upper leg.      HPI  Past Medical History:  Diagnosis Date  . Allergy   . Depression   . Seizures (HCC)   . Thyroid disease   . Traumatic brain injury [facility])     Patient Active Problem List   Diagnosis Date Noted  . 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 History of alcohol abuse 11/10/2015  . Alzheimer's disease (HCC) 11/10/2015  . 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 Alcohol abuse 09/24/2015  . OCD (obsessive compulsive disorder) 09/24/2015  . Hyponatremia 07/11/2015  . Chest pain 07/11/2015  . Nausea and vomiting 07/11/2015  . Seizures (HCC) 07/11/2015  . Vertigo   . 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 TBI (traumatic brain injury) (HCC) 05/04/2015  . 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 Dementia with behavioral disturbance (HCC) 03/18/2015  . 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 No pertinent surgical history.   OB History   No obstetric history on file.      Home Medications    Prior to Admission medications   Medication Sig Start Date End Date Taking? Authorizing Provider  Calcium Carbonate-Vitamin D (CALCIUM-VITAMIN D) 600-125 MG-UNIT TABS Take 1 tablet by  mouth daily.    [provider]  desvenlafaxine (PRISTIQ) 100 MG 24 hr tablet Take 50 mg by mouth daily.     [provider]  divalproex (DEPAKOTE ER) 500 MG 24 hr tablet Take 1 tablet (500 mg total) by mouth daily. 07/12/18   Lomax, Amy, NP  docusate sodium (COLACE) 100 MG capsule Take 100 mg by mouth 2 (two) times daily.    [provider]  ibuprofen (ADVIL,MOTRIN) 200 MG tablet Take 200 mg by mouth 2 (two) times daily.     [provider]  LamoTRIgine 100 MG TB24 24 hour tablet TAKE 1 TABLET BY MOUTH EVERYDAY AT BEDTIME 07/12/18   Lomax, Amy, NP  levothyroxine (SYNTHROID, LEVOTHROID) 100 MCG tablet Take 100 mcg by mouth daily before breakfast.    [provider]  meclizine (ANTIVERT) 12.5 MG tablet Take 12.5 mg by mouth 3 (three) times daily as needed for dizziness.    [provider]  traMADol (ULTRAM) 50 MG tablet Take 1 tablet (50 mg total) by mouth every 6 (six) hours as needed. 07/22/18   [name], NP    Family History No family history on file.  Social History Social History   Tobacco Use  . Smoking status: Never Smoker  . Smokeless tobacco: Never Used  Substance Use Topics  . Alcohol use: No    Alcohol/week: 0.0 standard drinks  . Drug use: No     Allergies   Penicillins and Sulfa antibiotics  Review of Systems Review of Systems  Musculoskeletal: Positive for arthralgias.  All other systems reviewed and are negative.    Physical Exam Updated Vital Signs BP 132/65   Pulse 70   Temp 98.2 F (36.8 C) (Oral)   Resp 16   SpO2 100%   Physical Exam Vitals signs and nursing note reviewed.  Constitutional:      Appearance: She is well-developed.     Comments: Thin white female  HENT:     Head: Normocephalic.     Nose: Nose normal.  Eyes:     Extraocular Movements: Extraocular movements intact.     Conjunctiva/sclera: Conjunctivae normal.  Neck:     Musculoskeletal: Neck supple.  Cardiovascular:      Rate and Rhythm: Normal rate.  Pulmonary:     Effort: Pulmonary effort is normal.     Breath sounds: Normal breath sounds.  Musculoskeletal: Normal range of motion.  Skin:    General: Skin is warm and dry.  Neurological:     Mental Status: She is alert and oriented to person, place, and time.     Gait: Gait normal.     Deep Tendon Reflexes:     Reflex Scores:      Bicep reflexes are 2+ on the right side and 2+ on the left side.      Brachioradialis reflexes are 2+ on the right side and 2+ on the left side.      Patellar reflexes are 2+ on the right side and 2+ on the left side.    Comments: Grips are equal, radial pulses and pedal pulses 2+. Patient able to ambulate without difficulty. Straight leg raises without difficulty.      ED Treatments / Results  Labs (all labs ordered are listed, but only abnormal results are displayed) Labs Reviewed - No data to display  Radiology No results found.  Procedures Procedures (including critical care time)  Medications Ordered in ED Medications  cyclobenzaprine (FLEXERIL) tablet 5 mg (5 mg Oral Given 07/22/18 1301)  ketorolac (TORADOL) injection 30 mg (30 mg Intramuscular Given 07/22/18 1302)     Initial Impression / Assessment and Plan / ED Course  I have reviewed the triage vital signs and the nursing notes. Dr. Jeraldine Loots in to examine the patient and does not feel that there is concern for DVT. Will treat for muscle strain and patient to f/u with her PCP.   Final Clinical Impressions(s) / ED Diagnoses   Final diagnoses:  Muscle strain  Left hamstring injury, initial encounter    ED Discharge Orders         Ordered    traMADol (ULTRAM) 50 MG tablet  Every 6 hours PRN     07/22/18 1358           Damian [place], [place] 07/23/18 1432    Gerhard Munch, MD 07/26/18 1906

## 2018-11-01 ENCOUNTER — Telehealth: Payer: Self-pay | Admitting: *Deleted

## 2018-11-01 NOTE — Telephone Encounter (Signed)
Due to current COVID 19 pandemic, our office is severely reducing in office visits until further notice, in order to minimize the risk to our patients and healthcare providers.Unable to get in contact with the patient to convert their office visit with [facility] on 11/06/2018 into a doxy.me visit. I left a voicemail asking the patient to return my call. Office number was provided.     If patient calls back please convert their office visit into a doxy.me visit.

## 2018-11-05 NOTE — Telephone Encounter (Signed)
LMVM for pt on home # to call back to schedule VV.  If not heard by end of day, appt to be cancelled and she will need to call back and get rescheduled.

## 2018-11-06 ENCOUNTER — Ambulatory Visit: Payer: PRIVATE HEALTH INSURANCE | Admitting: Adult Health

## 2019-03-12 ENCOUNTER — Telehealth: Payer: Self-pay | Admitting: Family Medicine

## 2019-03-12 NOTE — Telephone Encounter (Signed)
Spoke personally with the provider Amy Lomax about the patients potential stroke. She states that it is in the patients best interest that she go to the Hospital since she is experiencing stroke like symptoms.   No answer left a voice mail regarding the stroke like symptoms.

## 2019-03-12 NOTE — Telephone Encounter (Signed)
Message sent to [facility] NP for [place].

## 2019-03-12 NOTE — Telephone Encounter (Signed)
A female called in stating he was the son of pt(never provided his name) he stated he urgently wanted pt seen for stroke like symptoms.  He was immediately told that if he felt like pt had or is having a stroke he needs to take pt to ED.  He went on to say  pt was going on long walks  as of last week and now suddenly acting like she had a stroke.  The man said pt can not walk without assistance.  The man again was told if pt is showing stroke like symptoms he needs to right away get her to the ED.  The man then said he doesn't think it is a stroke and hung up.  This is a FYI, he did not ask for anyone to call him nor did he give a # to call him back on.

## 2019-03-13 NOTE — Telephone Encounter (Signed)
Error

## 2019-03-14 ENCOUNTER — Inpatient Hospital Stay (HOSPITAL_COMMUNITY)
Admission: EM | Admit: 2019-03-14 | Discharge: 2019-03-20 | DRG: 871 | Disposition: A | Discharge status: Skilled Nursing Facility | Payer: 59 | Attending: Internal Medicine | Admitting: Internal Medicine

## 2019-03-14 ENCOUNTER — Encounter (HOSPITAL_COMMUNITY): Payer: Self-pay

## 2019-03-14 ENCOUNTER — Emergency Department (HOSPITAL_COMMUNITY): Payer: 59

## 2019-03-14 ENCOUNTER — Other Ambulatory Visit: Payer: Self-pay

## 2019-03-14 DIAGNOSIS — E872 Acidosis: Secondary | ICD-10-CM | POA: Diagnosis present

## 2019-03-14 DIAGNOSIS — F0280 Dementia in other diseases classified elsewhere without behavioral disturbance: Secondary | ICD-10-CM | POA: Diagnosis present

## 2019-03-14 DIAGNOSIS — E871 Hypo-osmolality and hyponatremia: Secondary | ICD-10-CM | POA: Diagnosis present

## 2019-03-14 DIAGNOSIS — G309 Alzheimer's disease, unspecified: Secondary | ICD-10-CM | POA: Diagnosis present

## 2019-03-14 DIAGNOSIS — Z20828 Contact with and (suspected) exposure to other viral communicable diseases: Secondary | ICD-10-CM | POA: Diagnosis present

## 2019-03-14 DIAGNOSIS — A419 Sepsis, unspecified organism: Principal | ICD-10-CM | POA: Diagnosis present

## 2019-03-14 DIAGNOSIS — F0281 Dementia in other diseases classified elsewhere with behavioral disturbance: Secondary | ICD-10-CM | POA: Diagnosis present

## 2019-03-14 DIAGNOSIS — Z880 Allergy status to penicillin: Secondary | ICD-10-CM

## 2019-03-14 DIAGNOSIS — Z79890 Hormone replacement therapy (postmenopausal): Secondary | ICD-10-CM | POA: Diagnosis not present

## 2019-03-14 DIAGNOSIS — E039 Hypothyroidism, unspecified: Secondary | ICD-10-CM | POA: Diagnosis present

## 2019-03-14 DIAGNOSIS — F1011 Alcohol abuse, in remission: Secondary | ICD-10-CM | POA: Diagnosis present

## 2019-03-14 DIAGNOSIS — F0390 Unspecified dementia without behavioral disturbance: Secondary | ICD-10-CM | POA: Diagnosis present

## 2019-03-14 DIAGNOSIS — F03918 Unspecified dementia, unspecified severity, with other behavioral disturbance: Secondary | ICD-10-CM | POA: Diagnosis present

## 2019-03-14 DIAGNOSIS — R561 Post traumatic seizures: Secondary | ICD-10-CM | POA: Diagnosis present

## 2019-03-14 DIAGNOSIS — F3160 Bipolar disorder, current episode mixed, unspecified: Secondary | ICD-10-CM | POA: Diagnosis present

## 2019-03-14 DIAGNOSIS — E861 Hypovolemia: Secondary | ICD-10-CM | POA: Diagnosis present

## 2019-03-14 DIAGNOSIS — Z79899 Other long term (current) drug therapy: Secondary | ICD-10-CM | POA: Diagnosis not present

## 2019-03-14 DIAGNOSIS — R6520 Severe sepsis without septic shock: Secondary | ICD-10-CM | POA: Diagnosis present

## 2019-03-14 DIAGNOSIS — J181 Lobar pneumonia, unspecified organism: Secondary | ICD-10-CM | POA: Diagnosis present

## 2019-03-14 DIAGNOSIS — F0391 Unspecified dementia with behavioral disturbance: Secondary | ICD-10-CM | POA: Diagnosis present

## 2019-03-14 DIAGNOSIS — S069XAA Unspecified intracranial injury with loss of consciousness status unknown, initial encounter: Secondary | ICD-10-CM | POA: Diagnosis present

## 2019-03-14 DIAGNOSIS — Z87820 Personal history of traumatic brain injury: Secondary | ICD-10-CM

## 2019-03-14 DIAGNOSIS — I959 Hypotension, unspecified: Secondary | ICD-10-CM

## 2019-03-14 DIAGNOSIS — G9341 Metabolic encephalopathy: Secondary | ICD-10-CM | POA: Diagnosis present

## 2019-03-14 DIAGNOSIS — E878 Other disorders of electrolyte and fluid balance, not elsewhere classified: Secondary | ICD-10-CM | POA: Diagnosis present

## 2019-03-14 DIAGNOSIS — S069X9A Unspecified intracranial injury with loss of consciousness of unspecified duration, initial encounter: Secondary | ICD-10-CM | POA: Diagnosis present

## 2019-03-14 DIAGNOSIS — D649 Anemia, unspecified: Secondary | ICD-10-CM | POA: Diagnosis present

## 2019-03-14 DIAGNOSIS — S069X3D Unspecified intracranial injury with loss of consciousness of 1 hour to 5 hours 59 minutes, subsequent encounter: Secondary | ICD-10-CM

## 2019-03-14 DIAGNOSIS — J189 Pneumonia, unspecified organism: Secondary | ICD-10-CM | POA: Diagnosis present

## 2019-03-14 LAB — COMPREHENSIVE METABOLIC PANEL
ALT: 19 U/L (ref 0–44)
AST: 21 U/L (ref 15–41)
Albumin: 2.3 g/dL — ABNORMAL LOW (ref 3.5–5.0)
Alkaline Phosphatase: 47 U/L (ref 38–126)
Anion gap: 10 (ref 5–15)
BUN: 9 mg/dL (ref 8–23)
CO2: 22 mmol/L (ref 22–32)
Calcium: 8 mg/dL — ABNORMAL LOW (ref 8.9–10.3)
Chloride: 94 mmol/L — ABNORMAL LOW (ref 98–111)
Creatinine, Ser: 0.53 mg/dL (ref 0.44–1.00)
GFR calc Af Amer: >60 mL/min (ref >60)
GFR calc non Af Amer: >60 mL/min (ref >60)
Glucose, Bld: 148 mg/dL — ABNORMAL HIGH (ref 70–99)
Potassium: 3.3 mmol/L — ABNORMAL LOW (ref 3.5–5.1)
Sodium: 126 mmol/L — ABNORMAL LOW (ref 135–145)
Total Bilirubin: 0.5 mg/dL (ref 0.3–1.2)
Total Protein: 5.4 g/dL — ABNORMAL LOW (ref 6.5–8.1)

## 2019-03-14 LAB — CBC WITH DIFFERENTIAL/PLATELET
Abs Immature Granulocytes: 0.08 10*3/uL — ABNORMAL HIGH (ref 0.00–0.07)
Basophils Absolute: 0 10*3/uL (ref 0.0–0.1)
Basophils Relative: 0 %
Eosinophils Absolute: 0 10*3/uL (ref 0.0–0.5)
Eosinophils Relative: 0 %
HCT: 30 % — ABNORMAL LOW (ref 36.0–46.0)
Hemoglobin: 10.4 g/dL — ABNORMAL LOW (ref 12.0–15.0)
Immature Granulocytes: 1 %
Lymphocytes Relative: 6 %
Lymphs Abs: 0.5 10*3/uL — ABNORMAL LOW (ref 0.7–4.0)
MCH: 30.8 pg (ref 26.0–34.0)
MCHC: 34.7 g/dL (ref 30.0–36.0)
MCV: 88.8 fL (ref 80.0–100.0)
Monocytes Absolute: 1.4 10*3/uL — ABNORMAL HIGH (ref 0.1–1.0)
Monocytes Relative: 17 %
Neutro Abs: 5.9 10*3/uL (ref 1.7–7.7)
Neutrophils Relative %: 76 %
Platelets: 214 10*3/uL (ref 150–400)
RBC: 3.38 MIL/uL — ABNORMAL LOW (ref 3.87–5.11)
RDW: 12 % (ref 11.5–15.5)
WBC: 7.8 10*3/uL (ref 4.0–10.5)
nRBC: 0 % (ref 0.0–0.2)

## 2019-03-14 LAB — ETHANOL: Alcohol, Ethyl (B): <10 mg/dL (ref <10)

## 2019-03-14 LAB — MAGNESIUM: Magnesium: 2 mg/dL (ref 1.7–2.4)

## 2019-03-14 LAB — T4, FREE: Free T4: 1.24 ng/dL — ABNORMAL HIGH (ref 0.61–1.12)

## 2019-03-14 LAB — AMMONIA: Ammonia: 44 umol/L — ABNORMAL HIGH (ref 9–35)

## 2019-03-14 LAB — LACTIC ACID, PLASMA
Lactic Acid, Venous: 2.3 mmol/L (ref 0.5–1.9)
Lactic Acid, Venous: 1.3 mmol/L (ref 0.5–1.9)

## 2019-03-14 LAB — APTT: aPTT: 32 seconds (ref 24–36)

## 2019-03-14 LAB — TSH: TSH: 0.181 u[IU]/mL — ABNORMAL LOW (ref 0.350–4.500)

## 2019-03-14 LAB — SARS CORONAVIRUS 2 (TAT 6-24 HRS): SARS Coronavirus 2: NEGATIVE

## 2019-03-14 MED ORDER — SODIUM CHLORIDE 0.9 % IV BOLUS
1000.0000 mL | Freq: Once | INTRAVENOUS | Status: AC
  Administered 2019-03-14: 1000 mL via INTRAVENOUS

## 2019-03-14 MED ORDER — POTASSIUM CHLORIDE IN NACL 20-0.9 MEQ/L-% IV SOLN
Freq: Once | INTRAVENOUS | Status: AC
  Administered 2019-03-14: 13:00:00 via INTRAVENOUS
  Filled 2019-03-14: qty 1000

## 2019-03-14 MED ORDER — LAMOTRIGINE 25 MG PO TABS
50.0000 mg | ORAL_TABLET | Freq: Two times a day (BID) | ORAL | Status: DC
  Administered 2019-03-14 – 2019-03-20 (×12): 50 mg via ORAL
  Filled 2019-03-14 (×12): qty 2

## 2019-03-14 MED ORDER — VANCOMYCIN HCL 10 G IV SOLR
1250.0000 mg | Freq: Once | INTRAVENOUS | Status: AC
  Administered 2019-03-14: 1250 mg via INTRAVENOUS
  Filled 2019-03-14: qty 1250

## 2019-03-14 MED ORDER — POTASSIUM CHLORIDE CRYS ER 20 MEQ PO TBCR: 40.0000 meq | EXTENDED_RELEASE_TABLET | Freq: Once | ORAL | Status: DC

## 2019-03-14 MED ORDER — LEVOTHYROXINE SODIUM 88 MCG PO TABS
88.0000 ug | ORAL_TABLET | Freq: Every day | ORAL | Status: DC
  Filled 2019-03-14: qty 1

## 2019-03-14 MED ORDER — LEVOFLOXACIN IN D5W 750 MG/150ML IV SOLN
750.0000 mg | Freq: Once | INTRAVENOUS | Status: AC
  Administered 2019-03-14: 13:00:00 750 mg via INTRAVENOUS
  Filled 2019-03-14: qty 150

## 2019-03-14 MED ORDER — SODIUM CHLORIDE 0.9 % IV SOLN
2.0000 g | Freq: Two times a day (BID) | INTRAVENOUS | Status: DC
  Administered 2019-03-15 – 2019-03-17 (×4): 2 g via INTRAVENOUS
  Filled 2019-03-14 (×6): qty 2

## 2019-03-14 MED ORDER — SODIUM CHLORIDE 0.9 % IV BOLUS
1000.0000 mL | Freq: Once | INTRAVENOUS | Status: AC
  Administered 2019-03-14: 13:00:00 1000 mL via INTRAVENOUS

## 2019-03-14 MED ORDER — SODIUM CHLORIDE 0.9 % IV SOLN
2.0000 g | Freq: Once | INTRAVENOUS | Status: AC
  Administered 2019-03-14: 16:00:00 2 g via INTRAVENOUS
  Filled 2019-03-14: qty 2

## 2019-03-14 MED ORDER — POTASSIUM CHLORIDE 10 MEQ/100ML IV SOLN: 10.0000 meq | INTRAVENOUS | Status: DC

## 2019-03-14 MED ORDER — SODIUM CHLORIDE 0.9 % IV SOLN
1000.0000 mL | INTRAVENOUS | Status: DC
  Administered 2019-03-14: 1000 mL via INTRAVENOUS

## 2019-03-14 MED ORDER — SODIUM CHLORIDE 0.9 % IV BOLUS (SEPSIS)
1000.0000 mL | Freq: Once | INTRAVENOUS | Status: AC
  Administered 2019-03-14: 1000 mL via INTRAVENOUS

## 2019-03-14 MED ORDER — SODIUM CHLORIDE 0.9 % IV SOLN
1000.0000 mL | INTRAVENOUS | Status: DC
  Administered 2019-03-15 – 2019-03-17 (×5): 1000 mL via INTRAVENOUS

## 2019-03-14 MED ORDER — LORAZEPAM 2 MG/ML IJ SOLN
0.5000 mg | Freq: Once | INTRAMUSCULAR | Status: AC
  Administered 2019-03-14: 0.5 mg via INTRAVENOUS
  Filled 2019-03-14: qty 1

## 2019-03-14 MED ORDER — VANCOMYCIN HCL IN DEXTROSE 1-5 GM/200ML-% IV SOLN
1000.0000 mg | INTRAVENOUS | Status: DC
  Administered 2019-03-15: 18:00:00 1000 mg via INTRAVENOUS
  Filled 2019-03-14: qty 200

## 2019-03-14 MED ORDER — DESVENLAFAXINE SUCCINATE ER 50 MG PO TB24
50.0000 mg | ORAL_TABLET | Freq: Every day | ORAL | Status: DC
  Administered 2019-03-15 – 2019-03-20 (×6): 50 mg via ORAL
  Filled 2019-03-14 (×6): qty 1

## 2019-03-14 MED ORDER — LAMOTRIGINE ER 100 MG PO TB24: 100.0000 mg | ORAL_TABLET | Freq: Every day | ORAL | Status: DC

## 2019-03-14 MED ORDER — DIVALPROEX SODIUM ER 500 MG PO TB24
500.0000 mg | ORAL_TABLET | Freq: Every day | ORAL | Status: DC
  Administered 2019-03-14 – 2019-03-20 (×7): 500 mg via ORAL
  Filled 2019-03-14 (×7): qty 1

## 2019-03-14 NOTE — ED Notes (Addendum)
Pt placed on purewick 

## 2019-03-14 NOTE — ED Notes (Signed)
Pt's sons updated on pt's status.

## 2019-03-14 NOTE — ED Notes (Signed)
Pt removed her pure-wick and urinated in bed.  Linens changed and pt informed to use call bell to inform when she needs to urinate for a urine sample.

## 2019-03-14 NOTE — H&P (Signed)
History and Physical  Shannon Barry [id number] DOB: 08-01-1950 DOA: 03/14/2019   Patient coming from: Home & is able to ambulate  Chief Complaint: Cough, fever, generalized weakness  HPI: Shannon PortelaLaura Barry is a 68 y.o. female with medical history significant for depression, seizures, hypothyroidism, TBI, was brought by EMS as patient have been having generalized weakness, cough, subjective fever/chills for the past couple of days.  Patient also noted to be more confused/agitated over the past couple of days as well.  EDP spoke to patient's son who noted patient has been more confused, overall very weak for the past 3 days.  Patient currently denies any new complaints, denies any chest pain, shortness of breath, abdominal pain, cough, fever/chills, diarrhea, nausea/vomiting.  Patient noted to be somewhat confused.  ED Course: Patient was afebrile, hypotensive which was responsive to IV fluids, currently saturating well on room air, lactic acid 2.3 on admission, sodium 126, hemoglobin of 10.4.  CT head done showed no acute infarct, postoperative change right posterior parietal region with mild encephalomalacia.  Chest x-ray showed confluent airspace consolidation in the left lower lung field concerning for pneumonia.  COVID-19 negative.  Patient admitted for further management.  Review of Systems: Review of systems are otherwise negative   Past Medical History:  Diagnosis Date   Allergy    Depression    Seizures (HCC)    Thyroid disease    Traumatic brain injury (HCC)    History reviewed. No pertinent surgical history.  Social History:  reports that she has never smoked. She has never used smokeless tobacco. She reports that she does not drink alcohol or use drugs.   Allergies  Allergen Reactions   Penicillins Hives    Has patient had a PCN reaction causing immediate rash, facial/tongue/throat swelling, SOB or lightheadedness with hypotension: No Has patient had a PCN reaction  causing severe rash involving mucus membranes or skin necrosis: Yes Has patient had a PCN reaction that required hospitalization No Has patient had a PCN reaction occurring within the last 10 years: Yes If all of the above answers are "NO", then may proceed with Cephalosporin use.    Sulfa Antibiotics Other (See Comments)    violently ill    History reviewed. No pertinent family history.    Prior to Admission medications   Medication Sig Start Date End Date Taking? Authorizing Provider  desvenlafaxine (PRISTIQ) 100 MG 24 hr tablet Take 50 mg by mouth daily.    Yes [provider]  divalproex (DEPAKOTE ER) 500 MG 24 hr tablet Take 1 tablet (500 mg total) by mouth daily. 07/12/18  Yes Lomax, Amy, NP  LamoTRIgine 100 MG TB24 24 hour tablet TAKE 1 TABLET BY MOUTH EVERYDAY AT BEDTIME Patient taking differently: Take 100 mg by mouth at bedtime.  07/12/18  Yes Lomax, Amy, NP  levothyroxine (SYNTHROID) 88 MCG tablet Take 88 mcg by mouth daily before breakfast.    Yes [provider]  traMADol (ULTRAM) 50 MG tablet Take 1 tablet (50 mg total) by mouth every 6 (six) hours as needed. Patient not taking: Reported on 03/14/2019 07/22/18   Janne NapoleonNeese, Hope M, NP    Physical Exam: BP 103/83    Pulse 74    Temp (!) 97.4 F (36.3 C) (Oral)    Resp (!) 24    Ht 5\' 3"  (1.6 m)    Wt 52.2 kg    SpO2 98%    BMI 20.37 kg/m   General: NAD, chronically ill-appearing, lethargic, confused Eyes: Normal ENT:  Normal Neck: Supple Cardiovascular: S1, S2 present Respiratory: Coarse breath sounds bilaterally, worse on the left Abdomen: Soft, nontender, nondistended, bowel sounds present Skin: Normal Musculoskeletal: No pedal edema noted bilaterally Psychiatric: Agitated Neurologic: No focal neurologic deficit noted          Labs on Admission:  Basic Metabolic Panel: Recent Labs  Lab 03/14/19 0951  NA 126*  K 3.3*  CL 94*  CO2 22  GLUCOSE 148*  BUN 9  CREATININE 0.53  CALCIUM 8.0*  MG  2.0   Liver Function Tests: Recent Labs  Lab 03/14/19 0951  AST 21  ALT 19  ALKPHOS 47  BILITOT 0.5  PROT 5.4*  ALBUMIN 2.3*   No results for input(s): LIPASE, AMYLASE in the last 168 hours. Recent Labs  Lab 03/14/19 0932  AMMONIA 44*   CBC: Recent Labs  Lab 03/14/19 0951  WBC 7.8  NEUTROABS 5.9  HGB 10.4*  HCT 30.0*  MCV 88.8  PLT 214   Cardiac Enzymes: No results for input(s): CKTOTAL, CKMB, CKMBINDEX, TROPONINI in the last 168 hours.  BNP (last 3 results) No results for input(s): BNP in the last 8760 hours.  ProBNP (last 3 results) No results for input(s): PROBNP in the last 8760 hours.  CBG: No results for input(s): GLUCAP in the last 168 hours.  Radiological Exams on Admission: Ct Head Wo Contrast  Result Date: 03/14/2019 CLINICAL DATA:  Altered mental status and fever EXAM: CT HEAD WITHOUT CONTRAST TECHNIQUE: Contiguous axial images were obtained from the base of the skull through the vertex without intravenous contrast. COMPARISON:  April 18, 2017 FINDINGS: Brain: There is mild supratentorial atrophy with moderate cerebellar atrophy, stable. There is no intracranial mass, hemorrhage, extra-axial fluid collection, or midline shift. There is stable mild posterior right parietal encephalomalacia. There is no demonstrable acute infarct. Vascular: No hyperdense vessel. There is no appreciable vascular calcification. Skull: Postoperative change in the right parietal bone is stable. Bony calvarium otherwise appears intact and unchanged. Sinuses/Orbits: There is mucosal thickening in several ethmoid air cells. Other visualized paranasal sinuses are clear. Visualized orbits appear symmetric bilaterally. Other: Mastoid air cells on the left are clear. There is opacification in several inferior mastoid air cells on the right. IMPRESSION: Stable atrophy with atrophy greatest in the cerebellum. Postoperative change right posterior parietal region with mild encephalomalacia  in this area. No acute infarct. Brain parenchyma otherwise appears unremarkable. No mass or hemorrhage. Mucosal thickening noted in several ethmoid air cells. Opacification in several inferior mastoid air cells on the right. Electronically Signed   By: Lowella Grip III M.D.   On: 03/14/2019 11:45   Dg Chest Port 1 View  Result Date: 03/14/2019 CLINICAL DATA:  Cough. Increasing weakness. EXAM: PORTABLE CHEST 1 VIEW COMPARISON:  December 28, 2015 FINDINGS: Cardiomediastinal silhouette is normal. Mediastinal contours appear intact. Confluent airspace consolidation in the left lower lung field. Chronic elevation of left hemidiaphragm. Concavity of the left chest wall, likely posttraumatic. Soft tissues are grossly normal. IMPRESSION: 1. Confluent airspace consolidation in the left lower lung field, concerning for pneumonia. 2. Chronic elevation of the left hemidiaphragm, likely posttraumatic. Electronically Signed   By: Fidela Salisbury M.D.   On: 03/14/2019 09:56    EKG: Pending  Assessment/Plan Present on Admission:  Acute metabolic encephalopathy  Post-traumatic seizures (HCC)  TBI (traumatic brain injury) ([place])  Dementia with behavioral disturbance (HCC)  Mixed bipolar I disorder ([place])  History of alcohol abuse  Alzheimer's disease ([place])  Principal Problem:   Acute  metabolic encephalopathy Active Problems:   Dementia with behavioral disturbance (HCC)   Post-traumatic seizures (HCC)   TBI (traumatic brain injury) (HCC)   Mixed bipolar I disorder (HCC)   History of alcohol abuse   Alzheimer's disease (HCC)   Acute metabolic encephalopathy likely 2/2 sepsis 2/2 CAP Currently afebrile, no leukocytosis Hypotensive on admission, with lactic acidosis Lactic acid 2.3--> 1.3 BC x2 pending UA pending collection CT head done showed no acute infarct, postoperative change right posterior parietal region with mild encephalomalacia Chest x-ray showed confluent airspace consolidation  in the left lower lung field concerning for pneumonia Continue cefepime, metronidazole, vancomycin, plan to de-escalate as patient clinically improved IV fluids Duo nebs as needed, supplemental O2 as needed  Hyponatremia Likely 2/2 poor oral intake IV fluids Daily BMP  Normocytic anemia Last baseline hemoglobin 13.6, on admission 10.4 No obvious signs of bleeding Anemia panel pending Daily CBC  Hypothyroidism TSH 0.181, free T4 pending Continue home Synthroid, may need adjustment  Depression/seizure Continue home lamotrigine, Depakote, Pristiq     DVT prophylaxis: Lovenox  Code Status: Full  Family Communication: None at bedside  Disposition Plan: To be determined  Consults called: None  Admission status: Inpatient    Briant Cedar MD [facility]   If 7PM-7AM, please contact night-coverage [email or web address]   03/14/2019, 6:45 PM

## 2019-03-14 NOTE — Progress Notes (Signed)
Pharmacy Antibiotic Note  Shannon Barry is a 68 y.o. female admitted on 03/14/2019 with sepsis.  Pharmacy has been consulted for vanc/cefepime dosing.  Plan: 1) Vanc 1250mg  IV x 1 then 1g IV q24 - goal AUC 400-550 2) Aztreonam ordered per Md but patient has listed allergy to PCN of hives and has tolerated Rocephin in the past, so per policy will change aztreonam to Cefepime 2g IV q12 per current CrCl   Height: 5\' 3"  (160 cm) Weight: 115 lb (52.2 kg) IBW/kg (Calculated) : 52.4  Temp (24hrs), Avg:97.7 F (36.5 C), Min:97.4 F (36.3 C), Max:98 F (36.7 C)  Recent Labs  Lab 03/14/19 0904 03/14/19 0951  WBC  --  7.8  CREATININE  --  0.53  LATICACIDVEN 2.3*  --     Estimated Creatinine Clearance: 57 mL/min (by C-G formula based on SCr of 0.53 mg/dL).    Allergies  Allergen Reactions  . Penicillins Hives    Has patient had a PCN reaction causing immediate rash, facial/tongue/throat swelling, SOB or lightheadedness with hypotension: No Has patient had a PCN reaction causing severe rash involving mucus membranes or skin necrosis: Yes Has patient had a PCN reaction that required hospitalization No Has patient had a PCN reaction occurring within the last 10 years: Yes If all of the above answers are "NO", then may proceed with Cephalosporin use.   . Sulfa Antibiotics Other (See Comments)    violently ill     Thank you for allowing pharmacy to be a part of this patient's care.  Kara Mead 03/14/2019 2:55 PM

## 2019-03-14 NOTE — ED Notes (Signed)
Pt has become increasingly anxious and is agitated.  Pt also is confused, saying statements that don't make sense.

## 2019-03-14 NOTE — ED Notes (Signed)
X ray in room.

## 2019-03-14 NOTE — ED Triage Notes (Signed)
Per EMS: Pt has had gradual increase of weakness the past 2 weeks.  Pt states the past 3 days she has only been able to walk to the bathroom.  Pt has had some coughing with activity. 101.1 temp  CBG 158 109/55 94 pulse 96% RA 20 L wrist 300 cc NS 1g tylenol

## 2019-03-14 NOTE — ED Notes (Signed)
Pt urinate in bed.  Pts call bell in bed with pt.  Pt Linens changed.

## 2019-03-14 NOTE — ED Notes (Signed)
Patient transported to CT 

## 2019-03-14 NOTE — ED Provider Notes (Signed)
[facility]-EMERGENCY DEPT Provider Note   CSN: [id number] Arrival date & time: 03/14/19  0820     History   Chief Complaint Chief Complaint  Patient presents with   Weakness   Cough   Fever      HPI  Blood pressure 111/60, pulse 88, temperature 98 F (36.7 C), temperature source Oral, resp. rate 16, height 5\' 3"  (1.6 m), weight 52.2 kg, SpO2 94 %.  Shannon PortelaLaura Barry is a 68 y.o. female with past medical history significant for depression, seizures, thyroid disease and traumatic brain injury brought in by EMS.  Per EMS patient has had a gradual increase in weakness over the course of the last 2 weeks and having difficulty walking, states that she can only walk to the bathroom she is also been having some coughing with activity.  Per EMS patient had a temperature of 100.1, CBG 158, normal vital signs otherwise, Tylenol given in route.  Per patient she feels fine and is unsure why she is in the emergency department.  She states that she is not a heavy drinker she denies cough, fever chills, headache, nausea vomiting diarrhea.  She does not have a PCP.  Per chart review a person claiming to be her son call the family practice clinic yesterday and was complaining of strokelike symptoms with inability to walk and they advised her to go to the emergency department, the person that called the family practice clinic did not give any name or number for return call.   Shannon Barry; 2 days ago physically and mentally unstable, sense of balance lost falling groggy . 5 days ago fine and walking her dog. 2 months weight loss. 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   History of alcohol abuse 11/10/2015   Alzheimer's disease (HCC) 11/10/2015   Mixed bipolar I disorder (HCC) 09/24/2015   Alcohol abuse 09/24/2015   OCD (obsessive compulsive disorder) 09/24/2015   Hyponatremia 07/11/2015   Chest pain 07/11/2015   Nausea and vomiting 07/11/2015   Seizures (HCC) 07/11/2015   Vertigo    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   OB History   No obstetric history on file.      Home Medications    Prior to Admission medications   Medication Sig Start Date End Date Taking? Authorizing Provider  Calcium Carbonate-Vitamin D (CALCIUM-VITAMIN D) 600-125 MG-UNIT TABS Take 1 tablet by mouth daily.    [provider]  desvenlafaxine (PRISTIQ) 100 MG 24 hr tablet Take 50 mg by mouth daily.     [provider]  divalproex (DEPAKOTE ER) 500 MG 24 hr tablet Take 1 tablet (500 mg total) by mouth daily. 07/12/18   Lomax, Amy, NP  docusate sodium (COLACE) 100  MG capsule Take 100 mg by mouth 2 (two) times daily.    [provider]  ibuprofen (ADVIL,MOTRIN) 200 MG tablet Take 200 mg by mouth 2 (two) times daily.     [provider]  LamoTRIgine 100 MG TB24 24 hour tablet TAKE 1 TABLET BY MOUTH EVERYDAY AT BEDTIME 07/12/18   Lomax, Amy, NP  levothyroxine (SYNTHROID, LEVOTHROID) 100 MCG tablet Take 100 mcg by mouth daily before breakfast.    [provider]  meclizine (ANTIVERT) 12.5 MG tablet Take 12.5 mg by mouth 3 (three) times daily as needed for dizziness.    [provider]  traMADol (ULTRAM) 50 MG tablet Take 1 tablet (50 mg total) by mouth every 6 (six) hours as needed. 07/22/18   [name], NP    Family History No family history on file.  Social History Social History   Tobacco Use   Smoking status: Never Smoker   Smokeless tobacco: Never Used  Substance Use Topics   Alcohol use: No    Alcohol/week: 0.0 standard drinks   Drug use: No     Allergies   Penicillins and Sulfa antibiotics   Review of Systems Review of Systems  A complete review of systems was obtained and all systems are negative except as noted in the HPI and PMH.   Physical Exam Updated Vital Signs BP (!) 97/57    Pulse 71    Temp (!) 97.4 F (36.3 C) (Oral)    Resp 17    Ht  (1.6 m)    Wt 52.2 kg    SpO2 90%    BMI 20.37 kg/m   Physical Exam Vitals signs and nursing note reviewed.  Constitutional:      General: She is not in acute distress.    Appearance: She is well-developed. She is not diaphoretic.  HENT:     Head: Normocephalic and atraumatic.  Eyes:     Conjunctiva/sclera: Conjunctivae normal.     Pupils: Pupils are equal, round, and reactive to light.  Neck:     Musculoskeletal: Normal range of motion.     Comments: FROM to C-spine. Pt can touch chin to chest without discomfort. No TTP of midline cervical spine.  Cardiovascular:     Rate and Rhythm: Normal rate and regular rhythm.     Pulses: Normal pulses.  Pulmonary:     Effort: Pulmonary effort is normal.     Breath sounds: Normal breath sounds.  Abdominal:     General: Abdomen is flat. Bowel sounds are normal.     Palpations: Abdomen is soft.     Tenderness: There is no abdominal tenderness.  Musculoskeletal: Normal range of motion.        General: No swelling, tenderness, deformity or signs of injury.  Neurological:     General: No focal deficit present.     Mental Status: She is alert and oriented to person, place, and time.      ED Treatments / Results   Labs (all labs ordered are listed, but only abnormal results are displayed) Labs Reviewed  LACTIC ACID, PLASMA - Abnormal; Notable for the following components:      Result Value   Lactic Acid, Venous 2.3 (*)    All other components within normal limits  COMPREHENSIVE METABOLIC PANEL - Abnormal; Notable for the following components:   Sodium 126 (*)    Potassium 3.3 (*)    Chloride 94 (*)    Glucose, Bld 148 (*)  Calcium 8.0 (*)    Total Protein 5.4 (*)    Albumin 2.3 (*)    All other components within normal limits  CBC WITH DIFFERENTIAL/PLATELET - Abnormal; Notable for the following components:   RBC 3.38 (*)    Hemoglobin 10.4 (*)    HCT 30.0 (*)    Lymphs Abs 0.5 (*)    Monocytes Absolute 1.4 (*)    Abs Immature Granulocytes 0.08 (*)    All other components within normal limits  AMMONIA - Abnormal; Notable for the following components:   Ammonia 44 (*)    All other components within normal limits  TSH - Abnormal; Notable for the following components:   TSH 0.181 (*)    All other components within normal limits  CULTURE, BLOOD (ROUTINE X 2)  CULTURE, BLOOD (ROUTINE X 2)  URINE CULTURE  SARS CORONAVIRUS 2 (TAT 6-24 HRS)  APTT  MAGNESIUM  ETHANOL  LACTIC ACID, PLASMA  URINALYSIS, ROUTINE W REFLEX MICROSCOPIC  RAPID URINE DRUG SCREEN, HOSP PERFORMED    EKG None  Radiology Ct Head Wo Contrast  Result Date: 03/14/2019 CLINICAL DATA:  Altered mental status and fever EXAM: CT HEAD WITHOUT CONTRAST TECHNIQUE: Contiguous axial images were obtained from the base of the skull through the vertex without intravenous contrast. COMPARISON:  April 18, 2017 FINDINGS: Brain: There is mild supratentorial atrophy with moderate cerebellar atrophy, stable. There is no intracranial mass, hemorrhage, extra-axial fluid collection, or midline shift. There is stable mild posterior right parietal encephalomalacia. There is no demonstrable acute infarct. Vascular: No hyperdense vessel.  There is no appreciable vascular calcification. Skull: Postoperative change in the right parietal bone is stable. Bony calvarium otherwise appears intact and unchanged. Sinuses/Orbits: There is mucosal thickening in several ethmoid air cells. Other visualized paranasal sinuses are clear. Visualized orbits appear symmetric bilaterally. Other: Mastoid air cells on the left are clear. There is opacification in several inferior mastoid air cells on the right. IMPRESSION: Stable atrophy with atrophy greatest in the cerebellum. Postoperative change right posterior parietal region with mild encephalomalacia in this area. No acute infarct. Brain parenchyma otherwise appears unremarkable. No mass or hemorrhage. Mucosal thickening noted in several ethmoid air cells. Opacification in several inferior mastoid air cells on the right. Electronically Signed   By: Lowella Grip III M.D.   On: 03/14/2019 11:45   Dg Chest Port 1 View  Result Date: 03/14/2019 CLINICAL DATA:  Cough. Increasing weakness. EXAM: PORTABLE CHEST 1 VIEW COMPARISON:  December 28, 2015 FINDINGS: Cardiomediastinal silhouette is normal. Mediastinal contours appear intact. Confluent airspace consolidation in the left lower lung field. Chronic elevation of left hemidiaphragm. Concavity of the left chest wall, likely posttraumatic. Soft tissues are grossly normal. IMPRESSION: 1. Confluent airspace consolidation in the left lower lung field, concerning for pneumonia. 2. Chronic elevation of the left hemidiaphragm, likely posttraumatic. Electronically Signed   By: Fidela Salisbury M.D.   On: 03/14/2019 09:56    Procedures Procedures (including critical care time)  Medications Ordered in ED Medications  0.9 %  sodium chloride infusion (1,000 mLs Intravenous New Bag/Given 03/14/19 1024)  levofloxacin (LEVAQUIN) IVPB 750 mg (750 mg Intravenous New Bag/Given 03/14/19 1304)  sodium chloride 0.9 % bolus 1,000 mL (0 mLs Intravenous Stopped 03/14/19 1214)   sodium chloride 0.9 % bolus 1,000 mL (1,000 mLs Intravenous New Bag/Given 03/14/19 1302)  sodium chloride 0.9 % bolus 1,000 mL (1,000 mLs Intravenous New Bag/Given 03/14/19 1214)  0.9 % NaCl with KCl 20 mEq/ L  infusion ( Intravenous  New Bag/Given 03/14/19 1306)   CRITICAL CARE Performed by: Wynetta Emery   Total critical care time: 35 minutes  Critical care time was exclusive of separately billable procedures and treating other patients.  Critical care was necessary to treat or prevent imminent or life-threatening deterioration.  Critical care was time spent personally by me on the following activities: development of treatment plan with patient and/or surrogate as well as nursing, discussions with consultants, evaluation of patient's response to treatment, examination of patient, obtaining history from patient or surrogate, ordering and performing treatments and interventions, ordering and review of laboratory studies, ordering and review of radiographic studies, pulse oximetry and re-evaluation of patient's condition.   Initial Impression / Assessment and Plan / ED Course  I have reviewed the triage vital signs and the nursing notes.  Pertinent labs & imaging results that were available during my care of the patient were reviewed by me and considered in my medical decision making (see chart for details).        Vitals:   03/14/19 1200 03/14/19 1230 03/14/19 1300 03/14/19 1340  BP: (!) 85/53 (!) 93/54 (!) 97/57   Pulse: 66 69 71   Resp: 16 17 17    Temp:    (!) 97.4 F (36.3 C)  TempSrc:    Oral  SpO2: 97% 96% 90%   Weight:      Height:        Medications  0.9 %  sodium chloride infusion (1,000 mLs Intravenous New Bag/Given 03/14/19 1024)  levofloxacin (LEVAQUIN) IVPB 750 mg (750 mg Intravenous New Bag/Given 03/14/19 1304)  sodium chloride 0.9 % bolus 1,000 mL (0 mLs Intravenous Stopped 03/14/19 1214)  sodium chloride 0.9 % bolus 1,000 mL (1,000 mLs Intravenous New  Bag/Given 03/14/19 1302)  sodium chloride 0.9 % bolus 1,000 mL (1,000 mLs Intravenous New Bag/Given 03/14/19 1214)  0.9 % NaCl with KCl 20 mEq/ L  infusion ( Intravenous New Bag/Given 03/14/19 1306)    Keerthi Hazell is 68 y.o. female presenting with by EMS with generalized weakness and fever, there was some cough reported per her son but she is not endorsing this.  She has a generalized weakness but she states that she is in no pain, she did not realize she was febrile.  Per her son 72 she was in her normal state of health, active and alert 1 week ago when she is becoming progressively more weak to the point where she has not gotten out of bed in 3 days and her son had to help her to go to the bathroom which is atypical for her.  Chart review shows that son called a family practice and was concerned about her weakness they advised her to go to the emergency department yesterday but she did not go.  Patient with reassuring vital signs, grossly nonfocal neurologic exam.  Chest x-ray is consistent with a lobar pneumonia.  We will start her on Levaquin.  Lactate has resulted at 2.3, her blood pressure has decompensated to systolic of 80 after receiving 1 L of fluid, will put her in for a second liter of fluid.  Her chemistry shows a hyponatremia in addition to a hypokalemia of 3.3.  Blood pressure improved to systolic of 97/57.   Updated son Casimiro Needle on condition and disposition    Final Clinical Impressions(s) / ED Diagnoses   Final diagnoses:  Community acquired pneumonia of left lung, unspecified part of lung  Hyponatremia  Hypotension, unspecified hypotension type      Harbor Paster,  Joni Reining, PA-C 03/14/19 1352    Geoffery Lyons, MD 03/14/19 [phone]

## 2019-03-15 ENCOUNTER — Other Ambulatory Visit: Payer: Self-pay

## 2019-03-15 DIAGNOSIS — J189 Pneumonia, unspecified organism: Secondary | ICD-10-CM | POA: Diagnosis present

## 2019-03-15 LAB — IRON AND TIBC
Iron: 15 ug/dL — ABNORMAL LOW (ref 28–170)
Saturation Ratios: 8 % — ABNORMAL LOW (ref 10.4–31.8)
TIBC: 195 ug/dL — ABNORMAL LOW (ref 250–450)
UIBC: 180 ug/dL

## 2019-03-15 LAB — VITAMIN B12: Vitamin B-12: 242 pg/mL (ref 180–914)

## 2019-03-15 LAB — BASIC METABOLIC PANEL
Anion gap: 8 (ref 5–15)
BUN: 10 mg/dL (ref 8–23)
CO2: 21 mmol/L — ABNORMAL LOW (ref 22–32)
Calcium: 8.2 mg/dL — ABNORMAL LOW (ref 8.9–10.3)
Chloride: 108 mmol/L (ref 98–111)
Creatinine, Ser: 0.49 mg/dL (ref 0.44–1.00)
GFR calc Af Amer: >60 mL/min (ref >60)
GFR calc non Af Amer: >60 mL/min (ref >60)
Glucose, Bld: 135 mg/dL — ABNORMAL HIGH (ref 70–99)
Potassium: 3.9 mmol/L (ref 3.5–5.1)
Sodium: 137 mmol/L (ref 135–145)

## 2019-03-15 LAB — URINALYSIS, ROUTINE W REFLEX MICROSCOPIC
Bilirubin Urine: NEGATIVE
Glucose, UA: NEGATIVE mg/dL
Hgb urine dipstick: NEGATIVE
Ketones, ur: 20 mg/dL — AB
Nitrite: NEGATIVE
Protein, ur: 30 mg/dL — AB
Specific Gravity, Urine: 1.018 (ref 1.005–1.030)
pH: 6 (ref 5.0–8.0)

## 2019-03-15 LAB — RAPID URINE DRUG SCREEN, HOSP PERFORMED
Amphetamines: NOT DETECTED
Barbiturates: NOT DETECTED
Benzodiazepines: NOT DETECTED
Cocaine: NOT DETECTED
Opiates: POSITIVE — AB
Tetrahydrocannabinol: NOT DETECTED

## 2019-03-15 LAB — CBC
HCT: 31.7 % — ABNORMAL LOW (ref 36.0–46.0)
Hemoglobin: 10.6 g/dL — ABNORMAL LOW (ref 12.0–15.0)
MCH: 30.5 pg (ref 26.0–34.0)
MCHC: 33.4 g/dL (ref 30.0–36.0)
MCV: 91.1 fL (ref 80.0–100.0)
Platelets: 253 10*3/uL (ref 150–400)
RBC: 3.48 MIL/uL — ABNORMAL LOW (ref 3.87–5.11)
RDW: 12.7 % (ref 11.5–15.5)
WBC: 7.6 10*3/uL (ref 4.0–10.5)
nRBC: 0 % (ref 0.0–0.2)

## 2019-03-15 LAB — FOLATE: Folate: 14.8 ng/mL (ref >5.9)

## 2019-03-15 LAB — FERRITIN: Ferritin: 187 ng/mL (ref 11–307)

## 2019-03-15 LAB — HIV ANTIBODY (ROUTINE TESTING W REFLEX): HIV Screen 4th Generation wRfx: NONREACTIVE

## 2019-03-15 MED ORDER — ACETAMINOPHEN 325 MG PO TABS: 650.0000 mg | ORAL_TABLET | Freq: Four times a day (QID) | ORAL | Status: DC | PRN

## 2019-03-15 MED ORDER — ACETAMINOPHEN 650 MG RE SUPP: 650.0000 mg | Freq: Four times a day (QID) | RECTAL | Status: DC | PRN

## 2019-03-15 MED ORDER — ALBUTEROL SULFATE (2.5 MG/3ML) 0.083% IN NEBU: 2.5000 mg | INHALATION_SOLUTION | RESPIRATORY_TRACT | Status: DC | PRN

## 2019-03-15 MED ORDER — ENOXAPARIN SODIUM 40 MG/0.4ML ~~LOC~~ SOLN
40.0000 mg | SUBCUTANEOUS | Status: DC
  Administered 2019-03-15 – 2019-03-20 (×5): 40 mg via SUBCUTANEOUS
  Filled 2019-03-15 (×6): qty 0.4

## 2019-03-15 MED ORDER — LEVOTHYROXINE SODIUM 50 MCG PO TABS
50.0000 ug | ORAL_TABLET | Freq: Every day | ORAL | Status: DC
  Administered 2019-03-16 – 2019-03-20 (×5): 50 ug via ORAL
  Filled 2019-03-15 (×5): qty 1

## 2019-03-15 MED ORDER — POLYETHYLENE GLYCOL 3350 17 G PO PACK: 17.0000 g | PACK | Freq: Every day | ORAL | Status: DC | PRN

## 2019-03-15 MED ORDER — ONDANSETRON HCL 4 MG PO TABS: 4.0000 mg | ORAL_TABLET | Freq: Four times a day (QID) | ORAL | Status: DC | PRN

## 2019-03-15 MED ORDER — ONDANSETRON HCL 4 MG/2ML IJ SOLN: 4.0000 mg | Freq: Four times a day (QID) | INTRAMUSCULAR | Status: DC | PRN

## 2019-03-15 NOTE — ED Notes (Signed)
Hospital MD at bedside

## 2019-03-15 NOTE — Progress Notes (Signed)
Called to get report. Secretary will have RN call me back.

## 2019-03-15 NOTE — Evaluation (Signed)
Clinical/Bedside Swallow Evaluation Patient Details  Name: Shannon Barry MRN: [id number] Date of Birth: 07/14/1950  Today's Date: 03/15/2019 Time: SLP Start Time (ACUTE ONLY): 1815 SLP Stop Time (ACUTE ONLY): 1840 SLP Time Calculation (min) (ACUTE ONLY): 25 min  Past Medical History:  Past Medical History:  Diagnosis Date  . Allergy   . Depression   . Seizures ([place])   . Thyroid disease   . Traumatic brain injury [facility])    Past Surgical History: History reviewed. No pertinent surgical history. HPI:  68 yo female adm to [facility] with AMS, confusion- sepsis with concern for aspiration.  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   Assessment / Plan / Recommendation Clinical Impression  No overt indication of aspiration with all po intake.  Delayed throat clearing noted across all boluses - which pt states is normal. Voice is normal and clear throughout all po.   No oral retention of boluses and patient can feed herself fortunately.  Recommend pt continue diet as tolerated.  Will follow up to assure tolerance and for instrumental testing if indicated.  Question some possible esophageal involvement given h/o ETOH use. Pt reports weight loss since her spouse left her as she has no appetite. SLP Visit Diagnosis: Dysphagia, unspecified (R13.10)    Aspiration Risk  Mild aspiration risk    Diet Recommendation Regular   Liquid Administration via: Straw;Cup Medication Administration: Whole meds with liquid(as tolerated) Supervision: Patient able to self feed Compensations: Slow rate;Small sips/bites    Other  Recommendations Oral Care Recommendations: Oral care BID   Follow up Recommendations None      Frequency and Duration min 1 x/week  1 week       Prognosis Prognosis for Safe Diet Advancement: Fair Barriers to Reach Goals: Cognitive deficits      Swallow Study   General  Date of Onset: 03/15/19 HPI: 68 yo female adm to [facility] with AMS, confusion- sepsis with concern for aspiration.  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 Type of Study: Bedside Swallow Evaluation Diet Prior to this Study: Regular;Thin liquids Temperature Spikes Noted: No Respiratory Status: Room air History of Recent Intubation: No Behavior/Cognition: Alert;Cooperative;Pleasant mood Oral Cavity Assessment: Within Functional Limits Oral Care Completed by SLP: No Oral Cavity - Dentition: Adequate natural dentition Vision: Functional for self-feeding Self-Feeding Abilities: Able to feed self Patient Positioning: Upright in bed Baseline Vocal Quality: Normal Volitional Cough: Other (Comment) Volitional Swallow: Unable to elicit    Oral/Motor/Sensory Function Overall Oral Motor/Sensory Function: Other (comment)(pt with general weakness)   Ice Chips Ice chips: Not tested   Thin Liquid Thin Liquid: Impaired Presentation: Straw;Self Fed Pharyngeal  Phase Impairments: Throat Clearing - Delayed Other Comments: subtle throat clearing across approximately 70% of boluses consumed    Nectar Thick Nectar Thick Liquid: Impaired Presentation: Straw;Self Fed Other Comments: subtle throat clearing across approximately 70% of boluses consumed   Honey Thick Honey Thick Liquid: Not tested   Puree Puree: Impaired Presentation: Self Fed;Spoon Pharyngeal Phase Impairments: Throat Clearing - Delayed Other Comments: delayed throat clearing   Solid     Solid: Within functional limits Presentation: Self Fredirick Lathe 03/15/2019,6:43 PM  [name], [place] SLP [place] Pager [phone] Office [phone]

## 2019-03-15 NOTE — Progress Notes (Signed)
PROGRESS NOTE    Shannon Barry  ZOX:[id number] DOB: [birth year] DOA: 03/14/2019 PCP: Florentina Jenny, MD    Brief Narrative:  Shannon Barry is a 68 y.o. female with medical history significant for depression, seizures, hypothyroidism, TBI, was brought by EMS as patient have been having generalized weakness, cough, subjective fever/chills for the past couple of days.  Patient also noted to be more confused/agitated over the past couple of days as well.  EDP spoke to patient's son who noted patient has been more confused, overall very weak for the past 3 days.  Patient currently denies any new complaints, denies any chest pain, shortness of breath, abdominal pain, cough, fever/chills, diarrhea, nausea/vomiting.  Patient noted to be somewhat confused. In emergency department, Patient was afebrile, hypotensive which was responsive to IV fluids, currently saturating well on room air, lactic acid 2.3 on admission, sodium 126, hemoglobin of 10.4.  CT head done showed no acute infarct, postoperative change right posterior parietal region with mild encephalomalacia.  Chest x-ray showed confluent airspace consolidation in the left lower lung field concerning for pneumonia.  COVID-19 negative.  Patient admitted for further management.   Assessment & Plan:   Principal Problem:   Acute metabolic encephalopathy Active Problems:   Dementia with behavioral disturbance (HCC)   Post-traumatic seizures (HCC)   TBI (traumatic brain injury) (HCC)   Mixed bipolar I disorder (HCC)   History of alcohol abuse   Alzheimer's disease (HCC)   Pneumonia  Sepsis present on admission due to left lower lobe pneumonia: Bacterial pneumonia suspect aspiration.  Hemodynamically stabilizing.  Blood cultures and urine cultures negative so far.  Unable to produce sputum.  COVID-19 negative. Penicillin allergy. On vancomycin and cefepime, continue for the next 24 hours. Cough and deep breathing exercises, incentive spirometry,  however doubt patient will cooperate. Speech therapy evaluation.  Acute metabolic encephalopathy in the setting of traumatic brain injury and posttraumatic seizure disorders: Metabolic infective encephalopathy.  No focal deficit.  CT head normal. Continue close monitoring.  Delirium precautions.  Fall precautions. Patient is on multiple seizure medication and mood stabilizer including Pristiq, Depakote, lamotrigine that she will continue.  Hypochloremic hyponatremia: Resuscitated with isotonic saline with improvement.  Hypothyroidism: TSH is suppressed to less than 0.2.  Will decrease thyroxine to 50 mcg daily.   DVT prophylaxis: Lovenox subcu Code Status: Full code Family Communication: Patient's son [name], ex-husband who is a physician on the conference call. Disposition Plan: Home with home health versus SNF.  Lives with son Shannon Barry.   Consultants:   None  Procedures:   None  Antimicrobials:   Vancomycin, 03/14/2019--  Cefepime, 03/14/2019   Subjective: Patient seen and examined.  Patient is a still in the emergency room since yesterday because of unavailability of inpatient beds.  Seen with the nurses bedside.  No overnight events.  She remains confused and irritable.  Afebrile.  Objective: Vitals:   03/15/19 1200 03/15/19 1224 03/15/19 1301 03/15/19 1430  BP: 124/65 124/65 112/66 121/69  Pulse:  78  81  Resp:  17  18  Temp:    97.9 F (36.6 C)  TempSrc:    Oral  SpO2:  98%  98%  Weight:      Height:        Intake/Output Summary (Last 24 hours) at 03/15/2019 1436 Last data filed at 03/15/2019 1435 Gross per 24 hour  Intake 4587.26 ml  Output --  Net 4587.26 ml   Filed Weights   03/14/19 0844  Weight: 52.2 kg  Examination:  General exam: Appears intermittently agitated, confused and irritable.  On room air. Respiratory system: Clear to auscultation. Respiratory effort normal.  No added sounds. Cardiovascular system: S1 & S2 heard, RRR. No JVD,  murmurs, rubs, gallops or clicks. No pedal edema. Gastrointestinal system: Abdomen is nondistended, soft and nontender. No organomegaly or masses felt. Normal bowel sounds heard. Central nervous system: Alert but not oriented.  No focal neurological deficits. Extremities: Symmetric 5 x 5 power. Skin: No rashes, lesions or ulcers Psychiatry: Judgement and insight appear compromised.  Mood & affect anxious and irritable.    Data Reviewed: I have personally reviewed following labs and imaging studies  CBC: Recent Labs  Lab 03/14/19 0951 03/15/19 0549  WBC 7.8 7.6  NEUTROABS 5.9  --   HGB 10.4* 10.6*  HCT 30.0* 31.7*  MCV 88.8 91.1  PLT 214 253   Basic Metabolic Panel: Recent Labs  Lab 03/14/19 0951 03/15/19 0549  NA 126* 137  K 3.3* 3.9  CL 94* 108  CO2 22 21*  GLUCOSE 148* 135*  BUN 9 10  CREATININE 0.53 0.49  CALCIUM 8.0* 8.2*  MG 2.0  --    GFR: Estimated Creatinine Clearance: 57 mL/min (by C-G formula based on SCr of 0.49 mg/dL). Liver Function Tests: Recent Labs  Lab 03/14/19 0951  AST 21  ALT 19  ALKPHOS 47  BILITOT 0.5  PROT 5.4*  ALBUMIN 2.3*   No results for input(s): LIPASE, AMYLASE in the last 168 hours. Recent Labs  Lab 03/14/19 0932  AMMONIA 44*   Coagulation Profile: No results for input(s): INR, PROTIME in the last 168 hours. Cardiac Enzymes: No results for input(s): CKTOTAL, CKMB, CKMBINDEX, TROPONINI in the last 168 hours. BNP (last 3 results) No results for input(s): PROBNP in the last 8760 hours. HbA1C: No results for input(s): HGBA1C in the last 72 hours. CBG: No results for input(s): GLUCAP in the last 168 hours. Lipid Profile: No results for input(s): CHOL, HDL, LDLCALC, TRIG, CHOLHDL, LDLDIRECT in the last 72 hours. Thyroid Function Tests: Recent Labs    03/14/19 0951 03/14/19 1000  TSH 0.181*  --   FREET4  --  1.24*   Anemia Panel: Recent Labs    03/15/19 0549  VITAMINB12 242  FOLATE 14.8  FERRITIN 187  TIBC 195*   IRON 15*   Sepsis Labs: Recent Labs  Lab 03/14/19 0904 03/14/19 1652  LATICACIDVEN 2.3* 1.3    Recent Results (from the past 240 hour(s))  SARS CORONAVIRUS 2 (TAT 6-24 HRS) Nasopharyngeal Nasopharyngeal Swab     Status: None   Collection Time: 03/14/19  9:48 AM   Specimen: Nasopharyngeal Swab  Result Value Ref Range Status   SARS Coronavirus 2 NEGATIVE NEGATIVE Final    Comment: (NOTE) SARS-CoV-2 target nucleic acids are NOT DETECTED. The SARS-CoV-2 RNA is generally detectable in upper and lower respiratory specimens during the acute phase of infection. Negative results do not preclude SARS-CoV-2 infection, do not rule out co-infections with other pathogens, and should not be used as the sole basis for treatment or other patient management decisions. Negative results must be combined with clinical observations, patient history, and epidemiological information. The expected result is Negative. Fact Sheet for Patients: HairSlick.no Fact Sheet for Healthcare Providers: quierodirigir.com This test is not yet approved or cleared by the [place] FDA and  has been authorized for detection and/or diagnosis of SARS-CoV-2 by FDA under an Emergency Use Authorization (EUA). This EUA will remain  in effect (meaning this test  can be used) for the duration of the COVID-19 declaration under Section 56 4(b)(1) of the Act, 21 U.S.C. section 360bbb-3(b)(1), unless the authorization is terminated or revoked sooner. Performed at [facility], [address] [place], [place] [address]   Blood Culture (routine x 2)     Status: None (Preliminary result)   Collection Time: 03/14/19  9:51 AM   Specimen: BLOOD LEFT FOREARM  Result Value Ref Range Status   Specimen Description   Final    BLOOD LEFT FOREARM Performed at [facility], [address] [place], [place] [address]    Special Requests   Final    BOTTLES DRAWN  AEROBIC AND ANAEROBIC Blood Culture adequate volume Performed at [facility], [address] [place], [place] [address]    Culture   Final    NO GROWTH < 24 HOURS Performed at [facility], [address] [place], [place] [address]    Report Status PENDING  Incomplete  Blood Culture (routine x 2)     Status: None (Preliminary result)   Collection Time: 03/14/19  9:52 AM   Specimen: BLOOD RIGHT HAND  Result Value Ref Range Status   Specimen Description   Final    BLOOD RIGHT HAND Performed at [facility], [address] [place], [place] [address]    Special Requests   Final    BOTTLES DRAWN AEROBIC AND ANAEROBIC Blood Culture results may not be optimal due to an excessive volume of blood received in culture bottles Performed at [facility], [address] [place], [place] [address]    Culture   Final    NO GROWTH < 24 HOURS Performed at [facility], [address] [place], [place] [address]    Report Status PENDING  Incomplete         Radiology Studies: Ct Head Wo Contrast  Result Date: 03/14/2019 CLINICAL DATA:  Altered mental status and fever EXAM: CT HEAD WITHOUT CONTRAST TECHNIQUE: Contiguous axial images were obtained from the base of the skull through the vertex without intravenous contrast. COMPARISON:  April 18, 2017 FINDINGS: Brain: There is mild supratentorial atrophy with moderate cerebellar atrophy, stable. There is no intracranial mass, hemorrhage, extra-axial fluid collection, or midline shift. There is stable mild posterior right parietal encephalomalacia. There is no demonstrable acute infarct. Vascular: No hyperdense vessel. There is no appreciable vascular calcification. Skull: Postoperative change in the right parietal bone is stable. Bony calvarium otherwise appears intact and unchanged. Sinuses/Orbits: There is mucosal thickening in several ethmoid air cells. Other visualized  paranasal sinuses are clear. Visualized orbits appear symmetric bilaterally. Other: Mastoid air cells on the left are clear. There is opacification in several inferior mastoid air cells on the right. IMPRESSION: Stable atrophy with atrophy greatest in the cerebellum. Postoperative change right posterior parietal region with mild encephalomalacia in this area. No acute infarct. Brain parenchyma otherwise appears unremarkable. No mass or hemorrhage. Mucosal thickening noted in several ethmoid air cells. Opacification in several inferior mastoid air cells on the right. Electronically Signed   By: Bretta BangWilliam  Woodruff III M.D.   On: 03/14/2019 11:45   Dg Chest Port 1 View  Result Date: 03/14/2019 CLINICAL DATA:  Cough. Increasing weakness. EXAM: PORTABLE CHEST 1 VIEW COMPARISON:  December 28, 2015 FINDINGS: Cardiomediastinal silhouette is normal. Mediastinal contours appear intact. Confluent airspace consolidation in the left lower lung field. Chronic elevation of left hemidiaphragm. Concavity of the left chest wall,  likely posttraumatic. Soft tissues are grossly normal. IMPRESSION: 1. Confluent airspace consolidation in the left lower lung field, concerning for pneumonia. 2. Chronic elevation of the left hemidiaphragm, likely posttraumatic. Electronically Signed   By: Fidela Salisbury M.D.   On: 03/14/2019 09:56        Scheduled Meds:  desvenlafaxine  50 mg Oral Daily   divalproex  500 mg Oral Daily   enoxaparin (LOVENOX) injection  40 mg Subcutaneous Q24H   lamoTRIgine  50 mg Oral BID   [START ON 03/16/2019] levothyroxine  50 mcg Oral Q0600   Continuous Infusions:  sodium chloride 1,000 mL (03/15/19 0949)   ceFEPime (MAXIPIME) IV Stopped (03/15/19 0436)   vancomycin       LOS: 1 day    Time spent: 30 minutes    Barb Merino, MD [facility] Pager [phone]

## 2019-03-15 NOTE — Evaluation (Signed)
Physical Therapy Evaluation Patient Details Name: Shannon Barry MRN: [id number] DOB: 1951/03/28 Today's Date: 03/15/2019   History of Present Illness  68 y.o. female with medical history significant for depression, seizures, hypothyroidism, TBI, was brought by EMS as patient have been having generalized weakness, cough, subjective fever/chills for the past couple of days. Pt admitted for acute metabolic encephalopathy and Sepsis present on admission due to left lower lobe pneumonia: Bacterial pneumonia suspect aspiration  Clinical Impression  Pt admitted with above diagnosis.  Pt currently with functional limitations due to the deficits listed below (see PT Problem List). Pt will benefit from skilled PT to increase their independence and safety with mobility to allow discharge to the venue listed below.  Pt assisted with ambulating in room and requiring min assist for mobility at this time.  Pt states her son lives with her however she is typically able to ambulate and perform ADLs without assist.  However, pt appears in/out of confusion so uncertain of accuracy.  If 24/7 assist not available at home then would recommend SNF.     Follow Up Recommendations Supervision/Assistance - 24 hour;Home health PT(if not 24/7 assist, recommend SNF)    Equipment Recommendations  None recommended by PT    Recommendations for Other Services       Precautions / Restrictions Precautions Precautions: Fall      Mobility  Bed Mobility Overal bed mobility: Needs Assistance Bed Mobility: Supine to Sit;Sit to Supine     Supine to sit: Supervision;HOB elevated Sit to supine: Supervision;HOB elevated      Transfers Overall transfer level: Needs assistance Equipment used: 1 person hand held assist Transfers: Sit to/from Stand Sit to Stand: Min assist         General transfer comment: assist to rise and steady; pt requested a hand to self assist; refused using RW  today  Ambulation/Gait Ambulation/Gait assistance: Min assist Gait Distance (Feet): 10 Feet Assistive device: IV Pole;1 person hand held assist Gait Pattern/deviations: Step-through pattern;Decreased stride length     General Gait Details: pt utilized IV pole and HHA for support/steadying; declined use of RW however would have been beneficial; remained in room as pt also did not want to don socks  Stairs            Wheelchair Mobility    Modified Rankin (Stroke Patients Only)       Balance Overall balance assessment: History of Falls;Needs assistance         Standing balance support: Bilateral upper extremity supported Standing balance-Leahy Scale: Poor Standing balance comment: requiring UE support                             Pertinent Vitals/Pain Pain Assessment: No/denies pain    Home Living Family/patient expects to be discharged to:: Private residence Living Arrangements: Children(son, [name])   Type of Home: House       Home Layout: Able to live on main level with bedroom/bathroom Home Equipment: Walker - 2 wheels;Cane - single point      Prior Function Level of Independence: Independent         Comments: pt reports being independent with mobility and ADLs (?accuracy)     Hand Dominance        Extremity/Trunk Assessment   Upper Extremity Assessment Upper Extremity Assessment: Generalized weakness    Lower Extremity Assessment Lower Extremity Assessment: Generalized weakness       Communication   Communication: No difficulties  Cognition Arousal/Alertness: Awake/alert   Overall Cognitive Status: No family/caregiver present to determine baseline cognitive functioning                                 General Comments: pt recalls falling at home and ambulance to hospital; however pt in/out confusion      General Comments      Exercises     Assessment/Plan    PT Assessment Patient needs continued  PT services  PT Problem List Decreased strength;Decreased balance;Decreased knowledge of use of DME;Decreased mobility;Decreased activity tolerance;Decreased safety awareness;Decreased cognition       PT Treatment Interventions DME instruction;Therapeutic exercise;Gait training;Balance training;Functional mobility training;Therapeutic activities;Patient/family education;Stair training    PT Goals (Current goals can be found in the Care Plan section)  Acute Rehab PT Goals PT Goal Formulation: With patient Time For Goal Achievement: 03/29/19 Potential to Achieve Goals: Good    Frequency Min 3X/week   Barriers to discharge        Co-evaluation               AM-PAC PT "6 Clicks" Mobility  Outcome Measure Help needed turning from your back to your side while in a flat bed without using bedrails?: A Little Help needed moving from lying on your back to sitting on the side of a flat bed without using bedrails?: A Little Help needed moving to and from a bed to a chair (including a wheelchair)?: A Little Help needed standing up from a chair using your arms (e.g., wheelchair or bedside chair)?: A Little Help needed to walk in hospital room?: A Little Help needed climbing 3-5 steps with a railing? : A Lot 6 Click Score: 17    End of Session Equipment Utilized During Treatment: Gait belt Activity Tolerance: Patient tolerated treatment well Patient left: in bed;with call bell/phone within reach;with bed alarm set Nurse Communication: Mobility status PT Visit Diagnosis: Other abnormalities of gait and mobility (R26.89)    Time: 2951-8841 PT Time Calculation (min) (ACUTE ONLY): 24 min   Charges:   PT Evaluation $PT Eval Low Complexity: Lenora, PT, DPT Acute Rehabilitation Services Office: [phone] Pager: [phone]  Trena Platt 03/15/2019, 4:15 PM

## 2019-03-16 LAB — BASIC METABOLIC PANEL
Anion gap: 10 (ref 5–15)
BUN: 9 mg/dL (ref 8–23)
CO2: 20 mmol/L — ABNORMAL LOW (ref 22–32)
Calcium: 8 mg/dL — ABNORMAL LOW (ref 8.9–10.3)
Chloride: 107 mmol/L (ref 98–111)
Creatinine, Ser: 0.38 mg/dL — ABNORMAL LOW (ref 0.44–1.00)
GFR calc Af Amer: >60 mL/min (ref >60)
GFR calc non Af Amer: >60 mL/min (ref >60)
Glucose, Bld: 91 mg/dL (ref 70–99)
Potassium: 4.4 mmol/L (ref 3.5–5.1)
Sodium: 137 mmol/L (ref 135–145)

## 2019-03-16 LAB — CBC WITH DIFFERENTIAL/PLATELET
Abs Immature Granulocytes: 0.12 10*3/uL — ABNORMAL HIGH (ref 0.00–0.07)
Basophils Absolute: 0 10*3/uL (ref 0.0–0.1)
Basophils Relative: 1 %
Eosinophils Absolute: 0.1 10*3/uL (ref 0.0–0.5)
Eosinophils Relative: 1 %
HCT: 32.3 % — ABNORMAL LOW (ref 36.0–46.0)
Hemoglobin: 11.5 g/dL — ABNORMAL LOW (ref 12.0–15.0)
Immature Granulocytes: 2 %
Lymphocytes Relative: 16 %
Lymphs Abs: 1.2 10*3/uL (ref 0.7–4.0)
MCH: 30.8 pg (ref 26.0–34.0)
MCHC: 35.6 g/dL (ref 30.0–36.0)
MCV: 86.6 fL (ref 80.0–100.0)
Monocytes Absolute: 1.4 10*3/uL — ABNORMAL HIGH (ref 0.1–1.0)
Monocytes Relative: 18 %
Neutro Abs: 5 10*3/uL (ref 1.7–7.7)
Neutrophils Relative %: 62 %
Platelets: 212 10*3/uL (ref 150–400)
RBC: 3.73 MIL/uL — ABNORMAL LOW (ref 3.87–5.11)
RDW: 12.5 % (ref 11.5–15.5)
WBC: 7.9 10*3/uL (ref 4.0–10.5)
nRBC: 0 % (ref 0.0–0.2)

## 2019-03-16 LAB — URINE CULTURE: Culture: NO GROWTH

## 2019-03-16 LAB — MAGNESIUM: Magnesium: 1.8 mg/dL (ref 1.7–2.4)

## 2019-03-16 LAB — PHOSPHORUS: Phosphorus: 2.4 mg/dL — ABNORMAL LOW (ref 2.5–4.6)

## 2019-03-16 NOTE — Progress Notes (Signed)
Physical Therapy Treatment Patient Details Name: Shannon Barry MRN: [id number] DOB: 08-30-1950 Today's Date: 03/16/2019    History of Present Illness 68 y.o. female with medical history significant for depression, seizures, hypothyroidism, TBI, was brought by EMS as patient have been having generalized weakness, cough, subjective fever/chills for the past couple of days. Pt admitted for acute metabolic encephalopathy and Sepsis present on admission due to left lower lobe pneumonia: Bacterial pneumonia suspect aspiration    PT Comments    Pt assisted with ambulating in hallway and presents with frequent unsteadiness requiring assist as well as decreased safety awareness.  Continue to recommend 24/7 assist upon d/c for pt safety.    Follow Up Recommendations  Supervision/Assistance - 24 hour;Home health PT(if not 24/7 assist, then SNF)     Equipment Recommendations  None recommended by PT    Recommendations for Other Services       Precautions / Restrictions Precautions Precautions: Fall    Mobility  Bed Mobility Overal bed mobility: Needs Assistance Bed Mobility: Supine to Sit;Sit to Supine     Supine to sit: Supervision;HOB elevated Sit to supine: HOB elevated;Supervision   General bed mobility comments: pt in chair  Transfers Overall transfer level: Needs assistance Equipment used: 1 person hand held assist Transfers: Sit to/from Stand Sit to Stand: Min assist Stand pivot transfers: Min assist       General transfer comment: verbal cues for safe technique  Ambulation/Gait Ambulation/Gait assistance: Min assist Gait Distance (Feet): 350 Feet Assistive device: IV Pole;1 person hand held assist Gait Pattern/deviations: Step-through pattern;Decreased stride length     General Gait Details: pt utilized IV pole and HHA for support/steadying; pt again declined use of RW however would have been beneficial; pt required occasional min assist for steadying   Stairs              Wheelchair Mobility    Modified Rankin (Stroke Patients Only)       Balance Overall balance assessment: History of Falls;Needs assistance         Standing balance support: Bilateral upper extremity supported Standing balance-Leahy Scale: Poor Standing balance comment: requiring UE support                            Cognition Arousal/Alertness: Awake/alert   Overall Cognitive Status: Impaired/Different from baseline Area of Impairment: Safety/judgement                         Safety/Judgement: Decreased awareness of safety;Decreased awareness of deficits     General Comments: OT called son to explain pt would need at home.  Pt seems more confused than baseline based on this conversation      Exercises      General Comments        Pertinent Vitals/Pain Pain Assessment: No/denies pain    Home Living Family/patient expects to be discharged to:: Private residence Living Arrangements: Children(son, [name])   Type of Home: House     Home Layout: Able to live on main level with bedroom/bathroom Home Equipment: Walker - 2 wheels;Cane - single point      Prior Function Level of Independence: Independent      Comments: pt reports being independent with mobility and ADLs (?accuracy)   PT Goals (current goals can now be found in the care plan section) Acute Rehab PT Goals Patient Stated Goal: home to walk dogs Progress towards PT goals: Progressing toward goals  Frequency    Min 3X/week      PT Plan Current plan remains appropriate    Co-evaluation              AM-PAC PT "6 Clicks" Mobility   Outcome Measure  Help needed turning from your back to your side while in a flat bed without using bedrails?: A Little Help needed moving from lying on your back to sitting on the side of a flat bed without using bedrails?: A Little Help needed moving to and from a bed to a chair (including a wheelchair)?: A  Little Help needed standing up from a chair using your arms (e.g., wheelchair or bedside chair)?: A Little Help needed to walk in hospital room?: A Little Help needed climbing 3-5 steps with a railing? : A Lot 6 Click Score: 17    End of Session Equipment Utilized During Treatment: Gait belt Activity Tolerance: Patient tolerated treatment well Patient left: in bed;with call bell/phone within reach;with bed alarm set Nurse Communication: Mobility status PT Visit Diagnosis: Other abnormalities of gait and mobility (R26.89)     Time: 1443-1500 PT Time Calculation (min) (ACUTE ONLY): 17 min  Charges:  $Gait Training: 8-22 mins                     Carmelia Bake, PT, DPT Acute Rehabilitation Services Office: [phone] Pager: [place] 03/16/2019, 3:36 PM

## 2019-03-16 NOTE — Evaluation (Signed)
Occupational Therapy Evaluation Patient Details Name: Shannon Barry MRN: [id number] DOB: 1951-05-28 Today's Date: 03/16/2019    History of Present Illness 68 y.o. female with medical history significant for depression, seizures, hypothyroidism, TBI, was brought by EMS as patient have been having generalized weakness, cough, subjective fever/chills for the past couple of days. Pt admitted for acute metabolic encephalopathy and Sepsis present on admission due to left lower lobe pneumonia: Bacterial pneumonia suspect aspiration   Clinical Impression   Pt admitted with weakness. Pt currently with functional limitations due to the deficits listed below (see OT Problem List).  Pt will benefit from skilled OT to increase their safety and independence with ADL and functional mobility for ADL to facilitate discharge to venue listed below.   OT called son and discussed DC plan. Pt currently does not have 24/7 A but would need this for safety.  Son will speak to brother and his mother to plan accordingly.     Follow Up Recommendations  SNF;Home health OT;Supervision/Assistance - 24 hour(depending on families A)    Equipment Recommendations  None recommended by OT    Recommendations for Other Services       Precautions / Restrictions Precautions Precautions: Fall      Mobility Bed Mobility               General bed mobility comments: pt in chair  Transfers Overall transfer level: Needs assistance Equipment used: 1 person hand held assist Transfers: Sit to/from Stand;Stand Pivot Transfers Sit to Stand: Min assist Stand pivot transfers: Min assist       General transfer comment: VC for safety. Pt feels she could safely walk her dog- which she could not do this safety    Balance Overall balance assessment: History of Falls;Needs assistance         Standing balance support: Bilateral upper extremity supported Standing balance-Leahy Scale: Poor Standing balance comment:  requiring UE support                           ADL either performed or assessed with clinical judgement   ADL Overall ADL's : Needs assistance/impaired Eating/Feeding: Set up;Sitting   Grooming: Set up;Sitting   Upper Body Bathing: Minimal assistance;Sitting   Lower Body Bathing: Moderate assistance;Sit to/from stand;Cueing for compensatory techniques;Cueing for safety;Cueing for sequencing   Upper Body Dressing : Minimal assistance;Sitting   Lower Body Dressing: Moderate assistance;Cueing for safety;Sit to/from stand;Cueing for compensatory techniques   Toilet Transfer: Minimal assistance;Stand-pivot;Cueing for safety;Cueing for sequencing;BSC   Toileting- Clothing Manipulation and Hygiene: Minimal assistance;Sit to/from stand;Cueing for compensatory techniques;Cueing for safety                            Pertinent Vitals/Pain Pain Assessment: No/denies pain     Hand Dominance     Extremity/Trunk Assessment Upper Extremity Assessment Upper Extremity Assessment: Generalized weakness           Communication Communication Communication: No difficulties   Cognition Arousal/Alertness: Awake/alert   Overall Cognitive Status: Impaired/Different from baseline Area of Impairment: Safety/judgement                         Safety/Judgement: Decreased awareness of safety;Decreased awareness of deficits     General Comments: OT called son to explain pt would need at home.  Pt seems more confused than baseline based on this conversation  Home Living Family/patient expects to be discharged to:: Private residence Living Arrangements: Children(son, [name])   Type of Home: House       Home Layout: Able to live on main level with bedroom/bathroom               Home Equipment: Dan Humphreys - 2 wheels;Cane - single point          Prior Functioning/Environment Level of Independence: Independent        Comments: pt  reports being independent with mobility and ADLs (?accuracy)        OT Problem List: Decreased strength;Decreased activity tolerance;Impaired balance (sitting and/or standing);Decreased knowledge of use of DME or AE;Decreased knowledge of precautions;Decreased safety awareness      OT Treatment/Interventions: Self-care/ADL training;Patient/family education;DME and/or AE instruction    OT Goals(Current goals can be found in the care plan section) Acute Rehab OT Goals Patient Stated Goal: home to walk dogs OT Goal Formulation: With patient Time For Goal Achievement: 03/30/19 ADL Goals Pt Will Perform Lower Body Dressing: with supervision;sit to/from stand Pt Will Transfer to Toilet: with supervision;ambulating;regular height toilet Pt Will Perform Toileting - Clothing Manipulation and hygiene: with supervision;sit to/from stand  OT Frequency: Min 2X/week   Barriers to D/C:               AM-PAC OT "6 Clicks" Daily Activity     Outcome Measure Help from another person eating meals?: None Help from another person taking care of personal grooming?: A Little Help from another person toileting, which includes using toliet, bedpan, or urinal?: A Little Help from another person bathing (including washing, rinsing, drying)?: A Little Help from another person to put on and taking off regular upper body clothing?: None Help from another person to put on and taking off regular lower body clothing?: A Little 6 Click Score: 20   End of Session Equipment Utilized During Treatment: Gait belt Nurse Communication: Mobility status  Activity Tolerance: Patient limited by fatigue Patient left: in chair;with call bell/phone within reach  OT Visit Diagnosis: Unsteadiness on feet (R26.81)                Time: 1150-1210 OT Time Calculation (min): 20 min Charges:  OT General Charges $OT Visit: 1 Visit OT Evaluation $OT Eval Moderate Complexity: 1 Mod  Lise Auer, OT Acute Rehabilitation  Services [phone] Office- [phone]     Cord Wilczynski, Karin Golden D 03/16/2019, 1:23 PM

## 2019-03-16 NOTE — Progress Notes (Signed)
Patient refused lovenox injection this morning. Patient was educated and she still refused. MD made aware via text page.

## 2019-03-16 NOTE — Progress Notes (Signed)
PROGRESS NOTE    [name]  BJY:[id number] DOB: 08/17/1950 DOA: 03/14/2019 PCP: Reymundo Poll, MD    Brief Narrative:  Shannon Barry is a 68 y.o. female with medical history significant for depression, seizures, hypothyroidism, TBI, was brought by EMS as patient have been having generalized weakness, cough, subjective fever/chills for the past couple of days.  Patient also noted to be more confused/agitated over the past couple of days as well.  EDP spoke to patient's son who noted patient has been more confused, overall very weak for the past 3 days.   In the emergency, patient was confused and delirious, afebrile, hypotensive which was responsive to IV fluids, currently saturating well on room air, lactic acid 2.3 on admission, sodium 126, hemoglobin of 10.4.  CT head done showed no acute infarct, postoperative change right posterior parietal region with mild encephalomalacia.  Chest x-ray showed confluent airspace consolidation in the left lower lung field concerning for pneumonia.  COVID-19 negative.  Patient admitted for further management.   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  Seen by speech therapy.  No overt evidence of aspiration.Hemodynamically stabilizing.  Blood cultures and urine cultures negative so far.  Unable to produce sputum.  COVID-19 negative. Penicillin allergy. On vancomycin and cefepime. Continues to have clinical improvement, will discontinue vancomycin.  Continue cefepime. Cough and deep breathing exercises, incentive spirometry,  Acute metabolic encephalopathy in the setting of traumatic brain  injury and posttraumatic seizure disorders: Metabolic infective encephalopathy.  No focal deficit.  CT head normal. Continue close monitoring.  Delirium precautions.  Fall precautions. Patient is on multiple seizure medication and mood stabilizer including Pristiq, Depakote, lamotrigine that she will continue.  Hypochloremic hyponatremia: Resuscitated with isotonic saline with improvement.  Corrected  Hypothyroidism: TSH is suppressed to less than 0.2.  Will decrease thyroxine to 50 mcg daily.  Patient with improved mental status today.  She worked with PT OT, she had difficulty following directions.  Today she will be more cooperative. Nursing staff to continue to advance activities and ambulate in the hallway.  DVT prophylaxis: Lovenox subcu Code Status: Full code Family Communication: Patient's son Shannon Barry.   Disposition Plan: Home with home health versus SNF.  Lives with son Shannon Barry.   Consultants:   None  Procedures:   None  Antimicrobials:   Vancomycin, 03/14/2019--03/16/2019  Cefepime, 03/14/2019---   Subjective: Patient was seen and examined.  No overnight events.  Patient was more coherent, composed and interactive today.  She was able to give all history, her living conditions and family descriptions. Patient states that she was independently walking her dog about a week ago.   Objective: Vitals:   03/15/19 1430 03/15/19 1455 03/15/19 2016 03/16/19 0458  BP: 121/69 127/65 127/71 (!) 150/84  Pulse: 81 80 82 93  Resp: 18 16 16 16   Temp: 97.9 F (36.6 C) 97.7 F (36.5 C) 97.9 F (36.6 C) 98.3 F (36.8 C)  TempSrc: Oral Oral Oral Oral  SpO2: 98% 99% 100% 94%  Weight:      Height:        Intake/Output Summary (Last 24 hours) at 03/16/2019 1123 Last data filed  at 03/16/2019 1100 Gross per 24 hour  Intake 2464.36 ml  Output 1 ml  Net 2463.36 ml   Filed Weights   03/14/19 0844  Weight: 52.2 kg    Examination:  General exam: Appears calm and  comfortable.  Chronically sick looking, however fairly stable today.  On room air. Respiratory system: Clear to auscultation. Respiratory effort normal.  No added sounds. Cardiovascular system: S1 & S2 heard, RRR. No JVD, murmurs, rubs, gallops or clicks. No pedal edema. Gastrointestinal system: Abdomen is nondistended, soft and nontender. No organomegaly or masses felt. Normal bowel sounds heard. Central nervous system: Alert and oriented.  No focal neurological deficits. Extremities: Symmetric 5 x 5 power. Skin: No rashes, lesions or ulcers Psychiatry: Judgement and insight appear normal.      Data Reviewed: I have personally reviewed following labs and imaging studies  CBC: Recent Labs  Lab 03/14/19 0951 03/15/19 0549 03/16/19 0808  WBC 7.8 7.6 7.9  NEUTROABS 5.9  --  5.0  HGB 10.4* 10.6* 11.5*  HCT 30.0* 31.7* 32.3*  MCV 88.8 91.1 86.6  PLT 214 253 212   Basic Metabolic Panel: Recent Labs  Lab 03/14/19 0951 03/15/19 0549 03/16/19 0808  NA 126* 137 137  K 3.3* 3.9 4.4  CL 94* 108 107  CO2 22 21* 20*  GLUCOSE 148* 135* 91  BUN 9 10 9   CREATININE 0.53 0.49 0.38*  CALCIUM 8.0* 8.2* 8.0*  MG 2.0  --  1.8  PHOS  --   --  2.4*   GFR: Estimated Creatinine Clearance: 57 mL/min (A) (by C-G formula based on SCr of 0.38 mg/dL (L)). Liver Function Tests: Recent Labs  Lab 03/14/19 0951  AST 21  ALT 19  ALKPHOS 47  BILITOT 0.5  PROT 5.4*  ALBUMIN 2.3*   No results for input(s): LIPASE, AMYLASE in the last 168 hours. Recent Labs  Lab 03/14/19 0932  AMMONIA 44*   Coagulation Profile: No results for input(s): INR, PROTIME in the last 168 hours. Cardiac Enzymes: No results for input(s): CKTOTAL, CKMB, CKMBINDEX, TROPONINI in the last 168 hours. BNP (last 3 results) No results for input(s): PROBNP in the last 8760 hours. HbA1C: No results for input(s): HGBA1C in the last 72 hours. CBG: No results for input(s): GLUCAP in the last 168 hours. Lipid Profile: No  results for input(s): CHOL, HDL, LDLCALC, TRIG, CHOLHDL, LDLDIRECT in the last 72 hours. Thyroid Function Tests: Recent Labs    03/14/19 0951 03/14/19 1000  TSH 0.181*  --   FREET4  --  1.24*   Anemia Panel: Recent Labs    03/15/19 0549  VITAMINB12 242  FOLATE 14.8  FERRITIN 187  TIBC 195*  IRON 15*   Sepsis Labs: Recent Labs  Lab 03/14/19 0904 03/14/19 1652  LATICACIDVEN 2.3* 1.3    Recent Results (from the past 240 hour(s))  SARS CORONAVIRUS 2 (TAT 6-24 HRS) Nasopharyngeal Nasopharyngeal Swab     Status: None   Collection Time: 03/14/19  9:48 AM   Specimen: Nasopharyngeal Swab  Result Value Ref Range Status   SARS Coronavirus 2 NEGATIVE NEGATIVE Final    Comment: (NOTE) SARS-CoV-2 target nucleic acids are NOT DETECTED. The SARS-CoV-2 RNA is generally detectable in upper and lower respiratory specimens during the acute phase of infection. Negative results do not preclude SARS-CoV-2 infection, do not rule out co-infections with other pathogens, and should not be used as the sole basis for treatment or other patient management decisions. Negative results must be combined with clinical  observations, patient history, and epidemiological information. The expected result is Negative. Fact Sheet for Patients: HairSlick.no Fact Sheet for Healthcare Providers: quierodirigir.com This test is not yet approved or cleared by the [place] FDA and  has been authorized for detection and/or diagnosis of SARS-CoV-2 by FDA under an Emergency Use Authorization (EUA). This EUA will remain  in effect (meaning this test can be used) for the duration of the COVID-19 declaration under Section 56 4(b)(1) of the Act, 21 U.S.C. section 360bbb-3(b)(1), unless the authorization is terminated or revoked sooner. Performed at [facility], [address] [place], [place] [address]   Blood Culture (routine x 2)     Status: None  (Preliminary result)   Collection Time: 03/14/19  9:51 AM   Specimen: BLOOD LEFT FOREARM  Result Value Ref Range Status   Specimen Description   Final    BLOOD LEFT FOREARM Performed at [facility], [address], [place], [place] [address]    Special Requests   Final    BOTTLES DRAWN AEROBIC AND ANAEROBIC Blood Culture adequate volume Performed at [facility], [address], [place], [place] [address]    Culture   Final    NO GROWTH 2 DAYS Performed at [facility], [address], [place], [place] [address]    Report Status PENDING  Incomplete  Blood Culture (routine x 2)     Status: None (Preliminary result)   Collection Time: 03/14/19  9:52 AM   Specimen: BLOOD RIGHT HAND  Result Value Ref Range Status   Specimen Description   Final    BLOOD RIGHT HAND Performed at [facility], [address], [place], [place] [address]    Special Requests   Final    BOTTLES DRAWN AEROBIC AND ANAEROBIC Blood Culture results may not be optimal due to an excessive volume of blood received in culture bottles Performed at [facility], [address], [place], [place] [address]    Culture   Final    NO GROWTH 2 DAYS Performed at [facility], [address], [place], [place] [address]    Report Status PENDING  Incomplete  Urine culture     Status: None   Collection Time: 03/15/19  6:29 AM   Specimen: In/Out Cath Urine  Result Value Ref Range Status   Specimen Description   Final    IN/OUT CATH URINE Performed at [facility], [address] [place], [place] [address]    Special Requests   Final    NONE Performed at [facility], [address], [place], [place] [address]    Culture   Final    NO GROWTH Performed at [facility], [address] [place], [place] [address]    Report Status 03/16/2019 FINAL  Final         Radiology Studies: Ct Head Wo  Contrast  Result Date: 03/14/2019 CLINICAL DATA:  Altered mental status and fever EXAM: CT HEAD WITHOUT CONTRAST TECHNIQUE: Contiguous axial images were obtained from the base of the skull through the vertex without intravenous contrast. COMPARISON:  April 18, 2017 FINDINGS: Brain: There is mild supratentorial atrophy with moderate cerebellar atrophy, stable. There is no intracranial mass, hemorrhage, extra-axial fluid collection, or midline shift. There is stable mild posterior right parietal encephalomalacia. There is no demonstrable acute infarct. Vascular: No hyperdense vessel. There is no appreciable vascular calcification. Skull: Postoperative change in the  right parietal bone is stable. Bony calvarium otherwise appears intact and unchanged. Sinuses/Orbits: There is mucosal thickening in several ethmoid air cells. Other visualized paranasal sinuses are clear. Visualized orbits appear symmetric bilaterally. Other: Mastoid air cells on the left are clear. There is opacification in several inferior mastoid air cells on the right. IMPRESSION: Stable atrophy with atrophy greatest in the cerebellum. Postoperative change right posterior parietal region with mild encephalomalacia in this area. No acute infarct. Brain parenchyma otherwise appears unremarkable. No mass or hemorrhage. Mucosal thickening noted in several ethmoid air cells. Opacification in several inferior mastoid air cells on the right. Electronically Signed   By: Bretta Bang III M.D.   On: 03/14/2019 11:45        Scheduled Meds: . desvenlafaxine  50 mg Oral Daily  . divalproex  500 mg Oral Daily  . enoxaparin (LOVENOX) injection  40 mg Subcutaneous Q24H  . lamoTRIgine  50 mg Oral BID  . levothyroxine  50 mcg Oral Q0600   Continuous Infusions: . sodium chloride Stopped (03/16/19 0323)  . ceFEPime (MAXIPIME) IV 2 g (03/16/19 0323)     LOS: 2 days    Time spent: 25 minutes    Dorcas Carrow, MD [facility]  Pager [phone]

## 2019-03-17 MED ORDER — DOXYCYCLINE HYCLATE 100 MG PO TABS
100.0000 mg | ORAL_TABLET | Freq: Two times a day (BID) | ORAL | Status: AC
  Administered 2019-03-17 – 2019-03-19 (×6): 100 mg via ORAL
  Filled 2019-03-17 (×6): qty 1

## 2019-03-17 NOTE — Progress Notes (Signed)
PROGRESS NOTE    Shannon Barry  XOV:[id number] DOB: 1950/10/31 DOA: 03/14/2019 PCP: Florentina Jenny, MD    Brief Narrative:  Shannon Barry is a 68 y.o. female with medical history significant for depression, seizures, hypothyroidism, TBI, was brought by EMS as patient have been having generalized weakness, cough, subjective fever/chills for the past couple of days.  Patient also noted to be more confused/agitated over the past couple of days as well.  EDP spoke to patient's son who noted patient has been more confused, overall very weak for the past 3 days.   In the emergency, patient was confused and delirious, afebrile, hypotensive which was responsive to IV fluids, currently saturating well on room air, lactic acid 2.3 on admission, sodium 126, hemoglobin of 10.4.  CT head done showed no acute infarct, postoperative change right posterior parietal region with mild encephalomalacia.  Chest x-ray showed confluent airspace consolidation in the left lower lung field concerning for pneumonia.  COVID-19 negative.  Patient admitted for further management.   Assessment & Plan:   Principal Problem:   Acute metabolic encephalopathy Active Problems:   Dementia with behavioral disturbance (HCC)   Post-traumatic seizures (HCC)   TBI (traumatic brain injury) (HCC)   Mixed bipolar I disorder (HCC)   History of alcohol abuse   Alzheimer's disease (HCC)   Pneumonia  Sepsis present on admission due to left lower lobe pneumonia: Bacterial pneumonia suspected aspiration.  Seen by speech therapy.  No overt evidence of aspiration.Hemodynamically stabilizing.  Blood cultures and urine cultures negative so far.  Unable to produce sputum.  COVID-19 negative. Penicillin allergy.  Initially treated with vancomycin and cefepime.  Vancomycin discontinued.  Clinically stabilized. Discontinue all IV antibiotics.  Will start on oral doxycycline for 3 more days to finish total 5 days of therapy. Cough and deep breathing  exercises, incentive spirometry,  Acute metabolic encephalopathy in the setting of traumatic brain injury and posttraumatic seizure disorders: Metabolic infective encephalopathy.  No focal deficit.  CT head normal. Continue close monitoring.  Delirium precautions.  Fall precautions. Patient is on multiple seizure medication and mood stabilizer including Pristiq, Depakote, lamotrigine that she will continue. Her mental status has improved.  Hypochloremic hyponatremia: Resuscitated with isotonic saline with improvement.  Corrected  Hypothyroidism: TSH is suppressed to less than 0.2.  Will decrease thyroxine to 50 mcg daily.  Physical deconditioning: Patient with profound physical deconditioning.  Work with PT OT.  She will benefit with inpatient therapies.  Consult Child psychotherapist.  DVT prophylaxis: Lovenox subcu Code Status: Full code Family Communication: Patient's son [name].  Son is stated no adequate support at home for 24/7, recommended a skilled nursing facility placement. Disposition Plan: SNF.  Medically stabilizing.   Consultants:   None  Procedures:   None  Antimicrobials:   Vancomycin, 03/14/2019--03/16/2019  Cefepime, 03/14/2019--- 03/17/2019  Doxycycline, 03/17/2019---   Subjective: Patient was seen and examined.  No overnight events.  Patient was more coherent, composed and interactive today.  Patient is unsteady and needed nursing support to walk around.  She would really like to go home, however her son likes her to go to skilled nursing facility.  She does not have 24/7 care at home.  Objective: Vitals:   03/16/19 1316 03/16/19 1316 03/16/19 2344 03/17/19 0616  BP: 132/70 132/70 138/69 136/81  Pulse: 77 79 68 84  Resp: 14 14 17 16   Temp: (!) 97.5 F (36.4 C) (!) 97.5 F (36.4 C) (!) 97.4 F (36.3 C) 98.2 F (36.8 C)  TempSrc:  Oral Oral Oral Oral  SpO2: 94% 92% 92% 92%  Weight:      Height:        Intake/Output Summary (Last 24 hours) at  03/17/2019 1123 Last data filed at 03/17/2019 0758 Gross per 24 hour  Intake 1663.6 ml  Output 3 ml  Net 1660.6 ml   Filed Weights   03/14/19 0844  Weight: 52.2 kg    Examination:  General exam: Appears calm and comfortable.  Sitting in couch eating breakfast. On room air. Respiratory system: Clear to auscultation. Respiratory effort normal.  No added sounds. Cardiovascular system: S1 & S2 heard, RRR. No JVD, murmurs, rubs, gallops or clicks. No pedal edema. Gastrointestinal system: Abdomen is nondistended, soft and nontender. No organomegaly or masses felt. Normal bowel sounds heard. Central nervous system: Alert and oriented.  No focal neurological deficits. Extremities: Symmetric 5 x 5 power. Skin: No rashes, lesions or ulcers Psychiatry: Judgement and insight appear normal.      Data Reviewed: I have personally reviewed following labs and imaging studies  CBC: Recent Labs  Lab 03/14/19 0951 03/15/19 0549 03/16/19 0808  WBC 7.8 7.6 7.9  NEUTROABS 5.9  --  5.0  HGB 10.4* 10.6* 11.5*  HCT 30.0* 31.7* 32.3*  MCV 88.8 91.1 86.6  PLT 214 253 694   Basic Metabolic Panel: Recent Labs  Lab 03/14/19 0951 03/15/19 0549 03/16/19 0808  NA 126* 137 137  K 3.3* 3.9 4.4  CL 94* 108 107  CO2 22 21* 20*  GLUCOSE 148* 135* 91  BUN 9 10 9   CREATININE 0.53 0.49 0.38*  CALCIUM 8.0* 8.2* 8.0*  MG 2.0  --  1.8  PHOS  --   --  2.4*   GFR: Estimated Creatinine Clearance: 57 mL/min (A) (by C-G formula based on SCr of 0.38 mg/dL (L)). Liver Function Tests: Recent Labs  Lab 03/14/19 0951  AST 21  ALT 19  ALKPHOS 47  BILITOT 0.5  PROT 5.4*  ALBUMIN 2.3*   No results for input(s): LIPASE, AMYLASE in the last 168 hours. Recent Labs  Lab 03/14/19 0932  AMMONIA 44*   Coagulation Profile: No results for input(s): INR, PROTIME in the last 168 hours. Cardiac Enzymes: No results for input(s): CKTOTAL, CKMB, CKMBINDEX, TROPONINI in the last 168 hours. BNP (last 3 results)  No results for input(s): PROBNP in the last 8760 hours. HbA1C: No results for input(s): HGBA1C in the last 72 hours. CBG: No results for input(s): GLUCAP in the last 168 hours. Lipid Profile: No results for input(s): CHOL, HDL, LDLCALC, TRIG, CHOLHDL, LDLDIRECT in the last 72 hours. Thyroid Function Tests: No results for input(s): TSH, T4TOTAL, FREET4, T3FREE, THYROIDAB in the last 72 hours. Anemia Panel: Recent Labs    03/15/19 0549  VITAMINB12 242  FOLATE 14.8  FERRITIN 187  TIBC 195*  IRON 15*   Sepsis Labs: Recent Labs  Lab 03/14/19 0904 03/14/19 1652  LATICACIDVEN 2.3* 1.3    Recent Results (from the past 240 hour(s))  SARS CORONAVIRUS 2 (TAT 6-24 HRS) Nasopharyngeal Nasopharyngeal Swab     Status: None   Collection Time: 03/14/19  9:48 AM   Specimen: Nasopharyngeal Swab  Result Value Ref Range Status   SARS Coronavirus 2 NEGATIVE NEGATIVE Final    Comment: (NOTE) SARS-CoV-2 target nucleic acids are NOT DETECTED. The SARS-CoV-2 RNA is generally detectable in upper and lower respiratory specimens during the acute phase of infection. Negative results do not preclude SARS-CoV-2 infection, do not rule out co-infections with other  pathogens, and should not be used as the sole basis for treatment or other patient management decisions. Negative results must be combined with clinical observations, patient history, and epidemiological information. The expected result is Negative. Fact Sheet for Patients: HairSlick.nohttps://www.fda.gov/media/138098/download Fact Sheet for Healthcare Providers: quierodirigir.comhttps://www.fda.gov/media/138095/download This test is not yet approved or cleared by the [place] FDA and  has been authorized for detection and/or diagnosis of SARS-CoV-2 by FDA under an Emergency Use Authorization (EUA). This EUA will remain  in effect (meaning this test can be used) for the duration of the COVID-19 declaration under Section 56 4(b)(1) of the Act, 21 U.S.C. section  360bbb-3(b)(1), unless the authorization is terminated or revoked sooner. Performed at [facility], [address] [place], [place] [address]   Blood Culture (routine x 2)     Status: None (Preliminary result)   Collection Time: 03/14/19  9:51 AM   Specimen: BLOOD LEFT FOREARM  Result Value Ref Range Status   Specimen Description   Final    BLOOD LEFT FOREARM Performed at [facility], [address] [place], [place] [address]    Special Requests   Final    BOTTLES DRAWN AEROBIC AND ANAEROBIC Blood Culture adequate volume Performed at [facility], [address] [place], [place] [address]    Culture   Final    NO GROWTH 3 DAYS Performed at [facility], [address] [place], [place] [address]    Report Status PENDING  Incomplete  Blood Culture (routine x 2)     Status: None (Preliminary result)   Collection Time: 03/14/19  9:52 AM   Specimen: BLOOD RIGHT HAND  Result Value Ref Range Status   Specimen Description   Final    BLOOD RIGHT HAND Performed at [facility], [address] [place], [place] [address]    Special Requests   Final    BOTTLES DRAWN AEROBIC AND ANAEROBIC Blood Culture results may not be optimal due to an excessive volume of blood received in culture bottles Performed at [facility], [address] [place], [place] [address]    Culture   Final    NO GROWTH 3 DAYS Performed at [facility], [address] [place], [place] [address]    Report Status PENDING  Incomplete  Urine culture     Status: None   Collection Time: 03/15/19  6:29 AM   Specimen: In/Out Cath Urine  Result Value Ref Range Status   Specimen Description   Final    IN/OUT CATH URINE Performed at [facility], [address] [place], [place] [address]    Special Requests   Final    NONE Performed at [facility], [address] [place], [place] [address]     Culture   Final    NO GROWTH Performed at [facility], [address] [place], [place] [address]    Report Status 03/16/2019 FINAL  Final         Radiology Studies: No results found.      Scheduled Meds: . desvenlafaxine  50 mg Oral Daily  . divalproex  500 mg Oral Daily  . doxycycline  100 mg Oral Q12H  . enoxaparin (LOVENOX) injection  40 mg Subcutaneous Q24H  . lamoTRIgine  50 mg Oral BID  . levothyroxine  50 mcg Oral Q0600   Continuous Infusions:    LOS: 3 days    Time  spent: 25 minutes    Barb Merino, MD [facility] Pager [phone]

## 2019-03-18 MED ORDER — PHENOL 1.4 % MT LIQD
1.0000 | OROMUCOSAL | Status: DC | PRN
  Filled 2019-03-18: qty 177

## 2019-03-18 MED ORDER — DM-GUAIFENESIN ER 30-600 MG PO TB12
1.0000 | ORAL_TABLET | Freq: Two times a day (BID) | ORAL | Status: DC
  Administered 2019-03-18 – 2019-03-20 (×5): 1 via ORAL
  Filled 2019-03-18 (×5): qty 1

## 2019-03-18 NOTE — Progress Notes (Signed)
Physical Therapy Treatment Patient Details Name: Shannon Barry MRN: [id number] DOB: 1951-01-16 Today's Date: 03/18/2019    History of Present Illness 68 y.o. female with medical history significant for depression, seizures, hypothyroidism, TBI, was brought by EMS as patient have been having generalized weakness, cough, subjective fever/chills for the past couple of days. Pt admitted for acute metabolic encephalopathy and Sepsis present on admission due to left lower lobe pneumonia: Bacterial pneumonia suspect aspiration    PT Comments    At start of session pt in bathroom alone and reported she did not call for assistance. Pt educated on safety concerns for mobilizing without assistance and chair alarm placed on seat. Pt remains unsteady with gait and transfers and requires external support to prevent LOB. Pt initiated gait training with RW today and required cues for safe management during turns; she was able to ambulate ~300'. Patient will continue to benefit from skilled PT interventions to address impairments and continues to require 24/7 supervision for safety concerns and balance impairments. Acute PT will follow and progress as able.   Follow Up Recommendations  Supervision/Assistance - 24 hour;Home health PT     Equipment Recommendations  None recommended by PT    Recommendations for Other Services       Precautions / Restrictions Precautions Precautions: Fall Restrictions Weight Bearing Restrictions: No    Mobility  Bed Mobility               General bed mobility comments: pt OOB in bathroom alone at therapist arrival. pt reported not calling and thought she could do it herself.  Transfers Overall transfer level: Needs assistance Equipment used: None;Rolling walker (2 wheeled);1 person hand held assist Transfers: Sit to/from [facility] Sit to Stand: Min guard Stand pivot transfers: Min guard;Min assist       General transfer comment: pt  usnteady and reaching for support with grab bars in bathroom and furniture throughout hospital room when tranfering, [place] and requried for safe stand pivot transfer to prevent LOB in turning to get to bathroom sink, no physical assist required for power up wtih sit to stand  Ambulation/Gait Ambulation/Gait assistance: Min assist Gait Distance (Feet): 300 Feet Assistive device: Rolling walker (2 wheeled);1 person hand held assist Gait Pattern/deviations: Step-through pattern;Decreased stride length;Drifts right/left Gait velocity: decreased   General Gait Details: pt usnteady with gait and reaching for furniture in hospital room to find support, 1 HHA provided and min assist to steady to prevent LOB with amb in room. cues required for safe use of RW for hand placement and to maintain safe proximity to walker while mobilizing. pt remained unsteady however improved with use of RW as she no longer was reaching byond BOS for external support   Stairs             Wheelchair Mobility    Modified Rankin (Stroke Patients Only)       Balance Overall balance assessment: History of Falls;Needs assistance   Sitting balance-Leahy Scale: Good     Standing balance support: Bilateral upper extremity supported;During functional activity Standing balance-Leahy Scale: Fair Standing balance comment: requiring UE support               Cognition Arousal/Alertness: Awake/alert Behavior During Therapy: WFL for tasks assessed/performed Overall Cognitive Status: History of cognitive impairments - at baseline Area of Impairment: Safety/judgement               Exercises      General Comments  Pertinent Vitals/Pain Pain Assessment: No/denies pain           PT Goals (current goals can now be found in the care plan section) Acute Rehab PT Goals Patient Stated Goal: home to walk dogs PT Goal Formulation: With patient Time For Goal Achievement: 03/29/19 Potential to Achieve  Goals: Good Progress towards PT goals: Progressing toward goals    Frequency    Min 3X/week      PT Plan Current plan remains appropriate       AM-PAC PT "6 Clicks" Mobility   Outcome Measure  Help needed turning from your back to your side while in a flat bed without using bedrails?: A Little Help needed moving from lying on your back to sitting on the side of a flat bed without using bedrails?: A Little Help needed moving to and from a bed to a chair (including a wheelchair)?: A Little Help needed standing up from a chair using your arms (e.g., wheelchair or bedside chair)?: A Little Help needed to walk in hospital room?: A Little Help needed climbing 3-5 steps with a railing? : A Lot 6 Click Score: 17    End of Session Equipment Utilized During Treatment: Gait belt Activity Tolerance: Patient tolerated treatment well Patient left: in bed;with call bell/phone within reach;with bed alarm set Nurse Communication: Mobility status PT Visit Diagnosis: Other abnormalities of gait and mobility (R26.89)     Time: 3220-2542 PT Time Calculation (min) (ACUTE ONLY): 22 min  Charges:  $Gait Training: 8-22 mins                    Valentino Saxon, PT, DPT Physical Therapist with [facility]  03/18/2019 1:00 PM

## 2019-03-18 NOTE — Progress Notes (Signed)
Occupational Therapy Treatment Patient Details Name: Shannon Barry MRN: [id number] DOB: 1950/12/22 Today's Date: 03/18/2019    History of present illness 68 y.o. female with medical history significant for depression, seizures, hypothyroidism, TBI, was brought by EMS as patient have been having generalized weakness, cough, subjective fever/chills for the past couple of days. Pt admitted for acute metabolic encephalopathy and Sepsis present on admission due to left lower lobe pneumonia: Bacterial pneumonia suspect aspiration   OT comments  Pt much clearer this day. Pt verbalized she misses her dog.  Pt overall S- min A with ADL activity this day  Follow Up Recommendations  SNF;Home health OT;Supervision/Assistance - 24 hour(depending on families A)    Equipment Recommendations  None recommended by OT    Recommendations for Other Services      Precautions / Restrictions Precautions Precautions: Fall Restrictions Weight Bearing Restrictions: No       Mobility Bed Mobility               General bed mobility comments: pt OOB  Transfers Overall transfer level: Needs assistance Equipment used: Rolling walker (2 wheeled) Transfers: Sit to/from [facility] Sit to Stand: Supervision Stand pivot transfers: Supervision       General transfer comment: verbal cues for safe technique    Balance Overall balance assessment: History of Falls;Needs assistance   Sitting balance-Leahy Scale: Good     Standing balance support: Bilateral upper extremity supported Standing balance-Leahy Scale: Fair Standing balance comment: requiring UE support                           ADL either performed or assessed with clinical judgement   ADL Overall ADL's : Needs assistance/impaired     Grooming: Wash/dry face;Brushing hair;Standing;Cueing for safety;Supervision/safety               Lower Body Dressing: Supervision/safety;Sit to/from stand;Cueing for  safety;Cueing for compensatory techniques;Cueing for sequencing   Toilet Transfer: Stand-pivot;Cueing for safety;Cueing for sequencing;BSC;Supervision/safety   Toileting- Clothing Manipulation and Hygiene: Supervision/safety;Sit to/from stand;Cueing for safety;Cueing for compensatory techniques       Functional mobility during ADLs: Supervision/safety;Rolling walker General ADL Comments: Pt much clearer this day and overall S with ADL activity. Pt very pleasant     Vision Patient Visual Report: No change from baseline            Cognition Arousal/Alertness: Awake/alert Behavior During Therapy: WFL for tasks assessed/performed Overall Cognitive Status: History of cognitive impairments - at baseline                                                     Pertinent Vitals/ Pain       Pain Assessment: No/denies pain         Frequency  Min 2X/week        Progress Toward Goals  OT Goals(current goals can now be found in the care plan section)  Progress towards OT goals: Progressing toward goals     Plan Discharge plan remains appropriate       AM-PAC OT "6 Clicks" Daily Activity     Outcome Measure   Help from another person eating meals?: None Help from another person taking care of personal grooming?: None Help from another person toileting, which includes using toliet, bedpan, or urinal?: A  Little Help from another person bathing (including washing, rinsing, drying)?: A Little Help from another person to put on and taking off regular upper body clothing?: None Help from another person to put on and taking off regular lower body clothing?: A Little 6 Click Score: 21    End of Session Equipment Utilized During Treatment: Gait belt  OT Visit Diagnosis: Unsteadiness on feet (R26.81)   Activity Tolerance Patient limited by fatigue   Patient Left in chair;with call bell/phone within reach   Nurse Communication Mobility status         Time: 2751-7001 OT Time Calculation (min): 12 min  Charges: OT General Charges $OT Visit: 1 Visit OT Treatments $Self Care/Home Management : 8-22 mins  Lise Auer, OT Acute Rehabilitation Services [phone] Office- [phone]      Kelsee Preslar, Karin Golden D 03/18/2019, 11:42 AM

## 2019-03-18 NOTE — Progress Notes (Signed)
SLP Cancellation Note  Patient Details Name: Shannon Barry MRN: [id number] DOB: 1950-09-22   Cancelled treatment:       Reason Eval/Treat Not Completed: (pt receiving care from NT, RN reports pt tolerating po well, will continue efforts)   [name] 03/18/2019, 4:20 PM  [name], [facility] SLP Acute Rehab Services Pager [phone] Office [phone]

## 2019-03-18 NOTE — Progress Notes (Signed)
PROGRESS NOTE    Shannon Barry  ZOX:[id number] DOB: May 23, 1951 DOA: 03/14/2019 PCP: Florentina Jenny, MD    Brief Narrative:  Shannon Barry is a 68 y.o. female with medical history significant for depression, seizures, hypothyroidism, TBI, was brought by EMS as patient have been having generalized weakness, cough, subjective fever/chills for the past couple of days.  Patient also noted to be more confused/agitated over the past couple of days as well.  EDP spoke to patient's son who noted patient has been more confused, overall very weak for the past 3 days.   In the emergency, patient was confused and delirious, afebrile, hypotensive which was responsive to IV fluids, currently saturating well on room air, lactic acid 2.3 on admission, sodium 126, hemoglobin of 10.4.  CT head done showed no acute infarct, postoperative change right posterior parietal region with mild encephalomalacia.  Chest x-ray showed confluent airspace consolidation in the left lower lung field concerning for pneumonia.  COVID-19 negative.  Patient admitted for further management.   Assessment & Plan:   Principal Problem:   Acute metabolic encephalopathy Active Problems:   Dementia with behavioral disturbance (HCC)   Post-traumatic seizures (HCC)   TBI (traumatic brain injury) (HCC)   Mixed bipolar I disorder (HCC)   History of alcohol abuse   Alzheimer's disease (HCC)   Pneumonia  Sepsis present on admission due to left lower lobe pneumonia: Bacterial pneumonia suspected aspiration.  Seen by speech therapy.  No overt evidence of aspiration. Blood cultures and urine cultures negative so far.  Unable to produce sputum.  COVID-19 negative. Penicillin allergy.  Initially treated with vancomycin and cefepime.  Vancomycin discontinued.  Clinically stabilized. Will start on oral doxycycline for 3 more days to finish total 5 days of therapy. Cough and deep breathing exercises, incentive spirometry,  Acute metabolic  encephalopathy in the setting of traumatic brain injury and posttraumatic seizure disorders: Metabolic infective encephalopathy.  No focal deficit.  CT head normal. Continue close monitoring.  Delirium precautions.  Fall precautions. Patient is on multiple seizure medication and mood stabilizer including Pristiq, Depakote, lamotrigine that she will continue. Her mental status has improved.  Hypochloremic hyponatremia: Resuscitated with isotonic saline with improvement.  Corrected  Hypothyroidism: TSH is suppressed to less than 0.2.  Decreased thyroxine to 50 mcg daily.  Physical deconditioning: Patient with profound physical deconditioning.  Work with PT OT.  She will benefit with inpatient therapies.  Social worker on case.  DVT prophylaxis: Lovenox subcu Code Status: Full code Family Communication: Patient's son [name]. no adequate support at home for 24/7, recommended a skilled nursing facility placement. Disposition Plan: SNF when bed is available.    Consultants:   None  Procedures:   None  Antimicrobials:   Vancomycin, 03/14/2019--03/16/2019  Cefepime, 03/14/2019--- 03/17/2019  Doxycycline, 03/17/2019---   Subjective: Patient was seen and examined.  No overnight events.  Patient stated early morning she had episode of congestion and shortness of breath.  On examination she is normal.  She is on room air.  No more confusion.  Ambulating more with nursing help.  Objective: Vitals:   03/17/19 0616 03/17/19 1239 03/17/19 2159 03/18/19 0520  BP: 136/81 135/77 (!) 143/73 130/78  Pulse: 84 78 70 80  Resp: 16 18 17 17   Temp: 98.2 F (36.8 C) (!) 97.5 F (36.4 C) (!) 97.3 F (36.3 C) (!) 97.5 F (36.4 C)  TempSrc: Oral Oral Oral Oral  SpO2: 92% (!) 89% 92% 94%  Weight:      Height:  Intake/Output Summary (Last 24 hours) at 03/18/2019 1322 Last data filed at 03/18/2019 0800 Gross per 24 hour  Intake 480 ml  Output 1 ml  Net 479 ml   Filed Weights    03/14/19 0844  Weight: 52.2 kg    Examination:  General exam: Appears calm and comfortable. On room air. Respiratory system: Clear to auscultation. Respiratory effort normal.  No added sounds. Cardiovascular system: S1 & S2 heard, RRR. No JVD, murmurs, rubs, gallops or clicks. No pedal edema. Gastrointestinal system: Abdomen is nondistended, soft and nontender. No organomegaly or masses felt. Normal bowel sounds heard. Central nervous system: Alert and oriented.  No focal neurological deficits. Extremities: Symmetric 5 x 5 power. Skin: No rashes, lesions or ulcers Psychiatry: Judgement and insight appear normal.      Data Reviewed: I have personally reviewed following labs and imaging studies  CBC: Recent Labs  Lab 03/14/19 0951 03/15/19 0549 03/16/19 0808  WBC 7.8 7.6 7.9  NEUTROABS 5.9  --  5.0  HGB 10.4* 10.6* 11.5*  HCT 30.0* 31.7* 32.3*  MCV 88.8 91.1 86.6  PLT 214 253 212   Basic Metabolic Panel: Recent Labs  Lab 03/14/19 0951 03/15/19 0549 03/16/19 0808  NA 126* 137 137  K 3.3* 3.9 4.4  CL 94* 108 107  CO2 22 21* 20*  GLUCOSE 148* 135* 91  BUN 9 10 9   CREATININE 0.53 0.49 0.38*  CALCIUM 8.0* 8.2* 8.0*  MG 2.0  --  1.8  PHOS  --   --  2.4*   GFR: Estimated Creatinine Clearance: 57 mL/min (A) (by C-G formula based on SCr of 0.38 mg/dL (L)). Liver Function Tests: Recent Labs  Lab 03/14/19 0951  AST 21  ALT 19  ALKPHOS 47  BILITOT 0.5  PROT 5.4*  ALBUMIN 2.3*   No results for input(s): LIPASE, AMYLASE in the last 168 hours. Recent Labs  Lab 03/14/19 0932  AMMONIA 44*   Coagulation Profile: No results for input(s): INR, PROTIME in the last 168 hours. Cardiac Enzymes: No results for input(s): CKTOTAL, CKMB, CKMBINDEX, TROPONINI in the last 168 hours. BNP (last 3 results) No results for input(s): PROBNP in the last 8760 hours. HbA1C: No results for input(s): HGBA1C in the last 72 hours. CBG: No results for input(s): GLUCAP in the last 168  hours. Lipid Profile: No results for input(s): CHOL, HDL, LDLCALC, TRIG, CHOLHDL, LDLDIRECT in the last 72 hours. Thyroid Function Tests: No results for input(s): TSH, T4TOTAL, FREET4, T3FREE, THYROIDAB in the last 72 hours. Anemia Panel: No results for input(s): VITAMINB12, FOLATE, FERRITIN, TIBC, IRON, RETICCTPCT in the last 72 hours. Sepsis Labs: Recent Labs  Lab 03/14/19 0904 03/14/19 1652  LATICACIDVEN 2.3* 1.3    Recent Results (from the past 240 hour(s))  SARS CORONAVIRUS 2 (TAT 6-24 HRS) Nasopharyngeal Nasopharyngeal Swab     Status: None   Collection Time: 03/14/19  9:48 AM   Specimen: Nasopharyngeal Swab  Result Value Ref Range Status   SARS Coronavirus 2 NEGATIVE NEGATIVE Final    Comment: (NOTE) SARS-CoV-2 target nucleic acids are NOT DETECTED. The SARS-CoV-2 RNA is generally detectable in upper and lower respiratory specimens during the acute phase of infection. Negative results do not preclude SARS-CoV-2 infection, do not rule out co-infections with other pathogens, and should not be used as the sole basis for treatment or other patient management decisions. Negative results must be combined with clinical observations, patient history, and epidemiological information. The expected result is Negative. Fact Sheet for Patients: HairSlick.nohttps://www.fda.gov/media/138098/download  Fact Sheet for Healthcare Providers: https://www.woods-mathews.com/ This test is not yet approved or cleared by the [place] FDA and  has been authorized for detection and/or diagnosis of SARS-CoV-2 by FDA under an Emergency Use Authorization (EUA). This EUA will remain  in effect (meaning this test can be used) for the duration of the COVID-19 declaration under Section 56 4(b)(1) of the Act, 21 U.S.C. section 360bbb-3(b)(1), unless the authorization is terminated or revoked sooner. Performed at [facility], [place] [address], [place], [place] [address]   Blood Culture  (routine x 2)     Status: None (Preliminary result)   Collection Time: 03/14/19  9:51 AM   Specimen: BLOOD LEFT FOREARM  Result Value Ref Range Status   Specimen Description   Final    BLOOD LEFT FOREARM Performed at [facility], [place] [address], [place], [place] [address]    Special Requests   Final    BOTTLES DRAWN AEROBIC AND ANAEROBIC Blood Culture adequate volume Performed at [facility] [address] [place], [place] [address]    Culture   Final    NO GROWTH 4 DAYS Performed at [facility], [place] [address] [place], [place] [address]    Report Status PENDING  Incomplete  Blood Culture (routine x 2)     Status: None (Preliminary result)   Collection Time: 03/14/19  9:52 AM   Specimen: BLOOD RIGHT HAND  Result Value Ref Range Status   Specimen Description   Final    BLOOD RIGHT HAND Performed at [facility] [address] [place], [place] [address]    Special Requests   Final    BOTTLES DRAWN AEROBIC AND ANAEROBIC Blood Culture results may not be optimal due to an excessive volume of blood received in culture bottles Performed at [facility] [address] [place], [place] [address]    Culture   Final    NO GROWTH 4 DAYS Performed at [facility], [place] [address] [place], [place] [address]    Report Status PENDING  Incomplete  Urine culture     Status: None   Collection Time: 03/15/19  6:29 AM   Specimen: In/Out Cath Urine  Result Value Ref Range Status   Specimen Description   Final    IN/OUT CATH URINE Performed at [facility] [address], [place], [place] [address]    Special Requests   Final    NONE Performed at [facility], [place] [address], [place], [place] [address]    Culture   Final    NO GROWTH Performed at [facility], [place] [address] [place],  [address]    Report Status 03/16/2019 FINAL  Final          Radiology Studies: No results found.      Scheduled Meds: . desvenlafaxine  50 mg Oral Daily  . dextromethorphan-guaiFENesin  1 tablet Oral BID  . divalproex  500 mg Oral Daily  . doxycycline  100 mg Oral Q12H  . enoxaparin (LOVENOX) injection  40 mg Subcutaneous Q24H  . lamoTRIgine  50 mg Oral BID  . levothyroxine  50 mcg Oral Q0600   Continuous Infusions:    LOS: 4 days    Time spent: 25 minutes    Barb Merino, MD [facility] Pager [phone]

## 2019-03-18 NOTE — TOC Initial Note (Signed)
Transition of Care [facility]) - Initial/Assessment Note    Patient Details  Name: Shannon Barry MRN: [id number] Date of Birth: [birth year]  Transition of Care (TOC) CM/SW Contact:    Joaquin Courts, RN Phone Number: 03/18/2019, 3:24 PM  Clinical Narrative:         CM spoke with patient at bedside who confirms that she is interested in SNF for rehab. Patient also had questions about ALF for rehab. CM explained the differences between ALF and SNF, patient is interested in SNF placement. Patient also reports she is in the process of getting her medicare and believes it was approved 5 days ago. CM reached out to patient's son who reports that medicare is not finalized yet as they were missing some paperwork that has now been turned in.  CM spoke with some about the potential cost of a SNF stay and the need for out of pocket payment until her insurance is finalized.  Son reports he wishes to speak with his dad about the out of pocket expenses and will inform CM if family can cover the out of pocket costs of a SNF stay.             Expected Discharge Plan: Skilled Nursing Facility Barriers to Discharge: Continued Medical Work up   Patient Goals and CMS Choice Patient states their goals for this hospitalization and ongoing recovery are:: to go to a rehab CMS Medicare.gov Compare Post Acute Care list provided to:: Patient Choice offered to / list presented to : Patient  Expected Discharge Plan and Services Expected Discharge Plan: [place]   Discharge Planning Services: CM Consult Post Acute Care Choice: [place] Living arrangements for the past 2 months: Single Family Home                 DME Arranged: N/A DME Agency: NA       HH Arranged: NA HH Agency: NA        Prior Living Arrangements/Services Living arrangements for the past 2 months: [place] Lives with:: Adult Children Patient language and need for interpreter reviewed:: Yes Do  you feel safe going back to the place where you live?: Yes      Need for Family Participation in Patient Care: Yes (Comment) Care giver support system in place?: Yes (comment)   Criminal Activity/Legal Involvement Pertinent to Current Situation/Hospitalization: No - Comment as needed  Activities of Daily Living Home Assistive Devices/Equipment: Eyeglasses, Cane (specify quad or straight), Walker (specify type)(single point cane, rolling walker) ADL Screening (condition at time of admission) Patient's cognitive ability adequate to safely complete daily activities?: No Is the patient deaf or have difficulty hearing?: No Does the patient have difficulty seeing, even when wearing glasses/contacts?: No Does the patient have difficulty concentrating, remembering, or making decisions?: Yes Patient able to express need for assistance with ADLs?: Yes Does the patient have difficulty dressing or bathing?: No Independently performs ADLs?: No Communication: Independent Dressing (OT): Needs assistance Is this a change from baseline?: Change from baseline, expected to last >3 days Grooming: Needs assistance Is this a change from baseline?: Change from baseline, expected to last >3 days Feeding: Needs assistance Is this a change from baseline?: Change from baseline, expected to last >3 days Bathing: Needs assistance Is this a change from baseline?: Change from baseline, expected to last >3 days Toileting: Dependent Is this a change from baseline?: Change from baseline, expected to last >3days In/Out Bed: Dependent Is this a change from  baseline?: Change from baseline, expected to last >3 days Walks in Home: Dependent Is this a change from baseline?: Change from baseline, expected to last >3 days Does the patient have difficulty walking or climbing stairs?: Yes(secondary to weakness) Weakness of Legs: Both Weakness of Arms/Hands: None  Permission Sought/Granted   Permission granted to share  information with : Yes, Verbal Permission Granted  Share Information with NAME: Casimiro Needle     Permission granted to share info w Relationship: son     Emotional Assessment Appearance:: Appears stated age Attitude/Demeanor/Rapport: Engaged Affect (typically observed): Accepting Orientation: : Oriented to Self, Oriented to Place, Oriented to Situation   Psych Involvement: No (comment)  Admission diagnosis:  Hyponatremia [E87.1] Hypotension, unspecified hypotension type [I95.9] Community acquired pneumonia of left lung, unspecified part of lung [J18.9] Pneumonia [J18.9] Patient Active Problem List   Diagnosis Date Noted  . Pneumonia 03/15/2019  . Acute metabolic encephalopathy 03/14/2019  . 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 History of alcohol abuse 11/10/2015  . Alzheimer's disease (HCC) 11/10/2015  . 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 Alcohol abuse 09/24/2015  . OCD (obsessive compulsive disorder) 09/24/2015  . Hyponatremia 07/11/2015  . Chest pain 07/11/2015  . Nausea and vomiting 07/11/2015  . Seizures (HCC) 07/11/2015  . Vertigo   . 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 TBI (traumatic brain injury) (HCC) 05/04/2015  . 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 Dementia with behavioral disturbance (HCC) 03/18/2015  . 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 [place] [address] Phone: [phone] Fax: [phone]     Social Determinants of Health (SDOH) Interventions    Readmission Risk Interventions No flowsheet data found.

## 2019-03-19 LAB — CULTURE, BLOOD (ROUTINE X 2)
Culture: NO GROWTH
Culture: NO GROWTH
Special Requests: ADEQUATE

## 2019-03-19 LAB — SARS CORONAVIRUS 2 (TAT 6-24 HRS): SARS Coronavirus 2: NEGATIVE

## 2019-03-19 NOTE — Progress Notes (Signed)
PROGRESS NOTE    Shannon Barry  ZOX:[id number] DOB: [birth year] DOA: 03/14/2019 PCP: Florentina Jennyripp, [place], MD    Brief Narrative:  Shannon Barry is a 68 y.o. female with medical history significant for depression, seizures, hypothyroidism, TBI, was brought by EMS as patient have been having generalized weakness, cough, subjective fever/chills for the past couple of days.  Patient also noted to be more confused/agitated over the past couple of days as well. In the emergency, patient was confused and delirious, afebrile, hypotensive which was responsive to IV fluids, currently saturating well on room air, lactic acid 2.3 on admission, sodium 126, hemoglobin of 10.4.  CT head done showed no acute infarct, postoperative change right posterior parietal region with mild encephalomalacia.  Chest x-ray showed confluent airspace consolidation in the left lower lung field concerning for pneumonia.  COVID-19 negative.  Patient admitted for further management.   Assessment & Plan:   Principal Problem:   Acute metabolic encephalopathy Active Problems:   Dementia with behavioral disturbance (HCC)   Post-traumatic seizures (HCC)   TBI (traumatic brain injury) (HCC)   Mixed bipolar I disorder (HCC)   History of alcohol abuse   Alzheimer's disease (HCC)   Pneumonia  Sepsis present on admission due to left lower lobe pneumonia: Bacterial pneumonia suspected aspiration.  Seen by speech therapy.  No overt evidence of aspiration. Blood cultures and urine cultures negative so far.  Unable to produce sputum.  COVID-19 negative. Penicillin allergy.  Initially treated with vancomycin and cefepime.  Vancomycin discontinued.  Clinically stabilized. Doxycycline day 3/3 today. Cough and deep breathing exercises, incentive spirometry,  Acute metabolic encephalopathy in the setting of traumatic brain injury and posttraumatic seizure disorders: Metabolic infective encephalopathy.  No focal deficit.  CT head normal. Patient is on  multiple seizure medication and mood stabilizer including Pristiq, Depakote, lamotrigine that she will continue. Her mental status has improved and normalized.  Hypochloremic hyponatremia: Resuscitated with isotonic saline with improvement.  Corrected  Hypothyroidism: TSH is suppressed to less than 0.2.  Decreased thyroxine to 50 mcg daily.  Physical deconditioning: Patient with profound physical deconditioning.  Work with PT OT.  She will benefit with inpatient therapies.  Social worker on case. Waiting for SNF bed.   DVT prophylaxis: Lovenox subcu Code Status: Full code Family Communication: Patient's son Shannon Barry. no adequate support at home for 24/7, recommended a skilled nursing facility placement. Either SNF or arrange 24 hour care at home. Disposition Plan: pending SNF availability vs home with 24/7 initial care.   Consultants:   None  Procedures:   None  Antimicrobials:   Vancomycin, 03/14/2019--03/16/2019  Cefepime, 03/14/2019--- 03/17/2019  Doxycycline, 03/17/2019---   Subjective: Patient was seen and examined. No overnight events. No fever, cough or SOB. She was excited to tell me that she was able to walk more with the help of nursing staff.  Objective: Vitals:   03/18/19 0520 03/19/19 0042 03/19/19 0446 03/19/19 0500  BP: 130/78 (!) 142/80 134/82   Pulse: 80 80 72   Resp: 17 17 17    Temp: (!) 97.5 F (36.4 C) 98 F (36.7 C) 97.7 F (36.5 C)   TempSrc: Oral Oral Oral   SpO2: 94% 97% 91%   Weight:    55.4 kg  Height:       No intake or output data in the 24 hours ending 03/19/19 1043 Filed Weights   03/14/19 0844 03/19/19 0500  Weight: 52.2 kg 55.4 kg    Examination:  General exam: Appears calm and comfortable. On  room air. Respiratory system: Clear to auscultation. Respiratory effort normal.  No added sounds. Cardiovascular system: S1 & S2 heard, RRR. No JVD, murmurs, rubs, gallops or clicks. No pedal edema. Gastrointestinal system: Abdomen is  nondistended, soft and nontender. No organomegaly or masses felt. Normal bowel sounds heard. Central nervous system: Alert and oriented.  No focal neurological deficits. Extremities: Symmetric 5 x 5 power. Skin: No rashes, lesions or ulcers Psychiatry: Judgement and insight appear normal.      Data Reviewed: I have personally reviewed following labs and imaging studies  CBC: Recent Labs  Lab 03/14/19 0951 03/15/19 0549 03/16/19 0808  WBC 7.8 7.6 7.9  NEUTROABS 5.9  --  5.0  HGB 10.4* 10.6* 11.5*  HCT 30.0* 31.7* 32.3*  MCV 88.8 91.1 86.6  PLT 214 253 932   Basic Metabolic Panel: Recent Labs  Lab 03/14/19 0951 03/15/19 0549 03/16/19 0808  NA 126* 137 137  K 3.3* 3.9 4.4  CL 94* 108 107  CO2 22 21* 20*  GLUCOSE 148* 135* 91  BUN 9 10 9   CREATININE 0.53 0.49 0.38*  CALCIUM 8.0* 8.2* 8.0*  MG 2.0  --  1.8  PHOS  --   --  2.4*   GFR: Estimated Creatinine Clearance: 57.2 mL/min (A) (by C-G formula based on SCr of 0.38 mg/dL (L)). Liver Function Tests: Recent Labs  Lab 03/14/19 0951  AST 21  ALT 19  ALKPHOS 47  BILITOT 0.5  PROT 5.4*  ALBUMIN 2.3*   No results for input(s): LIPASE, AMYLASE in the last 168 hours. Recent Labs  Lab 03/14/19 0932  AMMONIA 44*   Coagulation Profile: No results for input(s): INR, PROTIME in the last 168 hours. Cardiac Enzymes: No results for input(s): CKTOTAL, CKMB, CKMBINDEX, TROPONINI in the last 168 hours. BNP (last 3 results) No results for input(s): PROBNP in the last 8760 hours. HbA1C: No results for input(s): HGBA1C in the last 72 hours. CBG: No results for input(s): GLUCAP in the last 168 hours. Lipid Profile: No results for input(s): CHOL, HDL, LDLCALC, TRIG, CHOLHDL, LDLDIRECT in the last 72 hours. Thyroid Function Tests: No results for input(s): TSH, T4TOTAL, FREET4, T3FREE, THYROIDAB in the last 72 hours. Anemia Panel: No results for input(s): VITAMINB12, FOLATE, FERRITIN, TIBC, IRON, RETICCTPCT in the last 72  hours. Sepsis Labs: Recent Labs  Lab 03/14/19 0904 03/14/19 1652  LATICACIDVEN 2.3* 1.3    Recent Results (from the past 240 hour(s))  SARS CORONAVIRUS 2 (TAT 6-24 HRS) Nasopharyngeal Nasopharyngeal Swab     Status: None   Collection Time: 03/14/19  9:48 AM   Specimen: Nasopharyngeal Swab  Result Value Ref Range Status   SARS Coronavirus 2 NEGATIVE NEGATIVE Final    Comment: (NOTE) SARS-CoV-2 target nucleic acids are NOT DETECTED. The SARS-CoV-2 RNA is generally detectable in upper and lower respiratory specimens during the acute phase of infection. Negative results do not preclude SARS-CoV-2 infection, do not rule out co-infections with other pathogens, and should not be used as the sole basis for treatment or other patient management decisions. Negative results must be combined with clinical observations, patient history, and epidemiological information. The expected result is Negative. Fact Sheet for Patients: SugarRoll.be Fact Sheet for Healthcare Providers: https://www.woods-mathews.com/ This test is not yet approved or cleared by the [place] FDA and  has been authorized for detection and/or diagnosis of SARS-CoV-2 by FDA under an Emergency Use Authorization (EUA). This EUA will remain  in effect (meaning this test can be used) for the duration  of the COVID-19 declaration under Section 56 4(b)(1) of the Act, 21 U.S.C. section 360bbb-3(b)(1), unless the authorization is terminated or revoked sooner. Performed at [facility], [address], [place], [place] [address]   Blood Culture (routine x 2)     Status: None   Collection Time: 03/14/19  9:51 AM   Specimen: BLOOD LEFT FOREARM  Result Value Ref Range Status   Specimen Description   Final    BLOOD LEFT FOREARM Performed at [facility], [address] [place], [place] [address]    Special Requests   Final    BOTTLES DRAWN AEROBIC AND ANAEROBIC Blood  Culture adequate volume Performed at [facility], [address] [place], [place] [address]    Culture   Final    NO GROWTH 5 DAYS Performed at [facility], [address], [place], [place] [address]    Report Status 03/19/2019 FINAL  Final  Blood Culture (routine x 2)     Status: None   Collection Time: 03/14/19  9:52 AM   Specimen: BLOOD RIGHT HAND  Result Value Ref Range Status   Specimen Description   Final    BLOOD RIGHT HAND Performed at [facility], [address], [place], [place] [address]    Special Requests   Final    BOTTLES DRAWN AEROBIC AND ANAEROBIC Blood Culture results may not be optimal due to an excessive volume of blood received in culture bottles Performed at [facility], [address] [place], [place] [address]    Culture   Final    NO GROWTH 5 DAYS Performed at [facility], [address], [place], [place] [address]    Report Status 03/19/2019 FINAL  Final  Urine culture     Status: None   Collection Time: 03/15/19  6:29 AM   Specimen: In/Out Cath Urine  Result Value Ref Range Status   Specimen Description   Final    IN/OUT CATH URINE Performed at [facility], [address] [place], [place] [address]    Special Requests   Final    NONE Performed at [facility], [address] [place], [place] [address]    Culture   Final    NO GROWTH Performed at [facility], [address], [place], [place] [address]    Report Status 03/16/2019 FINAL  Final         Radiology Studies: No results found.      Scheduled Meds: . desvenlafaxine  50 mg Oral Daily  . dextromethorphan-guaiFENesin  1 tablet Oral BID  . divalproex  500 mg Oral Daily  . doxycycline  100 mg Oral Q12H  . enoxaparin (LOVENOX) injection  40 mg Subcutaneous Q24H  . lamoTRIgine  50 mg Oral BID  . levothyroxine  50 mcg Oral Q0600   Continuous Infusions:     LOS: 5 days    Time spent: 25 minutes    Dorcas Carrow, MD [facility] Pager [phone]

## 2019-03-19 NOTE — TOC Progression Note (Addendum)
Transition of Care [facility]) - Progression Note    Patient Details  Name: Shannon Barry MRN: [id number] Date of Birth: 28-Sep-1950  Transition of Care [facility]) CM/SW Contact  Joaquin Courts, RN Phone Number: 03/19/2019, 2:28 PM  Clinical Narrative:    [facility] extended a bed offer. CM notified son if offer and the need for up front payment for room and board for 30-days which the facility is estimating at 8400 dollars plus additional expenses for cost of therapy and medications. Son was provided with contact for [facility] rep to discuss payment and sign any pertinent paperwork. Son to communicate with CM once arrangements have been made. Of note, patient's Covid test is pending. Facility does need a negative covid within 3 days of discharge.    Expected Discharge Plan: [place] Barriers to Discharge: Continued Medical Work up  Expected Discharge Plan and Services Expected Discharge Plan: [place]   Discharge Planning Services: CM Consult Post Acute Care Choice: [place] Living arrangements for the past 2 months: Single Family Home                 DME Arranged: N/A DME Agency: NA       HH Arranged: NA HH Agency: NA         Social Determinants of Health (SDOH) Interventions    Readmission Risk Interventions No flowsheet data found.

## 2019-03-19 NOTE — NC FL2 (Addendum)
[place] LEVEL OF CARE SCREENING TOOL     IDENTIFICATION  Patient Name: Shannon Barry Birthdate: [birth year] Sex: female Admission Date (Current Location): 03/14/2019  [facility] and [place] Number:  Herbalist and Address:  [facility],  [place] [address] [place]      Provider Number: [id number]  Attending Physician Name and Address:  Barb Merino, MD  Relative Name and Phone Number:       Current Level of Care: Hospital Recommended Level of Care: [place] Prior Approval Number:    Date Approved/Denied:   PASRR Number: [id number]  Discharge Plan: SNF    Current Diagnoses: Patient Active Problem List   Diagnosis Date Noted  . Pneumonia 03/15/2019  . Acute metabolic encephalopathy 56/81/2751  . 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 History of alcohol abuse 11/10/2015  . Alzheimer's disease ([place]) 11/10/2015  . Mixed bipolar I disorder ([place]) 09/24/2015  . Alcohol abuse 09/24/2015  . OCD (obsessive compulsive disorder) 09/24/2015  . Hyponatremia 07/11/2015  . Chest pain 07/11/2015  . Nausea and vomiting 07/11/2015  . Seizures ([place]) 07/11/2015  . Vertigo   . Post-traumatic seizures ([place]) 05/04/2015  . TBI (traumatic brain injury) ([place]) 05/04/2015  . 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 Dementia with behavioral disturbance ([place]) 03/18/2015  . 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 (traumatic brain injury) 03/18/2015    Orientation RESPIRATION BLADDER Height & Weight     Self, Time, Situation, Place  Normal Continent Weight: 55.4 kg Height:  5\' 3"  (160 cm)  BEHAVIORAL SYMPTOMS/MOOD NEUROLOGICAL BOWEL NUTRITION STATUS      Continent Diet  AMBULATORY STATUS COMMUNICATION OF NEEDS Skin   Extensive Assist Verbally Normal                       Personal Care Assistance Level of Assistance  Bathing, Dressing Bathing  Assistance: Maximum assistance   Dressing Assistance: Limited assistance     Functional Limitations Info             SPECIAL CARE FACTORS FREQUENCY  PT (By licensed PT), OT (By licensed OT)     PT Frequency: 5x weekly OT Frequency: 5x weekly            Contractures Contractures Info: Not present    Additional Factors Info  Code Status, Allergies Code Status Info: Full Allergies Info: penicillins, sulfa antibiotics           Current Medications (03/19/2019):  This is the current hospital active medication list Current Facility-Administered Medications  Medication Dose Route Frequency Provider Last Rate Last Dose  . acetaminophen (TYLENOL) tablet 650 mg  650 mg Oral Q6H PRN Alma Friendly, MD       Or  . acetaminophen (TYLENOL) suppository 650 mg  650 mg Rectal Q6H PRN Alma Friendly, MD      . albuterol (PROVENTIL) (2.5 MG/3ML) 0.083% nebulizer solution 2.5 mg  2.5 mg Nebulization Q2H PRN Alma Friendly, MD      . desvenlafaxine (PRISTIQ) 24 hr tablet 50 mg  50 mg Oral Daily Alma Friendly, MD   50 mg at 03/19/19 0931  . dextromethorphan-guaiFENesin (MUCINEX DM) 30-600 MG per 12 hr tablet 1 tablet  1 tablet Oral BID Barb Merino, MD   1 tablet at 03/19/19 0931  . divalproex (DEPAKOTE ER) 24 hr tablet 500 mg  500 mg Oral Daily Alma Friendly, MD  500 mg at 03/19/19 0932  . doxycycline (VIBRA-TABS) tablet 100 mg  100 mg Oral Q12H Dorcas Carrow, MD   100 mg at 03/19/19 0931  . enoxaparin (LOVENOX) injection 40 mg  40 mg Subcutaneous Q24H Briant Cedar, MD   40 mg at 03/19/19 0931  . lamoTRIgine (LAMICTAL) tablet 50 mg  50 mg Oral BID Briant Cedar, MD   50 mg at 03/19/19 0931  . levothyroxine (SYNTHROID) tablet 50 mcg  50 mcg Oral Q0600 Dorcas Carrow, MD   50 mcg at 03/19/19 0654  . ondansetron (ZOFRAN) tablet 4 mg  4 mg Oral Q6H PRN Briant Cedar, MD       Or  . ondansetron [facility]) injection 4 mg  4 mg Intravenous  Q6H PRN Briant Cedar, MD      . phenol (CHLORASEPTIC) mouth spray 1 spray  1 spray Mouth/Throat PRN Dorcas Carrow, MD      . polyethylene glycol (MIRALAX / GLYCOLAX) packet 17 g  17 g Oral Daily PRN Briant Cedar, MD         Discharge Medications: Please see discharge summary for a list of discharge medications.  Relevant Imaging Results:  Relevant Lab Results:   Additional Information SSN [id number]  Armanda Heritage, RN

## 2019-03-19 NOTE — TOC Progression Note (Signed)
Transition of Care [facility]) - Progression Note    Patient Details  Name: Shannon Barry MRN: [id number] Date of Birth: 03/20/1951  [facility]) CM/SW Contact  Joaquin Courts, RN Phone Number: 03/19/2019, 9:40 AM  Clinical Narrative:    CM followed up with patient's son regarding disposition planning. Son expresses that he will pay out of pocket for patient's stay at SNF with the hope that Medicare will reimburse him some of the cost retroactively once it is approved. FL2 faxed out to area SNF, awaiting bed offers.    Expected Discharge Plan: [place] Barriers to Discharge: Continued Medical Work up  Expected Discharge Plan and Services Expected Discharge Plan: [place]   Discharge Planning Services: CM Consult Post Acute Care Choice: [place] Living arrangements for the past 2 months: Single Family Home                 DME Arranged: N/A DME Agency: NA       HH Arranged: NA HH Agency: NA         Social Determinants of Health (SDOH) Interventions    Readmission Risk Interventions No flowsheet data found.

## 2019-03-19 NOTE — Progress Notes (Signed)
  Speech Language Pathology Treatment: Dysphagia  Patient Details Name: Shannon Barry MRN: [id number] DOB: [birth year] Today's Date: 03/19/2019 Time: 0850-0900 SLP Time Calculation (min) (ACUTE ONLY): 10 min  Assessment / Plan / Recommendation Clinical Impression  Pt fully alert and sitting at EOB brushing her hair.  "I get to leave once they find a place for me."  Pt with likely baseline mentation at this time.  She did NOT pass the 3 ounce Yale test today with 2 attempts due to needing rest, however no indication of aspiration.  SLP educated pt regarding dysphagia, aspiration precautions, using caution with mixed consistencies given her h/o "clearing her throat chronically" on her saliva and pneumonia.  Note her soft palate with erythema on initial exam last week, now much improved.  All education completed, pt denies reflux nor dysphagia.  but given her hx, advised her to use caution.  SLP to sign off.  Thanks.     HPI HPI: 68 yo female adm to [facility] with AMS, confusion- sepsis with concern for aspiration.  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      SLP Plan  All goals met       Recommendations  Diet recommendations: Regular;Thin liquid Liquids provided via: Cup;Straw Medication Administration: Whole meds with liquid Supervision: Patient able to self feed Compensations: Slow rate;Small sips/bites Postural Changes and/or Swallow Maneuvers: Seated upright 90 degrees;Upright 30-60 min after meal                Oral Care Recommendations: Oral care BID Follow up Recommendations: None SLP Visit Diagnosis: Dysphagia, unspecified (R13.10) Plan: All goals met       GO            [name], MS [facility] SLP Acute Rehab Services Pager [phone] Office [phone]   [name] 03/19/2019, 9:06 AM

## 2019-03-20 MED ORDER — DESVENLAFAXINE SUCCINATE ER 50 MG PO TB24: 50.0000 mg | ORAL_TABLET | Freq: Every day | ORAL | 0 refills | Status: DC

## 2019-03-20 MED ORDER — LEVOTHYROXINE SODIUM 50 MCG PO TABS: 50.0000 ug | ORAL_TABLET | Freq: Every day | ORAL | 0 refills | Status: DC

## 2019-03-20 MED ORDER — DIVALPROEX SODIUM ER 500 MG PO TB24: 500.0000 mg | ORAL_TABLET | Freq: Every day | ORAL | 0 refills | Status: DC

## 2019-03-20 MED ORDER — LAMOTRIGINE ER 100 MG PO TB24: 100.0000 mg | ORAL_TABLET | Freq: Every day | ORAL | 0 refills | Status: DC

## 2019-03-20 NOTE — Progress Notes (Signed)
Patient transferred to [facility] by [facility].

## 2019-03-20 NOTE — Discharge Summary (Signed)
Physician Discharge Summary  Shannon Barry NFA:[id number] DOB: Sep 26, 1950 DOA: 03/14/2019  PCP: Florentina Jenny, MD  Admit date: 03/14/2019 Discharge date: 03/20/2019  Admitted From: Home Disposition: Shannon Barry, SNF  Recommendations for Outpatient Follow-up:  1. Follow up with PCP in 1-2 weeks 2. Patient's home levothyroxine was decreased to 50 mcg p.o. daily, will need repeat TFTs in 3-4 weeks   Home Health: No Equipment/Devices: None  Discharge Condition: Stable CODE STATUS: Full code Diet recommendation: Regular diet  History of present illness:  Shannon Barry a 68 y.o.femalewith medical history significant fordepression, seizures, hypothyroidism, TBI, was brought by EMS as patient have been having generalized weakness, cough, subjective fever/chills for the past couple of days. Patient also noted to be more confused/agitated over the past couple of days as well.   In the emergency, patient was confused and delirious, afebrile, hypotensive which was responsive to IV fluids, currently saturating well on room air, lactic acid 2.3 on admission, sodium 126, hemoglobin of 10.4.CT head done showed no acute infarct, postoperative change right posterior parietal region with mild encephalomalacia. Chest x-ray showed confluent airspace consolidation in the left lower lung field concerning for pneumonia. COVID-19 negative. Patient admitted for further management.   Hospital course:  Sepsis present on admission due to left lower lobe pneumonia:  Patient presented with confusion and hypotension.  Elevated lactic acid of 2.3.  Chest x-ray notable for left lower lobe consolidation.  Covid-19/SARS-CoV-2 was negative.  Initially started on vancomycin and cefepime and transitioned to doxycycline for completion of antibiotic course.  Symptoms improved.  Currently afebrile without leukocytosis are showing well on room air.  Acute metabolic encephalopathy in the setting of traumatic brain  injury and posttraumatic seizure disorders: Etiology likely secondary to acute infectious process with pneumonia as above.  CT head without contrast without acute intracranial pathology.  Mentation improved with treatment as above.  Continue home antiepileptics and mood stabilizers with Pristiq, Depakote, Lamictal.  Hypochloremic hyponatremia:  Etiology likely secondary to hypovolemic hyponatremia secondary to dehydration and sepsis as above.  Was resuscitated with with IV fluids with normalization of her sodium level.  Sodium 137 at time of discharge.  Hypothyroidism: TSH is suppressed to less than 0.2.  Decreased thyroxine to 50 mcg daily.  Repeat TFTs in 3-4 weeks.  Physical deconditioning: Patient with profound physical deconditioning.    Evaluated with PT/OT with recommendations of SNF placement.  Discharging today to [place], SNF for further rehabilitation.  Discharge Diagnoses:  Principal Problem:   Acute metabolic encephalopathy Active Problems:   Dementia with behavioral disturbance (HCC)   Post-traumatic seizures (HCC)   TBI (traumatic brain injury) (HCC)   Mixed bipolar I disorder (HCC)   History of alcohol abuse   Alzheimer's disease [facility])    Discharge Instructions  Discharge Instructions    Call MD for:  extreme fatigue   Complete by: As directed    Call MD for:  persistant dizziness or light-headedness   Complete by: As directed    Call MD for:  persistant nausea and vomiting   Complete by: As directed    Call MD for:  severe uncontrolled pain   Complete by: As directed    Call MD for:  temperature >100.4   Complete by: As directed    Diet - low sodium heart healthy   Complete by: As directed    Increase activity slowly   Complete by: As directed      Allergies as of 03/20/2019      Reactions  Penicillins Hives   Has patient had a PCN reaction causing immediate rash, facial/tongue/throat swelling, SOB or lightheadedness with hypotension: No Has  patient had a PCN reaction causing severe rash involving mucus membranes or skin necrosis: Yes Has patient had a PCN reaction that required hospitalization No Has patient had a PCN reaction occurring within the last 10 years: Yes If all of the above answers are "NO", then may proceed with Cephalosporin use.   Sulfa Antibiotics Other (See Comments)   violently ill      Medication List    STOP taking these medications   traMADol 50 MG tablet Commonly known as: ULTRAM     TAKE these medications   desvenlafaxine 50 MG 24 hr tablet Commonly known as: PRISTIQ Take 1 tablet (50 mg total) by mouth daily. What changed: medication strength   divalproex 500 MG 24 hr tablet Commonly known as: DEPAKOTE ER Take 1 tablet (500 mg total) by mouth daily.   LamoTRIgine 100 MG Tb24 24 hour tablet Take 1 tablet (100 mg total) by mouth at bedtime.   levothyroxine 50 MCG tablet Commonly known as: SYNTHROID Take 1 tablet (50 mcg total) by mouth daily at 6 (six) AM. Start taking on: March 21, 2019 What changed:   medication strength  how much to take  when to take this      Follow-up Information    Florentina Jennyripp, Henry, MD. Call in 1 week(s).   Specialty: Family Medicine Contact information: [id number] [place] [id number] [phone]          Allergies  Allergen Reactions  . Penicillins Hives    Has patient had a PCN reaction causing immediate rash, facial/tongue/throat swelling, SOB or lightheadedness with hypotension: No Has patient had a PCN reaction causing severe rash involving mucus membranes or skin necrosis: Yes Has patient had a PCN reaction that required hospitalization No Has patient had a PCN reaction occurring within the last 10 years: Yes If all of the above answers are "NO", then may proceed with Cephalosporin use.   . Sulfa Antibiotics Other (See Comments)    violently ill    Consultations:  none   Procedures/Studies: Ct Head Wo  Contrast  Result Date: 03/14/2019 CLINICAL DATA:  Altered mental status and fever EXAM: CT HEAD WITHOUT CONTRAST TECHNIQUE: Contiguous axial images were obtained from the base of the skull through the vertex without intravenous contrast. COMPARISON:  April 18, 2017 FINDINGS: Brain: There is mild supratentorial atrophy with moderate cerebellar atrophy, stable. There is no intracranial mass, hemorrhage, extra-axial fluid collection, or midline shift. There is stable mild posterior right parietal encephalomalacia. There is no demonstrable acute infarct. Vascular: No hyperdense vessel. There is no appreciable vascular calcification. Skull: Postoperative change in the right parietal bone is stable. Bony calvarium otherwise appears intact and unchanged. Sinuses/Orbits: There is mucosal thickening in several ethmoid air cells. Other visualized paranasal sinuses are clear. Visualized orbits appear symmetric bilaterally. Other: Mastoid air cells on the left are clear. There is opacification in several inferior mastoid air cells on the right. IMPRESSION: Stable atrophy with atrophy greatest in the cerebellum. Postoperative change right posterior parietal region with mild encephalomalacia in this area. No acute infarct. Brain parenchyma otherwise appears unremarkable. No mass or hemorrhage. Mucosal thickening noted in several ethmoid air cells. Opacification in several inferior mastoid air cells on the right. Electronically Signed   By: Bretta BangWilliam  Woodruff III M.D.   On: 03/14/2019 11:45   [facility]  Result Date: 03/14/2019 CLINICAL DATA:  Cough. Increasing weakness. EXAM: PORTABLE CHEST 1 VIEW COMPARISON:  December 28, 2015 FINDINGS: Cardiomediastinal silhouette is normal. Mediastinal contours appear intact. Confluent airspace consolidation in the left lower lung field. Chronic elevation of left hemidiaphragm. Concavity of the left chest wall, likely posttraumatic. Soft tissues are grossly normal. IMPRESSION:  1. Confluent airspace consolidation in the left lower lung field, concerning for pneumonia. 2. Chronic elevation of the left hemidiaphragm, likely posttraumatic. Electronically Signed   By: Ted Mcalpine M.D.   On: 03/14/2019 09:56      Subjective: Patient seen and examined bedside, resting comfortably.  Eating breakfast.  No complaints this morning.  Ready for discharge to SNF today.  Denies headache, fever/chills/night sweats, no nausea/vomiting/diarrhea, no chest pain, no palpitations, no shortness of breath, no abdominal pain, no weakness, no fatigue, no paresthesias.  No acute events overnight per nursing staff.   Discharge Exam: Vitals:   03/19/19 2020 03/20/19 0528  BP: 138/70 (!) 144/64  Pulse: 69 74  Resp: 20 18  Temp: 98.1 F (36.7 C) 98.3 F (36.8 C)  SpO2: 97% 92%   Vitals:   03/19/19 0500 03/19/19 1239 03/19/19 2020 03/20/19 0528  BP:  129/87 138/70 (!) 144/64  Pulse:  72 69 74  Resp:  Temp:  98.2 F (36.8 C) 98.1 F (36.7 C) 98.3 F (36.8 C)  TempSrc:  Oral Oral Oral  SpO2:  98% 97% 92%  Weight: 55.4 kg     Height:        General: Pt is alert, awake, not in acute distress, thin in appearance Cardiovascular: RRR, S1/S2 +, no rubs, no gallops Respiratory: CTA bilaterally, no wheezing, no rhonchi Abdominal: Soft, NT, ND, bowel sounds + Extremities: no edema, no cyanosis    The results of significant diagnostics from this hospitalization (including imaging, microbiology, ancillary and laboratory) are listed below for reference.     Microbiology: Recent Results (from the past 240 hour(s))  SARS CORONAVIRUS 2 (TAT 6-24 HRS) Nasopharyngeal Nasopharyngeal Swab     Status: None   Collection Time: 03/14/19  9:48 AM   Specimen: Nasopharyngeal Swab  Result Value Ref Range Status   SARS Coronavirus 2 NEGATIVE NEGATIVE Final    Comment: (NOTE) SARS-CoV-2 target nucleic acids are NOT DETECTED. The SARS-CoV-2 RNA is generally detectable in upper and  lower respiratory specimens during the acute phase of infection. Negative results do not preclude SARS-CoV-2 infection, do not rule out co-infections with other pathogens, and should not be used as the sole basis for treatment or other patient management decisions. Negative results must be combined with clinical observations, patient history, and epidemiological information. The expected result is Negative. Fact Sheet for Patients: HairSlick.no Fact Sheet for Healthcare Providers: quierodirigir.com This test is not yet approved or cleared by the [place] FDA and  has been authorized for detection and/or diagnosis of SARS-CoV-2 by FDA under an Emergency Use Authorization (EUA). This EUA will remain  in effect (meaning this test can be used) for the duration of the COVID-19 declaration under Section 56 4(b)(1) of the Act, 21 U.S.C. section 360bbb-3(b)(1), unless the authorization is terminated or revoked sooner. Performed at [facility], [address] [place], [place] [address]   Blood Culture (routine x 2)     Status: None   Collection Time: 03/14/19  9:51 AM   Specimen: BLOOD LEFT FOREARM  Result Value Ref Range Status   Specimen Description   Final  BLOOD LEFT FOREARM Performed at [facility], [address] [place], [place] [address]    Special Requests   Final    BOTTLES DRAWN AEROBIC AND ANAEROBIC Blood Culture adequate volume Performed at [facility], [address], [place], [place] [address]    Culture   Final    NO GROWTH 5 DAYS Performed at [facility], [address] [place], [place] [address]    Report Status 03/19/2019 FINAL  Final  Blood Culture (routine x 2)     Status: None   Collection Time: 03/14/19  9:52 AM   Specimen: BLOOD RIGHT HAND  Result Value Ref Range Status   Specimen Description   Final    BLOOD RIGHT HAND Performed at [facility], [address] [place], [place] [address]    Special Requests   Final    BOTTLES DRAWN AEROBIC AND ANAEROBIC Blood Culture results may not be optimal due to an excessive volume of blood received in culture bottles Performed at [facility], [address] [place], [place] [address]    Culture   Final    NO GROWTH 5 DAYS Performed at [facility], [address], [place], [place] [address]    Report Status 03/19/2019 FINAL  Final  Urine culture     Status: None   Collection Time: 03/15/19  6:29 AM   Specimen: In/Out Cath Urine  Result Value Ref Range Status   Specimen Description   Final    IN/OUT CATH URINE Performed at [facility], [address], [place], [place] [address]    Special Requests   Final    NONE Performed at [facility], [address], [place], [place] [address]    Culture   Final    NO GROWTH Performed at [facility], [address], [place], [place] [address]    Report Status 03/16/2019 FINAL  Final  SARS CORONAVIRUS 2 (TAT 6-24 HRS) Nasopharyngeal Nasopharyngeal Swab     Status: None   Collection Time: 03/19/19  2:01 PM   Specimen: Nasopharyngeal Swab  Result Value Ref Range Status   SARS Coronavirus 2 NEGATIVE NEGATIVE Final    Comment: (NOTE) SARS-CoV-2 target nucleic acids are NOT DETECTED. The SARS-CoV-2 RNA is generally detectable in upper and lower respiratory specimens during the acute phase of infection. Negative results do not preclude SARS-CoV-2 infection, do not rule out co-infections with other pathogens, and should not be used as the sole basis for treatment or other patient management decisions. Negative results must be combined with clinical observations, patient history, and epidemiological information. The expected result is Negative. Fact Sheet for Patients: HairSlick.no Fact Sheet for Healthcare  Providers: quierodirigir.com This test is not yet approved or cleared by the [place] FDA and  has been authorized for detection and/or diagnosis of SARS-CoV-2 by FDA under an Emergency Use Authorization (EUA). This EUA will remain  in effect (meaning this test can be used) for the duration of the COVID-19 declaration under Section 56 4(b)(1) of the Act, 21 U.S.C. section 360bbb-3(b)(1), unless the authorization is terminated or revoked sooner. Performed at [facility], [address] [place], [place] [address]      Labs: BNP (last 3 results) No results for input(s): BNP in the last 8760 hours. Basic Metabolic Panel: Recent Labs  Lab 03/14/19 0951 03/15/19 0549 03/16/19 0808  NA 126* 137 137  K 3.3*  3.9 4.4  CL 94* 108 107  CO2 22 21* 20*  GLUCOSE 148* 135* 91  BUN CREATININE 0.53 0.49 0.38*  CALCIUM 8.0* 8.2* 8.0*  MG 2.0  --  1.8  PHOS  --   --  2.4*   Liver Function Tests: Recent Labs  Lab 03/14/19 0951  AST 21  ALT 19  ALKPHOS 47  BILITOT 0.5  PROT 5.4*  ALBUMIN 2.3*   No results for input(s): LIPASE, AMYLASE in the last 168 hours. Recent Labs  Lab 03/14/19 0932  AMMONIA 44*   CBC: Recent Labs  Lab 03/14/19 0951 03/15/19 0549 03/16/19 0808  WBC 7.8 7.6 7.9  NEUTROABS 5.9  --  5.0  HGB 10.4* 10.6* 11.5*  HCT 30.0* 31.7* 32.3*  MCV 88.8 91.1 86.6  PLT 214 253 212   Cardiac Enzymes: No results for input(s): CKTOTAL, CKMB, CKMBINDEX, TROPONINI in the last 168 hours. BNP: Invalid input(s): POCBNP CBG: No results for input(s): GLUCAP in the last 168 hours. D-Dimer No results for input(s): DDIMER in the last 72 hours. Hgb A1c No results for input(s): HGBA1C in the last 72 hours. Lipid Profile No results for input(s): CHOL, HDL, LDLCALC, TRIG, CHOLHDL, LDLDIRECT in the last 72 hours. Thyroid function studies No results for input(s): TSH, T4TOTAL, T3FREE, THYROIDAB in the last 72 hours.  Invalid  input(s): FREET3 Anemia work up No results for input(s): VITAMINB12, FOLATE, FERRITIN, TIBC, IRON, RETICCTPCT in the last 72 hours. Urinalysis    Component Value Date/Time   COLORURINE YELLOW 03/15/2019 0629   APPEARANCEUR HAZY (A) 03/15/2019 0629   LABSPEC 1.018 03/15/2019 0629   PHURINE 6.0 03/15/2019 0629   GLUCOSEU NEGATIVE 03/15/2019 0629   HGBUR NEGATIVE 03/15/2019 0629   BILIRUBINUR NEGATIVE 03/15/2019 0629   KETONESUR 20 (A) 03/15/2019 0629   PROTEINUR 30 (A) 03/15/2019 0629   NITRITE NEGATIVE 03/15/2019 0629   LEUKOCYTESUR MODERATE (A) 03/15/2019 0629   Sepsis Labs Invalid input(s): PROCALCITONIN,  WBC,  LACTICIDVEN Microbiology Recent Results (from the past 240 hour(s))  SARS CORONAVIRUS 2 (TAT 6-24 HRS) Nasopharyngeal Nasopharyngeal Swab     Status: None   Collection Time: 03/14/19  9:48 AM   Specimen: Nasopharyngeal Swab  Result Value Ref Range Status   SARS Coronavirus 2 NEGATIVE NEGATIVE Final    Comment: (NOTE) SARS-CoV-2 target nucleic acids are NOT DETECTED. The SARS-CoV-2 RNA is generally detectable in upper and lower respiratory specimens during the acute phase of infection. Negative results do not preclude SARS-CoV-2 infection, do not rule out co-infections with other pathogens, and should not be used as the sole basis for treatment or other patient management decisions. Negative results must be combined with clinical observations, patient history, and epidemiological information. The expected result is Negative. Fact Sheet for Patients: HairSlick.no Fact Sheet for Healthcare Providers: quierodirigir.com This test is not yet approved or cleared by the [place] FDA and  has been authorized for detection and/or diagnosis of SARS-CoV-2 by FDA under an Emergency Use Authorization (EUA). This EUA will remain  in effect (meaning this test can be used) for the duration of the COVID-19 declaration under  Section 56 4(b)(1) of the Act, 21 U.S.C. section 360bbb-3(b)(1), unless the authorization is terminated or revoked sooner. Performed at [facility], [address] [place], [place] [address]   Blood Culture (routine x 2)     Status: None   Collection Time: 03/14/19  9:51 AM   Specimen: BLOOD LEFT FOREARM  Result Value Ref  Range Status   Specimen Description   Final    BLOOD LEFT FOREARM Performed at [facility], [place] [address] [place], [place] [address]    Special Requests   Final    BOTTLES DRAWN AEROBIC AND ANAEROBIC Blood Culture adequate volume Performed at [place] [address], [place], [place] [address]    Culture   Final    NO GROWTH 5 DAYS Performed at [facility], [place] [address] [place], [place] [address]    Report Status 03/19/2019 FINAL  Final  Blood Culture (routine x 2)     Status: None   Collection Time: 03/14/19  9:52 AM   Specimen: BLOOD RIGHT HAND  Result Value Ref Range Status   Specimen Description   Final    BLOOD RIGHT HAND Performed at [facility] [address] [place], [place] [address]    Special Requests   Final    BOTTLES DRAWN AEROBIC AND ANAEROBIC Blood Culture results may not be optimal due to an excessive volume of blood received in culture bottles Performed at [place] [facility] [address], [place], [place] [address]    Culture   Final    NO GROWTH 5 DAYS Performed at [facility], [place] [address], [place], [place] [address]    Report Status 03/19/2019 FINAL  Final  Urine culture     Status: None   Collection Time: 03/15/19  6:29 AM   Specimen: In/Out Cath Urine  Result Value Ref Range Status   Specimen Description   Final    IN/OUT CATH URINE Performed at [facility] [address], [place], [place] [address]    Special Requests   Final    NONE Performed at [facility], [place] [address],  [place], [place] [address]    Culture   Final    NO GROWTH Performed at [facility], [place] [address] [place], [place] [address]    Report Status 03/16/2019 FINAL  Final  SARS CORONAVIRUS 2 (TAT 6-24 HRS) Nasopharyngeal Nasopharyngeal Swab     Status: None   Collection Time: 03/19/19  2:01 PM   Specimen: Nasopharyngeal Swab  Result Value Ref Range Status   SARS Coronavirus 2 NEGATIVE NEGATIVE Final    Comment: (NOTE) SARS-CoV-2 target nucleic acids are NOT DETECTED. The SARS-CoV-2 RNA is generally detectable in upper and lower respiratory specimens during the acute phase of infection. Negative results do not preclude SARS-CoV-2 infection, do not rule out co-infections with other pathogens, and should not be used as the sole basis for treatment or other patient management decisions. Negative results must be combined with clinical observations, patient history, and epidemiological information. The expected result is Negative. Fact Sheet for Patients: SugarRoll.be Fact Sheet for Healthcare Providers: https://www.woods-mathews.com/ This test is not yet approved or cleared by the [place] FDA and  has been authorized for detection and/or diagnosis of SARS-CoV-2 by FDA under an Emergency Use Authorization (EUA). This EUA will remain  in effect (meaning this test can be used) for the duration of the COVID-19 declaration under Section 56 4(b)(1) of the Act, 21 U.S.C. section 360bbb-3(b)(1), unless the authorization is terminated or revoked sooner. Performed at [facility], [place] [address] [place],  [address]      Time coordinating discharge: Over 30 minutes  SIGNED:    J [place] DO  [facility] 03/20/2019, 10:15 AM

## 2019-03-20 NOTE — Progress Notes (Signed)
Report called to Leonard Pines 

## 2019-03-20 NOTE — TOC Transition Note (Signed)
Transition of Care [facility]) - CM/SW Discharge Note   Patient Details  Name: Ayisha Pol MRN: [id number] Date of Birth: Dec 06, 1950  [facility] CM/SW Contact:  Nila Nephew, LCSW Phone Number: [phone] 03/20/2019, 11:10 AM   Clinical Narrative:   Pt admitting to [facility] SNF room 122 report [phone]  Will arrange PTAR transport this afternoon, family completing admission steps at facility at 1pm.     Final next level of care: [place] Barriers to Discharge: Barriers Resolved   Patient Goals and CMS Choice Patient states their goals for this hospitalization and ongoing recovery are:: to go to a rehab CMS Medicare.gov Compare Post Acute Care list provided to:: Patient Choice offered to / list presented to : Patient  Discharge Placement                Patient to be transferred to facility by: [place] Name of family member notified: son Legrand Como Patient and family notified of of transfer: 03/20/19  Discharge Plan and Services   Discharge Planning Services: CM Consult Post Acute Care Choice: [place]          DME Arranged: N/A DME Agency: NA       HH Arranged: NA HH Agency: NA        Social Determinants of Health (SDOH) Interventions     Readmission Risk Interventions No flowsheet data found.

## 2019-07-18 ENCOUNTER — Ambulatory Visit: Payer: PRIVATE HEALTH INSURANCE | Admitting: Family Medicine

## 2019-08-05 ENCOUNTER — Telehealth: Payer: Self-pay | Admitting: Family Medicine

## 2019-08-05 NOTE — Telephone Encounter (Signed)
1) Medication(s) Requested (by name): divalproex (DEPAKOTE ER) 500 MG 24 hr tablet   2) Pharmacy of Choice: [id number] [place], [place] - [address] [place], [place] [address]

## 2019-08-06 MED ORDER — DIVALPROEX SODIUM ER 500 MG PO TB24: 500.0000 mg | ORAL_TABLET | Freq: Every day | ORAL | 0 refills | Status: DC

## 2019-08-13 ENCOUNTER — Ambulatory Visit: Payer: Medicare Other | Admitting: Adult Health

## 2019-08-13 ENCOUNTER — Encounter: Payer: Self-pay | Admitting: Adult Health

## 2019-08-16 ENCOUNTER — Ambulatory Visit: Payer: Medicare Other | Attending: Internal Medicine

## 2019-08-16 DIAGNOSIS — Z23 Encounter for immunization: Secondary | ICD-10-CM

## 2019-08-16 NOTE — Progress Notes (Signed)
   Covid-19 Vaccination Clinic  Name:  Arianny Pun    MRN: [id number] DOB: Jul 30, 1950  08/16/2019  Ms. Corpus was observed post Covid-19 immunization for 15 minutes without incident. She was provided with Vaccine Information Sheet and instruction to access the V-Safe system.   Ms. Rieser was instructed to call 911 with any severe reactions post vaccine: Marland Kitchen Difficulty breathing  . Swelling of face and throat  . A fast heartbeat  . A bad rash all over body  . Dizziness and weakness   Immunizations Administered    Name Date Dose VIS Date Route   Moderna COVID-19 Vaccine 08/16/2019 12:02 PM 0.5 mL 04/30/2019 Intramuscular   Manufacturer: Moderna   Lot: [id number]   NDC: 85631-497-02

## 2019-08-20 ENCOUNTER — Other Ambulatory Visit: Payer: Self-pay | Admitting: Adult Health

## 2019-09-12 ENCOUNTER — Ambulatory Visit (INDEPENDENT_AMBULATORY_CARE_PROVIDER_SITE_OTHER): Payer: Self-pay | Admitting: Family Medicine

## 2019-09-12 ENCOUNTER — Other Ambulatory Visit: Payer: Self-pay

## 2019-09-12 ENCOUNTER — Telehealth: Payer: Self-pay | Admitting: *Deleted

## 2019-09-12 ENCOUNTER — Encounter: Payer: Self-pay | Admitting: Family Medicine

## 2019-09-12 VITALS — BP 109/67 | HR 71 | Temp 97.4°F | Ht 63.0 in | Wt 120.0 lb

## 2019-09-12 DIAGNOSIS — R569 Unspecified convulsions: Secondary | ICD-10-CM

## 2019-09-12 MED ORDER — LAMOTRIGINE ER 100 MG PO TB24: ORAL_TABLET | ORAL | 3 refills | Status: DC

## 2019-09-12 MED ORDER — DIVALPROEX SODIUM ER 500 MG PO TB24: 500.0000 mg | ORAL_TABLET | Freq: Every day | ORAL | 3 refills | Status: DC

## 2019-09-12 NOTE — Patient Instructions (Signed)
We will continue lamotrigine 100mg  daily and Depakote 500mg  daily   Stay well hydrated and eat a well balanced diet.   Follow up closely with PCP     Seizure, Adult A seizure is a sudden burst of abnormal electrical activity in the brain. Seizures usually last from 30 seconds to 2 minutes. They can cause many different symptoms. Usually, seizures are not harmful unless they last a long time. What are the causes? Common causes of this condition include:  Fever or infection.  Conditions that affect the brain, such as: ? A brain abnormality that you were born with. ? A brain or head injury. ? Bleeding in the brain. ? A tumor. ? Stroke. ? Brain disorders such as autism or cerebral palsy.  Low blood sugar.  Conditions that are passed from parent to child (are inherited).  Problems with substances, such as: ? Having a reaction to a drug or a medicine. ? Suddenly stopping the use of a substance (withdrawal). In some cases, the cause may not be known. A person who has repeated seizures over time without a clear cause has a condition called epilepsy. What increases the risk? You are more likely to get this condition if you have:  A family history of epilepsy.  Had a seizure in the past.  A brain disorder.  A history of head injury, lack of oxygen at birth, or strokes. What are the signs or symptoms? There are many types of seizures. The symptoms vary depending on the type of seizure you have. Examples of symptoms during a seizure include:  Shaking (convulsions).  Stiffness in the body.  Passing out (losing consciousness).  Head nodding.  Staring.  Not responding to sound or touch.  Loss of bladder control and bowel control. Some people have symptoms right before and right after a seizure happens. Symptoms before a seizure may include:  Fear.  Worry (anxiety).  Feeling like you may vomit (nauseous).  Feeling like the room is spinning (vertigo).  Feeling  like you saw or heard something before (dj vu).  Odd tastes or smells.  Changes in how you see. You may see flashing lights or spots. Symptoms after a seizure happens can include:  Confusion.  Sleepiness.  Headache.  Weakness on one side of the body. How is this treated? Most seizures will stop on their own in under 5 minutes. In these cases, no treatment is needed. Seizures that last longer than 5 minutes will usually need treatment. Treatment can include:  Medicines given through an IV tube.  Avoiding things that are known to cause your seizures. These can include medicines that you take for another condition.  Medicines to treat epilepsy.  Surgery to stop the seizures. This may be needed if medicines do not help. Follow these instructions at home: Medicines  Take over-the-counter and prescription medicines only as told by your doctor.  Do not eat or drink anything that may keep your medicine from working, such as alcohol. Activity  Do not do any activities that would be dangerous if you had another seizure, like driving or swimming. Wait until your doctor says it is safe for you to do them.  If you live in the [place] ask your local DMV (department of motor vehicles) when you can drive.  Get plenty of rest. Teaching others Teach friends and family what to do when you have a seizure. They should:  Lay you on the ground.  Protect your head and body.  Loosen any tight clothing  around your neck.  Turn you on your side.  Not hold you down.  Not put anything into your mouth.  Know whether or not you need emergency care.  Stay with you until you are better.  General instructions  Contact your doctor each time you have a seizure.  Avoid anything that gives you seizures.  Keep a seizure diary. Write down: ? What you think caused each seizure. ? What you remember about each seizure.  Keep all follow-up visits as told by your doctor. This is  important. Contact a doctor if:  You have another seizure.  You have seizures more often.  There is any change in what happens during your seizures.  You keep having seizures with treatment.  You have symptoms of being sick or having an infection. Get help right away if:  You have a seizure that: ? Lasts longer than 5 minutes. ? Is different than seizures you had before. ? Makes it harder to breathe. ? Happens after you hurt your head.  You have any of these symptoms after a seizure: ? Not being able to speak. ? Not being able to use a part of your body. ? Confusion. ? A bad headache.  You have two or more seizures in a row.  You do not wake up right after a seizure.  You get hurt during a seizure. These symptoms may be an emergency. Do not wait to see if the symptoms will go away. Get medical help right away. Call your local emergency services (911 in the [place] Do not drive yourself to the hospital. Summary  Seizures usually last from 30 seconds to 2 minutes. Usually, they are not harmful unless they last a long time.  Do not eat or drink anything that may keep your medicine from working, such as alcohol.  Teach friends and family what to do when you have a seizure.  Contact your doctor each time you have a seizure. This information is not intended to replace advice given to you by your health care provider. Make sure you discuss any questions you have with your health care provider. Document Revised: 08/03/2018 Document Reviewed: 08/03/2018 Elsevier Patient Education  [place].

## 2019-09-12 NOTE — Telephone Encounter (Signed)
Received refill request for depakote and pt not seen since 07-12-18, needs appt.  I spoke to son as well.  She is to call and make appt for future refills.

## 2019-09-12 NOTE — Progress Notes (Signed)
PATIENT: Shannon Barry DOB: [birth year]  REASON FOR VISIT: follow up HISTORY FROM: patient  Chief Complaint  Patient presents with  . Follow-up    rm 1, alone, seizures, med refill,      HISTORY OF PRESENT ILLNESS: Today 09/12/19 Shannon Barry is a 69 y.o. female here today for follow up for seizure. She continues lamotrigine ER 100mg  daily and divalproex ER 500mg  at bedtime. She is not sure but thinks she may have had a seizure. She reports defecating in her hallway at home. She does not remember the event. She just remembers waking up and seeing the a mess in the hall and her undergarments were soiled. Event was not witnessed. She does endorse being dehydrated from a bout of diarrhea. No tongue injuries, no head injuries. No tenderness or weakness of extremities. She reports tonic clonic movements with typical seizures. No other seizure activity. She usually does not miss doses of AED's but admits that she has been out of her divalproex for about 4 days.   She was admitted to the hospital on 03/14/2019. Son had called our office two days prior with concerns of stroke and advised to take her to the ER. EMS was called to her home on 10/15 due to confusion, weakness, fever and cough. She was treated for CAP, Covid negative. She had significant deconditioning and was sent to rehab at [facility]. She was infected with COVID while in rehab. She never had any symptoms and recovered. She lives in a rental home with her son. She is very happy. She feels that memory is stable. She is able to dose her own medications and performs all ADL's independently. She does not drive.   HISTORY: (copied from my note on 07/12/2018)  Shannon Barry is a 69 y.o. female here today for follow up. She reports feeling well. She is taking lamotrigine 100mg  ER and divalproex 500mg  ER every night. She denies seizure activity. She is able to perform ADL's. She has not driven in 20+ years. She is currently living with her  son who helps her with medications. She is feeling well today and without complaints.   HISTORY: (copied from Shannon Barry note on 10/31/2017)  Ms. McCloudis a 69 year old female with a history of seizures. She returns today for follow-up. She is currently on Depakote taking 500 mg extended release as well as a LamictalER100 mg daily. She denies any seizure events. She continues to live at [facility]. Reports that she is able to complete all ADLs independently. She does not operate a motor vehicle. Denies any changes in her gait or balance. Denies any falls. She returns today for evaluation.   REVIEW OF SYSTEMS: Out of a complete 14 system review of symptoms, the patient complains only of the following symptoms, seizures and all other reviewed systems are negative.  ALLERGIES: Allergies  Allergen Reactions  . Penicillins Hives    Has patient had a PCN reaction causing immediate rash, facial/tongue/throat swelling, SOB or lightheadedness with hypotension: No Has patient had a PCN reaction causing severe rash involving mucus membranes or skin necrosis: Yes Has patient had a PCN reaction that required hospitalization No Has patient had a PCN reaction occurring within the last 10 years: Yes If all of the above answers are "NO", then may proceed with Cephalosporin use.   . Sulfa Antibiotics Other (See Comments)    violently ill    HOME MEDICATIONS: Outpatient Medications Prior to Visit  Medication Sig Dispense Refill  . LamoTRIgine 100  MG TB24 24 hour tablet TAKE 1 TABLET BY MOUTH EVERYDAY AT BEDTIME 30 tablet 0  . desvenlafaxine (PRISTIQ) 50 MG 24 hr tablet Take 1 tablet (50 mg total) by mouth daily. 30 tablet 0  . levothyroxine (SYNTHROID) 50 MCG tablet Take 1 tablet (50 mcg total) by mouth daily at 6 (six) AM. 30 tablet 0  . divalproex (DEPAKOTE ER) 500 MG 24 hr tablet Take 1 tablet (500 mg total) by mouth daily. 30 tablet 0   No facility-administered medications  prior to visit.    PAST MEDICAL HISTORY: Past Medical History:  Diagnosis Date  . Allergy   . Depression   . Seizures ([place])   . Thyroid disease   . Traumatic brain injury ([place])     PAST SURGICAL HISTORY: No past surgical history on file.  FAMILY HISTORY: No family history on file.  SOCIAL HISTORY: Social History   Socioeconomic History  . Marital status: Divorced    Spouse name: Not on file  . Number of children: Not on file  . Years of education: Not on file  . Highest education level: Not on file  Occupational History  . Not on file  Tobacco Use  . Smoking status: Never Smoker  . Smokeless tobacco: Never Used  Substance and Sexual Activity  . Alcohol use: No    Alcohol/week: 0.0 standard drinks  . Drug use: No  . Sexual activity: Not on file  Other Topics Concern  . Not on file  Social History Narrative  . Not on file   Social Determinants of Health   Financial Resource Strain:   . Difficulty of Paying Living Expenses:   Food Insecurity:   . Worried About Charity fundraiser in the Last Year:   . Arboriculturist in the Last Year:   Transportation Needs:   . Film/video editor (Medical):   Marland Kitchen Lack of Transportation (Non-Medical):   Physical Activity:   . Days of Exercise per Week:   . Minutes of Exercise per Session:   Stress:   . Feeling of Stress :   Social Connections:   . Frequency of Communication with Friends and Family:   . Frequency of Social Gatherings with Friends and Family:   . Attends Religious Services:   . Active Member of Clubs or Organizations:   . Attends Archivist Meetings:   Marland Kitchen Marital Status:   Intimate Partner Violence:   . Fear of Current or Ex-Partner:   . Emotionally Abused:   Marland Kitchen Physically Abused:   . Sexually Abused:       PHYSICAL EXAM  Vitals:   09/12/19 1513  BP: 109/67  Pulse: 71  Temp: (!) 97.4 F (36.3 C)  Weight: 120 lb (54.4 kg)  Height: 5\' 3"  (1.6 m)   Body mass index is 21.26  kg/m.  Generalized: Well developed, in no acute distress  Cardiology: normal rate and rhythm, no murmur noted Respiratory: clear to auscultation bilaterally  Neurological examination  Mentation: Alert oriented to time, place, history taking. Follows all commands speech and language fluent Cranial nerve II-XII: Pupils were equal round reactive to light. Extraocular movements were full, visual field were full on confrontational test. Facial sensation and strength were normal. Uvula tongue midline. Head turning and shoulder shrug  were normal and symmetric. Motor: The motor testing reveals 5 over 5 strength of all 4 extremities. Good symmetric motor tone is noted throughout.  Sensory: Sensory testing is intact to soft touch on all  4 extremities. No evidence of extinction is noted.  Coordination: Cerebellar testing reveals good finger-nose-finger and heel-to-shin bilaterally.  Gait and station: Gait is normal.  Reflexes: Deep tendon reflexes are symmetric and normal bilaterally.   DIAGNOSTIC DATA (LABS, IMAGING, TESTING) - I reviewed patient records, labs, notes, testing and imaging myself where available.  MMSE - Mini Mental State Exam 10/31/2017 08/03/2016 04/05/2016  Orientation to time 5 4 4   Orientation to Place 5 5 3   Registration 3 3 3   Attention/ Calculation 4 3 4   Recall 3 1 3   Language- name 2 objects 2 2 2   Language- repeat 1 1 1   Language- follow 3 step command 3 3 3   Language- read & follow direction 1 1 1   Write a sentence 1 1 1   Write a sentence-comments - - -  Copy design 1 0 1  Total score 29 24 26      Lab Results  Component Value Date   WBC 7.9 03/16/2019   HGB 11.5 (L) 03/16/2019   HCT 32.3 (L) 03/16/2019   MCV 86.6 03/16/2019   PLT 212 03/16/2019      Component Value Date/Time   NA 137 03/16/2019 0808   NA 135 10/31/2017 1530   K 4.4 03/16/2019 0808   CL 107 03/16/2019 0808   CO2 20 (L) 03/16/2019 0808   GLUCOSE 91 03/16/2019 0808   BUN 9 03/16/2019 0808    BUN 6 (L) 10/31/2017 1530   CREATININE 0.38 (L) 03/16/2019 0808   CALCIUM 8.0 (L) 03/16/2019 0808   PROT 5.4 (L) 03/14/2019 0951   PROT 6.6 10/31/2017 1530   ALBUMIN 2.3 (L) 03/14/2019 0951   ALBUMIN 4.2 10/31/2017 1530   AST 21 03/14/2019 0951   ALT 19 03/14/2019 0951   ALKPHOS 47 03/14/2019 0951   BILITOT 0.5 03/14/2019 0951   BILITOT <0.2 10/31/2017 1530   GFRNONAA >60 03/16/2019 0808   GFRAA >60 03/16/2019 0808   No results found for: CHOL, HDL, LDLCALC, LDLDIRECT, TRIG, CHOLHDL No results found for: 03/16/2019 Lab Results  Component Value Date   VITAMINB12 242 03/15/2019   Lab Results  Component Value Date   TSH 0.181 (L) 03/14/2019       ASSESSMENT AND PLAN 69 y.o. year old female  has a past medical history of Allergy, Depression, Seizures (HCC), Thyroid disease, and Traumatic brain injury (HCC). here with     ICD-10-CM   1. Seizures (HCC)  R56.9 Lamotrigine level    Valproic Acid Level    Vida is doing well today. She is unsure if event 4-5 months ago was related to a seizure or if she does not remember using the bathroom in the hall. She did not have any other typical signs of seizure. No missed doses of medications at that time. No soreness, tongue/lip injuries or other injuries that would suggest possible fall or tonic clonic activity. We will update labs today. I will continue lamotrigine ER 100mg  daily and divalproex ER 500mg  daily. She will continue to see PCP for mood and hypothyroidism. Memory seems stable. She was encouraged to stay well hydrated and work on healthy lifestyle habits. She will follow up in 1 year, sooner if needed.    Orders Placed This Encounter  Procedures  . Lamotrigine level  . Valproic Acid Level     Meds ordered this encounter  Medications  . divalproex (DEPAKOTE ER) 500 MG 24 hr tablet    Sig: Take 1 tablet (500 mg total) by mouth daily.  Dispense:  90 tablet    Refill:  3    Keep appt scheduled.    Order Specific Question:    Supervising Provider    Answer:   Anson Fret [id number]  . LamoTRIgine 100 MG TB24 24 hour tablet    Sig: TAKE 1 TABLET BY MOUTH EVERYDAY AT BEDTIME    Dispense:  90 tablet    Refill:  3    Needs appointment [phone]    Order Specific Question:   Supervising Provider    Answer:   Anson Fret [[id number]]      I spent 25 minutes with the patient. 50% of this time was spent counseling and educating patient on plan of care and medications.    Shawnie Dapper, FNP-C 09/12/2019, 4:00 PM [facility] [address], [address] [place], [place] [address] [phone]

## 2019-09-13 LAB — LAMOTRIGINE LEVEL: Lamotrigine Lvl: 5.7 ug/mL (ref 2.0–20.0)

## 2019-09-13 LAB — VALPROIC ACID LEVEL: Valproic Acid Lvl: 24 ug/mL — ABNORMAL LOW (ref 50–100)

## 2019-09-16 ENCOUNTER — Telehealth: Payer: Self-pay | Admitting: *Deleted

## 2019-09-16 NOTE — Telephone Encounter (Addendum)
I spoke with the pt and discussed lab results per Amy NP. Pt states she has gotten better about taking her medication daily. She verbalized appreciation for the call.   ----- Message ----- From: Shawnie Dapper, NP Sent: 09/16/2019  10:19 AM EDT To: Guy Begin, RN  Labs are as expected. Divalproex levels low but she had been out of medication for a couple of days. Remind her to take medications daily as prescribed and avoid missed doses. TY.

## 2019-09-18 ENCOUNTER — Ambulatory Visit: Payer: Medicare Other

## 2019-09-19 ENCOUNTER — Ambulatory Visit: Payer: Self-pay | Attending: Internal Medicine

## 2019-09-19 DIAGNOSIS — Z23 Encounter for immunization: Secondary | ICD-10-CM

## 2019-09-19 NOTE — Progress Notes (Signed)
   Covid-19 Vaccination Clinic  Name:  Shannon Barry    MRN: [id number] DOB: Dec 19, 1950  09/19/2019  [name] was observed post Covid-19 immunization for 15 minutes without incident. She was provided with Vaccine Information Sheet and instruction to access the V-Safe system.   [name] was instructed to call 911 with any severe reactions post vaccine: Marland Kitchen Difficulty breathing  . Swelling of face and throat  . A fast heartbeat  . A bad rash all over body  . Dizziness and weakness   Immunizations Administered    Name Date Dose VIS Date Route   Moderna COVID-19 Vaccine 09/19/2019 11:30 AM 0.5 mL 04/2019 Intramuscular   Manufacturer: Moderna   Lot: [id number]   NDC: 29924-268-34

## 2020-03-26 ENCOUNTER — Other Ambulatory Visit: Payer: Self-pay | Admitting: *Deleted

## 2020-03-26 NOTE — Telephone Encounter (Signed)
Received request for divalproex.  I called [facility], pharmacist stated that last fill from them 09-12-19.  Was transferred to [facility] # [phone].  She noted has refills #180 tabs left on prescription.  I tried to call pt and was not able to LVM as was full.

## 2020-03-31 ENCOUNTER — Other Ambulatory Visit: Payer: Self-pay | Admitting: *Deleted

## 2020-03-31 MED ORDER — DIVALPROEX SODIUM ER 500 MG PO TB24: 500.0000 mg | ORAL_TABLET | Freq: Every day | ORAL | 1 refills | Status: DC

## 2020-09-15 ENCOUNTER — Ambulatory Visit: Payer: Self-pay | Admitting: Family Medicine

## 2020-09-15 ENCOUNTER — Other Ambulatory Visit: Payer: Self-pay

## 2020-09-15 ENCOUNTER — Encounter: Payer: Self-pay | Admitting: Family Medicine

## 2020-09-15 VITALS — BP 126/75 | HR 80 | Ht 63.0 in | Wt 122.0 lb

## 2020-09-15 DIAGNOSIS — R561 Post traumatic seizures: Secondary | ICD-10-CM

## 2020-09-15 MED ORDER — DIVALPROEX SODIUM ER 500 MG PO TB24: 500.0000 mg | ORAL_TABLET | Freq: Every day | ORAL | 3 refills | Status: DC

## 2020-09-15 MED ORDER — LAMOTRIGINE ER 100 MG PO TB24: ORAL_TABLET | ORAL | 3 refills | Status: DC

## 2020-09-15 NOTE — Progress Notes (Signed)
PATIENT: Shannon Barry DOB: [birth year]  REASON FOR VISIT: follow up HISTORY FROM: patient  Chief Complaint  Patient presents with  . Follow-up    RM 1 alone Pt is well, no seizures      HISTORY OF PRESENT ILLNESS: 09/15/20 ALL: Shannon Barry returns for follow up for seizures. She continues lamotrigine and divalproex. She is tolerating medicaitons well. No obvious adverse effects. She is followed regularly by PCP. She plans to update Dexa this year. Memory is stable. She lives in a split level home with her youngest son. Her oldest son makes sure bills are paid but she is able to manage her money, go to the grocery store, etc. She performs ADL independently. She does not drive. Rarely mises doses of medication.    09/12/2019 ALL:  Shannon Barry is a 70 y.o. female here today for follow up for seizure. She continues lamotrigine ER 100mg  daily and divalproex ER 500mg  at bedtime. She is not sure but thinks she may have had a seizure. She reports defecating in her hallway at home. She does not remember the event. She just remembers waking up and seeing the a mess in the hall and her undergarments were soiled. Event was not witnessed. She does endorse being dehydrated from a bout of diarrhea. No tongue injuries, no head injuries. No tenderness or weakness of extremities. She reports tonic clonic movements with typical seizures. No other seizure activity. She usually does not miss doses of AED's but admits that she has been out of her divalproex for about 4 days.   She was admitted to the hospital on 03/14/2019. Son had called our office two days prior with concerns of stroke and advised to take her to the ER. EMS was called to her home on 10/15 due to confusion, weakness, fever and cough. She was treated for CAP, Covid negative. She had significant deconditioning and was sent to rehab at [facility]. She was infected with COVID while in rehab. She never had any symptoms and recovered. She lives in a  rental home with her son. She is very happy. She feels that memory is stable. She is able to dose her own medications and performs all ADL's independently. She does not drive.   HISTORY: (copied from my note on 07/12/2018)  Shannon Barry is a 70 y.o. female here today for follow up. She reports feeling well. She is taking lamotrigine 100mg  ER and divalproex 500mg  ER every night. She denies seizure activity. She is able to perform ADL's. She has not driven in 20+ years. She is currently living with her son who helps her with medications. She is feeling well today and without complaints.   HISTORY: (copied from [name] note on 10/31/2017)  Shannon Barry a 70 year old female with a history of seizures. She returns today for follow-up. She is currently on Depakote taking 500 mg extended release as well as a LamictalER100 mg daily. She denies any seizure events. She continues to live at [facility]. Reports that she is able to complete all ADLs independently. She does not operate a motor vehicle. Denies any changes in her gait or balance. Denies any falls. She returns today for evaluation.   REVIEW OF SYSTEMS: Out of a complete 14 system review of symptoms, the patient complains only of the following symptoms, seizures and all other reviewed systems are negative.  ALLERGIES: Allergies  Allergen Reactions  . Penicillins Hives    Has patient had a PCN reaction causing immediate rash, facial/tongue/throat swelling,  SOB or lightheadedness with hypotension: No Has patient had a PCN reaction causing severe rash involving mucus membranes or skin necrosis: Yes Has patient had a PCN reaction that required hospitalization No Has patient had a PCN reaction occurring within the last 10 years: Yes If all of the above answers are "NO", then may proceed with Cephalosporin use.   . Sulfa Antibiotics Other (See Comments)    violently ill    HOME MEDICATIONS: Outpatient Medications Prior  to Visit  Medication Sig Dispense Refill  . desvenlafaxine (PRISTIQ) 50 MG 24 hr tablet Take 1 tablet (50 mg total) by mouth daily. 30 tablet 0  . levothyroxine (SYNTHROID) 50 MCG tablet Take 1 tablet (50 mcg total) by mouth daily at 6 (six) AM. 30 tablet 0  . divalproex (DEPAKOTE ER) 500 MG 24 hr tablet Take 1 tablet (500 mg total) by mouth daily. 90 tablet 1  . LamoTRIgine 100 MG TB24 24 hour tablet TAKE 1 TABLET BY MOUTH EVERYDAY AT BEDTIME 90 tablet 3   No facility-administered medications prior to visit.    PAST MEDICAL HISTORY: Past Medical History:  Diagnosis Date  . Allergy   . Depression   . Seizures (HCC)   . Thyroid disease   . Traumatic brain injury (HCC)     PAST SURGICAL HISTORY: History reviewed. No pertinent surgical history.  FAMILY HISTORY: History reviewed. No pertinent family history.  SOCIAL HISTORY: Social History   Socioeconomic History  . Marital status: Divorced    Spouse name: Not on file  . Number of children: Not on file  . Years of education: Not on file  . Highest education level: Not on file  Occupational History  . Not on file  Tobacco Use  . Smoking status: Never Smoker  . Smokeless tobacco: Never Used  Substance and Sexual Activity  . Alcohol use: No    Alcohol/week: 0.0 standard drinks  . Drug use: No  . Sexual activity: Not on file  Other Topics Concern  . Not on file  Social History Narrative  . Not on file   Social Determinants of Health   Financial Resource Strain: Not on file  Food Insecurity: Not on file  Transportation Needs: Not on file  Physical Activity: Not on file  Stress: Not on file  Social Connections: Not on file  Intimate Partner Violence: Not on file      PHYSICAL EXAM  Vitals:   09/15/20 1511  BP: 126/75  Pulse: 80  Weight: 122 lb (55.3 kg)  Height: 5\' 3"  (1.6 m)   Body mass index is 21.61 kg/m.  Generalized: Well developed, in no acute distress  Cardiology: normal rate and rhythm, no  murmur noted Respiratory: clear to auscultation bilaterally  Neurological examination  Mentation: Alert oriented to time, place, history taking. Follows all commands speech and language fluent Cranial nerve II-XII: Pupils were equal round reactive to light. Extraocular movements were full, visual field were full on confrontational test. Facial sensation and strength were normal. Uvula tongue midline. Head turning and shoulder shrug  were normal and symmetric. Motor: The motor testing reveals 5 over 5 strength of all 4 extremities. Good symmetric motor tone is noted throughout.  Sensory: Sensory testing is intact to soft touch on all 4 extremities. No evidence of extinction is noted.  Coordination: Cerebellar testing reveals good finger-nose-finger and heel-to-shin bilaterally.  Gait and station: Gait is normal.  Reflexes: Deep tendon reflexes are symmetric and normal bilaterally.   DIAGNOSTIC DATA (LABS, IMAGING, TESTING) -  I reviewed patient records, labs, notes, testing and imaging myself where available.  MMSE - Mini Mental State Exam 10/31/2017 08/03/2016 04/05/2016  Orientation to time 5 4 4   Orientation to Place 5 5 3   Registration 3 3 3   Attention/ Calculation 4 3 4   Recall 3 1 3   Language- name 2 objects 2 2 2   Language- repeat 1 1 1   Language- follow 3 step command 3 3 3   Language- read & follow direction 1 1 1   Write a sentence 1 1 1   Write a sentence-comments - - -  Copy design 1 0 1  Total score 29 24 26      Lab Results  Component Value Date   WBC 7.9 03/16/2019   HGB 11.5 (L) 03/16/2019   HCT 32.3 (L) 03/16/2019   MCV 86.6 03/16/2019   PLT 212 03/16/2019      Component Value Date/Time   NA 137 03/16/2019 0808   NA 135 10/31/2017 1530   K 4.4 03/16/2019 0808   CL 107 03/16/2019 0808   CO2 20 (L) 03/16/2019 0808   GLUCOSE 91 03/16/2019 0808   BUN 9 03/16/2019 0808   BUN 6 (L) 10/31/2017 1530   CREATININE 0.38 (L) 03/16/2019 0808   CALCIUM 8.0 (L) 03/16/2019 0808    PROT 5.4 (L) 03/14/2019 0951   PROT 6.6 10/31/2017 1530   ALBUMIN 2.3 (L) 03/14/2019 0951   ALBUMIN 4.2 10/31/2017 1530   AST 21 03/14/2019 0951   ALT 19 03/14/2019 0951   ALKPHOS 47 03/14/2019 0951   BILITOT 0.5 03/14/2019 0951   BILITOT <0.2 10/31/2017 1530   GFRNONAA >60 03/16/2019 0808   GFRAA >60 03/16/2019 0808   No results found for: CHOL, HDL, LDLCALC, LDLDIRECT, TRIG, CHOLHDL No results found for: 03/16/2019 Lab Results  Component Value Date   VITAMINB12 242 03/15/2019   Lab Results  Component Value Date   TSH 0.181 (L) 03/14/2019       ASSESSMENT AND PLAN 70 y.o. year old female  has a past medical history of Allergy, Depression, Seizures (HCC), Thyroid disease, and Traumatic brain injury (HCC). here with     ICD-10-CM   1. Post-traumatic seizures (HCC)  R56.1 Valproic Acid Level    Lamotrigine level     Shannon Barry is doing well today. No recent seizure activity. We will update labs today. I will continue lamotrigine ER 100mg  daily and divalproex ER 500mg  daily. She will continue to see PCP for mood and hypothyroidism. Memory seems stable. She was encouraged to stay well hydrated and work on healthy lifestyle habits. She will follow up in 1 year, sooner if needed.    Orders Placed This Encounter  Procedures  . Valproic Acid Level  . Lamotrigine level     Meds ordered this encounter  Medications  . divalproex (DEPAKOTE ER) 500 MG 24 hr tablet    Sig: Take 1 tablet (500 mg total) by mouth daily.    Dispense:  90 tablet    Refill:  3    Order Specific Question:   Supervising Provider    Answer:   03/16/2019 03/16/2019  . LamoTRIgine 100 MG TB24 24 hour tablet    Sig: TAKE 1 TABLET BY MOUTH EVERYDAY AT BEDTIME    Dispense:  90 tablet    Refill:  3    Order Specific Question:   Supervising Provider    Answer:   03/16/2019 12/31/2017      I spent 25 minutes with the  patient. 50% of this time was spent counseling and educating patient on plan of care  and medications.    Shawnie Dappermy Lindsi Bayliss, FNP-C 09/15/2020, 3:41 PM [facility] [address], [address] [place], [place] [address] [phone]

## 2020-09-15 NOTE — Patient Instructions (Addendum)
Below is our plan:  We will continue lamotrigine ER 100mg  daily and divalproex ER 500mg  daily.   Please make sure you are staying well hydrated. I recommend 50-60 ounces daily. Well balanced diet and regular exercise encouraged. Consistent sleep schedule with 6-8 hours recommended.   Please continue follow up with care team as directed.   Follow up with me in 1 year   You may receive a survey regarding today's visit. I encourage you to leave honest feed back as I do use this information to improve patient care. Thank you for seeing me today!      Memory Compensation Strategies  1. Use "WARM" strategy.  W= write it down  A= associate it  R= repeat it  M= make a mental note  2.   You can keep a .  Use a 3-ring notebook with sections for the following: calendar, important names and phone numbers,  medications, doctors' names/phone numbers, lists/reminders, and a section to journal what you did  each day.   3.    Use a calendar to write appointments down.  4.    Write yourself a schedule for the day.  This can be placed on the calendar or in a separate section of the Memory Notebook.  Keeping a  regular schedule can help memory.  5.    Use medication organizer with sections for each day or morning/evening pills.  You may need help loading it  6.    Keep a basket, or pegboard by the door.  Place items that you need to take out with you in the basket or on the pegboard.  You may also want to  include a message board for reminders.  7.    Use sticky notes.  Place sticky notes with reminders in a place where the task is performed.  For example: " turn off the  stove" placed by the stove, "lock the door" placed on the door at eye level, " take your medications" on  the bathroom mirror or by the place where you normally take your medications.  8.    Use alarms/timers.  Use while cooking to remind yourself to check on food or as a reminder to take your medicine, or as  a  reminder to make a call, or as a reminder to perform another task, etc.   Seizure, Adult A seizure is a sudden burst of abnormal electrical and chemical activity in the brain. Seizures usually last from 30 seconds to 2 minutes.  What are the causes? Common causes of this condition include:  Fever or infection.  Problems that affect the brain. These may include: ? A brain or head injury. ? Bleeding in the brain. ? A brain tumor.  Low levels of blood sugar or salt.  Kidney problems or liver problems.  Conditions that are passed from parent to child (are inherited).  Problems with a substance, such as: ? Having a reaction to a drug or a medicine. ? Stopping the use of a substance all of a sudden (withdrawal).  A stroke.  Disorders that affect how you develop. Sometimes, the cause may not be known.  What increases the risk?  Having someone in your family who has epilepsy. In this condition, seizures happen again and again over time. They have no clear cause.  Having had a tonic-clonic seizure before. This type of seizure causes you to: ? Tighten the muscles of the whole body. ? Lose consciousness.  Having had a head  injury or strokes before.  Having had a lack of oxygen at birth. What are the signs or symptoms? There are many types of seizures. The symptoms vary depending on the type of seizure you have. Symptoms during a seizure  Shaking that you cannot control (convulsions) with fast, jerky movements of muscles.  Stiffness of the body.  Breathing problems.  Feeling mixed up (confused).  Staring or not responding to sound or touch.  Head nodding.  Eyes that blink, flutter, or move fast.  Drooling, grunting, or making clicking sounds with your mouth  Losing control of when you pee or poop. Symptoms before a seizure  Feeling afraid, nervous, or worried.  Feeling like you may vomit.  Feeling like: ? You are moving when you are not. ? Things around you  are moving when they are not.  Feeling like you saw or heard something before (dj vu).  Odd tastes or smells.  Changes in how you see. You may see flashing lights or spots. Symptoms after a seizure  Feeling confused.  Feeling sleepy.  Headache.  Sore muscles. How is this treated? If your seizure stops on its own, you will not need treatment. If your seizure lasts longer than 5 minutes, you will normally need treatment. Treatment may include:  Medicines given through an IV tube.  Avoiding things, such as medicines, that are known to cause your seizures.  Medicines to prevent seizures.  A device to prevent or control seizures.  Surgery.  A diet low in carbohydrates and high in fat (ketogenic diet). Follow these instructions at home: Medicines  Take over-the-counter and prescription medicines only as told by your doctor.  Avoid foods or drinks that may keep your medicine from working, such as alcohol. Activity  Follow instructions about driving, swimming, or doing things that would be dangerous if you had another seizure. Wait until your doctor says it is safe for you to do these things.  If you live in the [place] ask your local department of motor vehicles when you can drive.  Get a lot of rest. Teaching others  Teach friends and family what to do when you have a seizure. They should: ? Help you get down to the ground. ? Protect your head and body. ? Loosen any clothing around your neck. ? Turn you on your side. ? Know whether or not you need emergency care. ? Stay with you until you are better.  Also, tell them what not to do if you have a seizure. Tell them: ? They should not hold you down. ? They should not put anything in your mouth.   General instructions  Avoid anything that gives you seizures.  Keep a seizure diary. Write down: ? What you remember about each seizure. ? What you think caused each seizure.  Keep all follow-up visits. Contact a  doctor if:  You have another seizure or seizures. Call the doctor each time you have a seizure.  The pattern of your seizures changes.  You keep having seizures with treatment.  You have symptoms of being sick or having an infection.  You are not able to take your medicine. Get help right away if:  You have any of these problems: ? A seizure that lasts longer than 5 minutes. ? Many seizures in a row and you do not feel better between seizures. ? A seizure that makes it harder to breathe. ? A seizure and you can no longer speak or use part of your body.  You do not wake up right after a seizure.  You get hurt during a seizure.  You feel confused or have pain right after a seizure. These symptoms may be an emergency. Get help right away. Call your local emergency services (911 in the [place]  Do not wait to see if the symptoms will go away.  Do not drive yourself to the hospital. Summary  A seizure is a sudden burst of abnormal electrical and chemical activity in the brain. Seizures normally last from 30 seconds to 2 minutes.  Causes of seizures include illness, injury to the head, low levels of blood sugar or salt, and certain conditions.  Most seizures will stop on their own in less than 5 minutes. Seizures that last longer than 5 minutes are a medical emergency and need treatment right away.  Many medicines are used to treat seizures. Take over-the-counter and prescription medicines only as told by your doctor. This information is not intended to replace advice given to you by your health care provider. Make sure you discuss any questions you have with your health care provider. Document Revised: 11/22/2019 Document Reviewed: 11/22/2019 Elsevier Patient Education  2021 [employer].

## 2020-09-16 LAB — LAMOTRIGINE LEVEL: Lamotrigine Lvl: 4.9 ug/mL (ref 2.0–20.0)

## 2020-09-16 LAB — VALPROIC ACID LEVEL: Valproic Acid Lvl: 48 ug/mL — ABNORMAL LOW (ref 50–100)

## 2020-11-15 IMAGING — CT CT HEAD W/O CM
4 series · 16 of 47 positions shown, 18 images · non-contrast
Comparison: April 18, 2017

CLINICAL DATA: Altered mental status and fever

EXAM:
CT HEAD WITHOUT CONTRAST
TECHNIQUE: Contiguous axial images were obtained from the base of the skull
through the vertex without intravenous contrast.

[Series 2: head wo · axial · 0.37mm/px · z∈[-106,+4]mm · 7 of 30 slices shown, 9 images]
[im 4/30  brain]
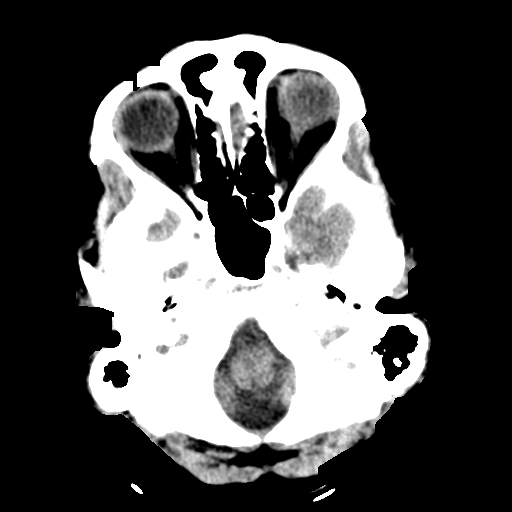
[im 4/30  bone]
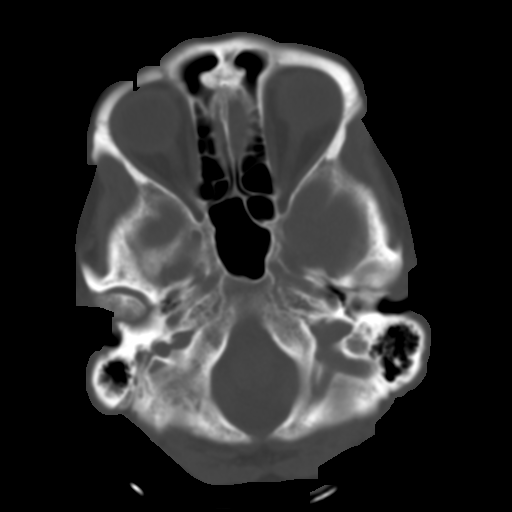
[im 8/30  brain]
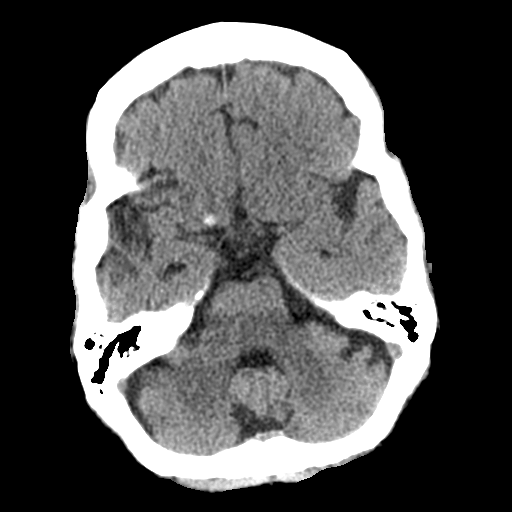
[im 11/30  brain]
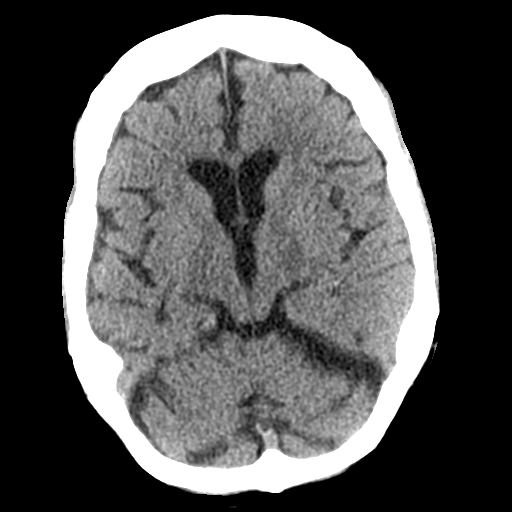
[im 15/30  brain]
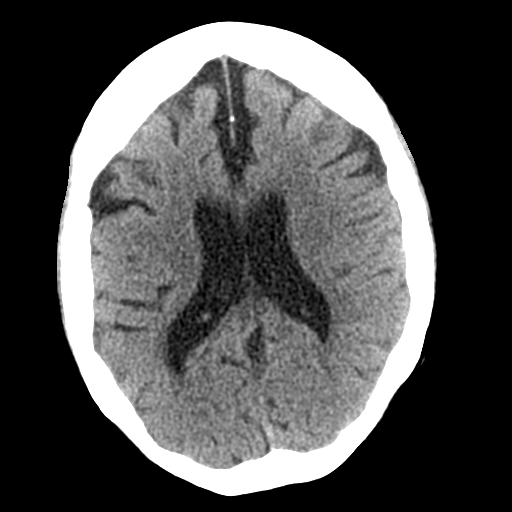
[im 19/30  brain]
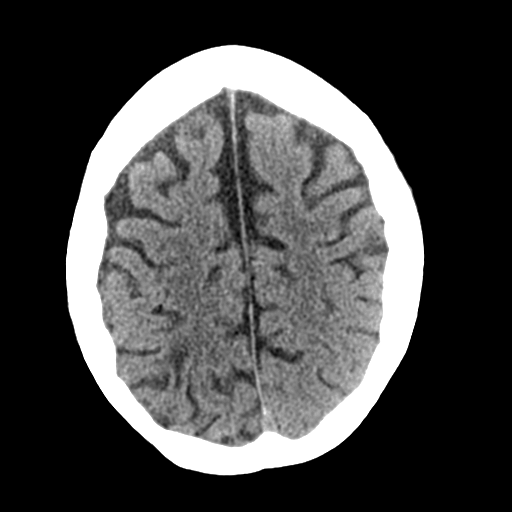
[im 19/30  bone]
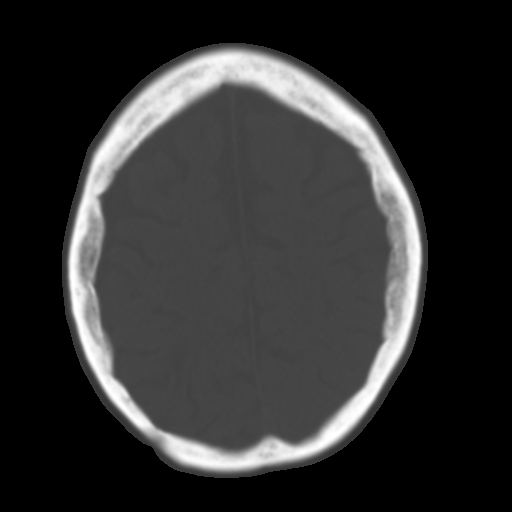
[im 22/30  brain]
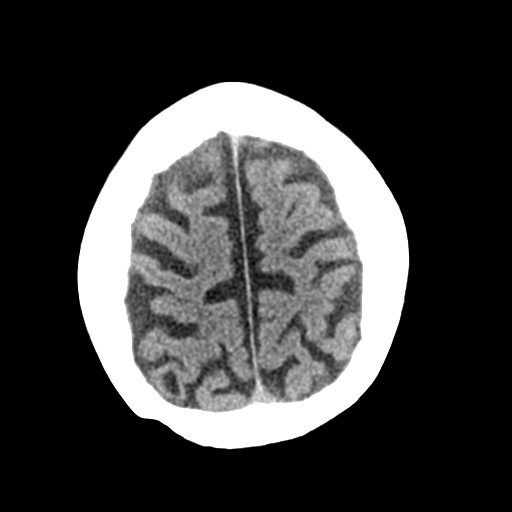
[im 26/30  brain]
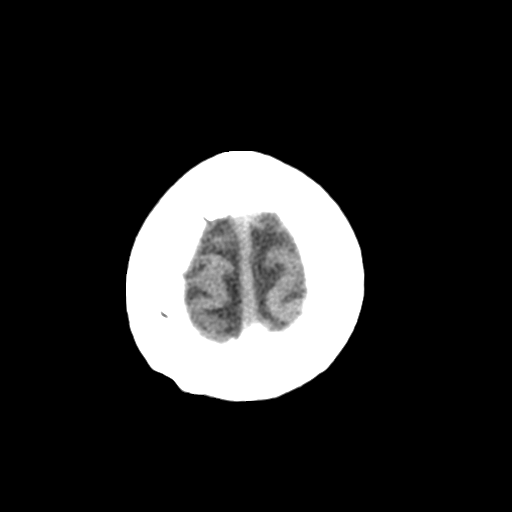

[Series 3: head bone · axial · 0.37mm/px · z∈[-107,-77]mm · 3 of 75 slices shown]
[im 8/75  bone]
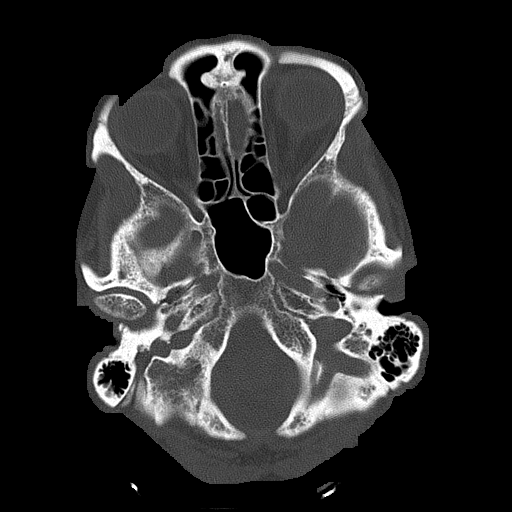
[im 15/75  bone]
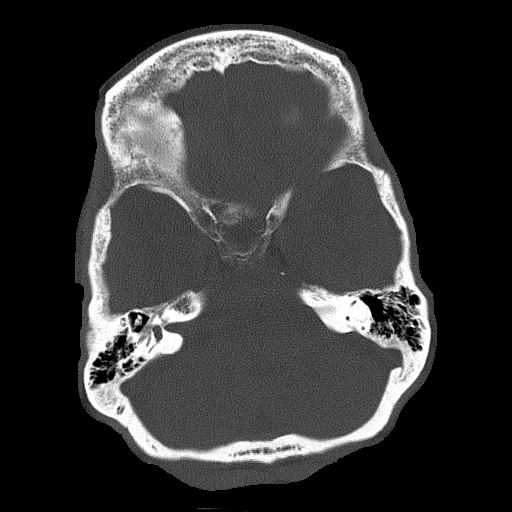
[im 23/75  bone]
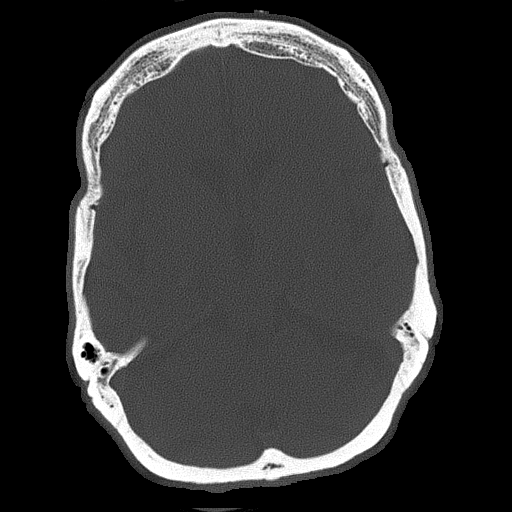

[Series 5: coronal soft tissue · coronal · 0.29mm/px · 3 of 61 slices shown]
[im 21/61  brain]
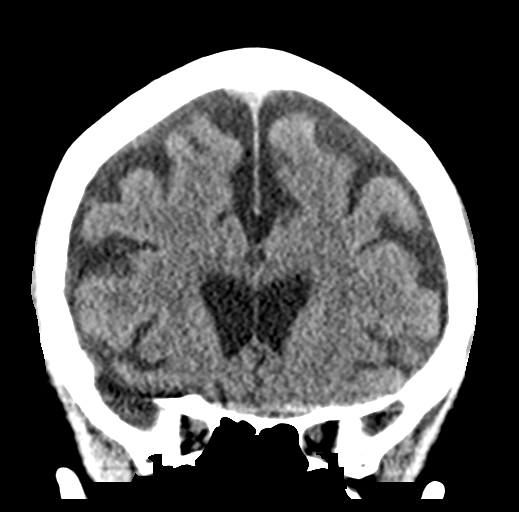
[im 27/61  brain]
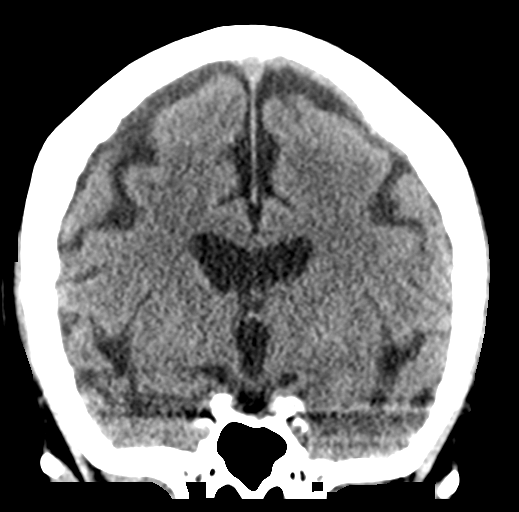
[im 34/61  brain]
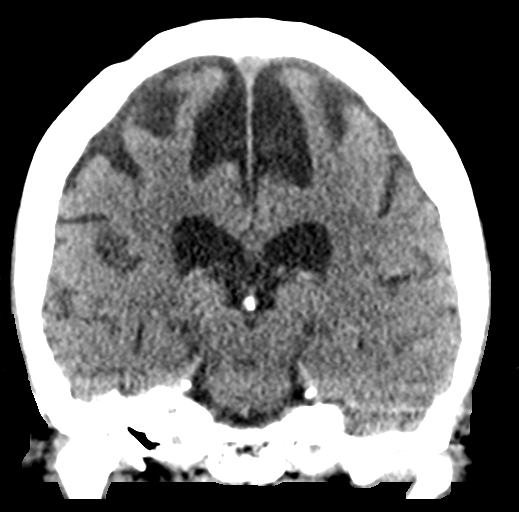

[Series 6: sagittal soft tissue · sagittal · 0.29mm/px · 3 of 50 slices shown]
[im 17/50  brain]
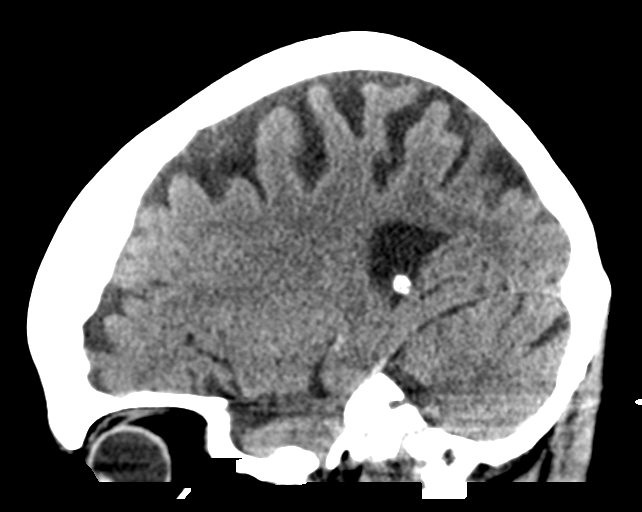
[im 25/50  brain]
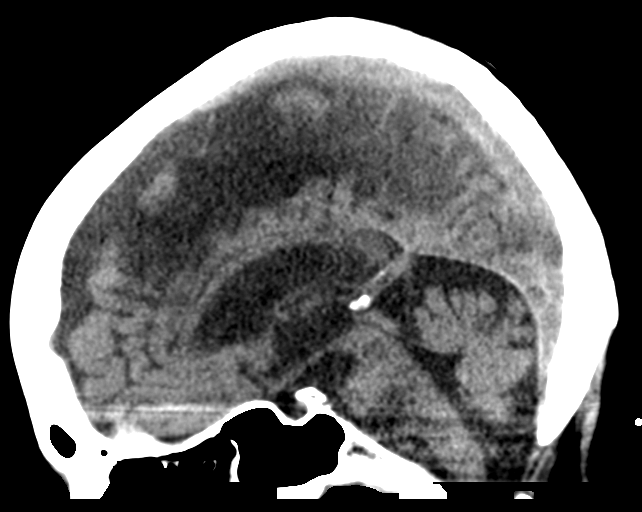
[im 33/50  brain]
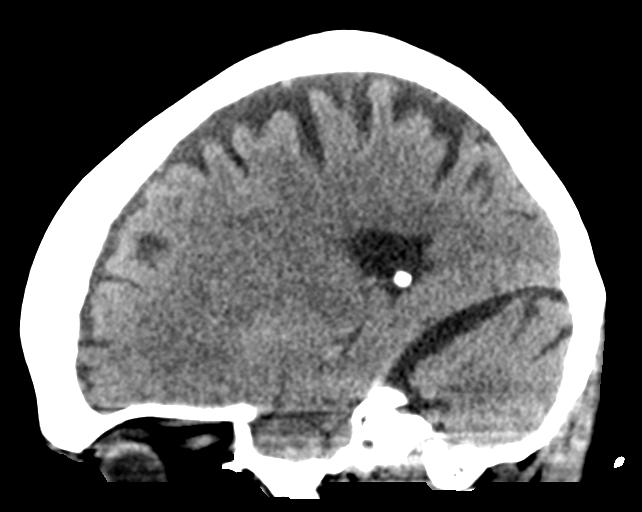

[16 of 47 positions shown; findings below may reference images not displayed]

FINDINGS: Brain: There is mild supratentorial atrophy with moderate cerebellar
atrophy, stable. There is no intracranial mass, hemorrhage,
extra-axial fluid collection, or midline shift. There is stable mild
posterior right parietal encephalomalacia. There is no demonstrable
acute infarct.

Vascular: No hyperdense vessel. There is no appreciable vascular
calcification.

Skull: Postoperative change in the right parietal bone is stable.
Bony calvarium otherwise appears intact and unchanged.

Sinuses/Orbits: There is mucosal thickening in several ethmoid air
cells. Other visualized paranasal sinuses are clear. Visualized
orbits appear symmetric bilaterally.

Other: Mastoid air cells on the left are clear. There is
opacification in several inferior mastoid air cells on the right.
IMPRESSION: Stable atrophy with atrophy greatest in the cerebellum.
Postoperative change right posterior parietal region with mild
encephalomalacia in this area. No acute infarct. Brain parenchyma
otherwise appears unremarkable. No mass or hemorrhage.

Mucosal thickening noted in several ethmoid air cells. Opacification
in several inferior mastoid air cells on the right.

## 2021-01-15 ENCOUNTER — Telehealth: Payer: Self-pay | Admitting: Family Medicine

## 2021-01-15 NOTE — Telephone Encounter (Signed)
Pt's son Casimiro Needle called wanting to discuss the more frequent dizzy spell 's that the pt has been experiencing. Casimiro Needle is requesting that the pt is to be called back on her cell phone number [phone]

## 2021-01-18 NOTE — Telephone Encounter (Signed)
Called and spoke w/ son. He states dizziness started last week. This is a new sx. Advised she should follow up with PCP first to r/o causes for sx. If they feel she should follow up w/ neuro after being evaluated, they can place referral for new sx and get scheduled with doctor (no NP) here for new evaluation for dizziness. He verbalized understanding and appreciation.

## 2021-03-10 ENCOUNTER — Encounter: Payer: Self-pay | Admitting: Family Medicine

## 2021-03-10 ENCOUNTER — Ambulatory Visit: Payer: Medicare HMO | Admitting: Family Medicine

## 2021-03-10 VITALS — BP 121/73 | HR 76 | Ht 63.0 in | Wt 110.8 lb

## 2021-03-10 DIAGNOSIS — R42 Dizziness and giddiness: Secondary | ICD-10-CM | POA: Diagnosis not present

## 2021-03-10 DIAGNOSIS — R569 Unspecified convulsions: Secondary | ICD-10-CM | POA: Diagnosis not present

## 2021-03-10 DIAGNOSIS — W19XXXA Unspecified fall, initial encounter: Secondary | ICD-10-CM

## 2021-03-10 DIAGNOSIS — F3160 Bipolar disorder, current episode mixed, unspecified: Secondary | ICD-10-CM | POA: Diagnosis not present

## 2021-03-10 DIAGNOSIS — R561 Post traumatic seizures: Secondary | ICD-10-CM | POA: Diagnosis not present

## 2021-03-10 NOTE — Patient Instructions (Signed)
Below is our plan:  We will continue divalproex ER 500mg  and lamotrigine ER 100mg  daily. Please make sure to take these medications at the same time every day. Please increase water intake. Follow up asap with PCP for concerns of dizziness.   Please make sure you are staying well hydrated. I recommend 50-60 ounces daily. Well balanced diet and regular exercise encouraged. Consistent sleep schedule with 6-8 hours recommended.   Please continue follow up with care team as directed.   Follow up with me in 4 months.   You may receive a survey regarding today's visit. I encourage you to leave honest feed back as I do use this information to improve patient care. Thank you for seeing me today!

## 2021-03-10 NOTE — Progress Notes (Signed)
PATIENT: Shannon Barry DOB: 20-May-1951  REASON FOR VISIT: follow up HISTORY FROM: patient  Chief Complaint  Patient presents with   Follow-up    New rm, daughter in law. Here for SZ f/u. Pt reports feeling dizzy for over a month. On 03/06/21 pt had a sz, caused her to fall hit her head and have a black eye. Pt did not go to ER. She continues to feel dizzy especially when turning in bed.       HISTORY OF PRESENT ILLNESS: 03/10/21 ALL: Shannon Barry returns for follow up. She reports breakthrough seizure 03/06/2021. She reports waking up around 1am to use the restroom. She was returning to the bed and reports that she was saying "ok, ok, ok". She reports falling in her living room and hit her head on a brick hearth. She remembers "beating" her head several times on the edge of the fireplace. She went back to bed and did not seek medical attention. Event not witnessed. She states that the next morning she told her son (who lives with her) that she had a seizure. Her daughter in law reports seeing her on 03/08/21 and noticed her left eye was black. She doesn't remember being dizzy or lightheaded. No incontinence or tongue injuries. She reports being more emotional the day of the seizure event as her son had gotten married. Daughter in law reports that she had called her several nights the week before stating that she was not able to sleep. Daughter in law reports she has seemed normal since. No unusual confusion or unusual behavior since. She denies headache. No asa or blood thinners. She reports normal appetite. She drinks about 2 glasses of water daily and several glasses of tea. She takes her levothyroxine every morning but does not take seizure medication at the same time every day. She manages her medication in pill organizer. She denies difficulty remembering to take medications. She does not drive.  She reports dizziness with turning head for the past month. She is having more allergy symptoms and  taking antihistamine.   09/15/2020 ALL: [name] returns for follow up for seizures. She continues lamotrigine and divalproex. She is tolerating medicaitons well. No obvious adverse effects. She is followed regularly by PCP. She plans to update Dexa this year. Memory is stable. She lives in a split level home with her youngest son. Her oldest son makes sure bills are paid but she is able to manage her money, go to the grocery store, etc. She performs ADL independently. She does not drive. Rarely mises doses of medication.   09/12/2019 ALL:  Shannon Barry is a 70 y.o. female here today for follow up for seizure. She continues lamotrigine ER 100mg  daily and divalproex ER 500mg  at bedtime. She is not sure but thinks she may have had a seizure. She reports defecating in her hallway at home. She does not remember the event. She just remembers waking up and seeing the a mess in the hall and her undergarments were soiled. Event was not witnessed. She does endorse being dehydrated from a bout of diarrhea. No tongue injuries, no head injuries. No tenderness or weakness of extremities. She reports tonic clonic movements with typical seizures. No other seizure activity. She usually does not miss doses of AED's but admits that she has been out of her divalproex for about 4 days.   She was admitted to the hospital on 03/14/2019. Son had called our office two days prior with concerns of stroke and advised to  take her to the ER. EMS was called to her home on 10/15 due to confusion, weakness, fever and cough. She was treated for CAP, Covid negative. She had significant deconditioning and was sent to rehab at [facility]. She was infected with COVID while in rehab. She never had any symptoms and recovered. She lives in a rental home with her son. She is very happy. She feels that memory is stable. She is able to dose her own medications and performs all ADL's independently. She does not drive.   HISTORY: (copied from my note  on 07/12/2018)  Shannon Barry is a 70 y.o. female here today for follow up. She reports feeling well. She is taking lamotrigine 100mg  ER and divalproex 500mg  ER every night. She denies seizure activity. She is able to perform ADL's. She has not driven in 20+ years. She is currently living with her son who helps her with medications. She is feeling well today and without complaints.    HISTORY: (copied from note on 10/31/2017)  Shannon Barry is a 70 year old female with a history of seizures.  She returns today for follow-up.  She is currently on Depakote taking 500 mg extended release as well as a Lamictal ER 100 mg daily.  She denies any seizure events.  She continues to live at [facility].  Reports that she is able to complete all ADLs independently.  She does not operate a motor vehicle.  Denies any changes in her gait or balance.  Denies any falls.  She returns today for evaluation.   REVIEW OF SYSTEMS: Out of a complete 14 system review of symptoms, the patient complains only of the following symptoms, seizures, dizziness, and all other reviewed systems are negative.  ALLERGIES: Allergies  Allergen Reactions   Penicillins Hives    Has patient had a PCN reaction causing immediate rash, facial/tongue/throat swelling, SOB or lightheadedness with hypotension: No Has patient had a PCN reaction causing severe rash involving mucus membranes or skin necrosis: Yes Has patient had a PCN reaction that required hospitalization No Has patient had a PCN reaction occurring within the last 10 years: Yes If all of the above answers are "NO", then may proceed with Cephalosporin use.    Sulfa Antibiotics Other (See Comments)    violently ill    HOME MEDICATIONS: Outpatient Medications Prior to Visit  Medication Sig Dispense Refill   divalproex (DEPAKOTE ER) 500 MG 24 hr tablet Take 1 tablet (500 mg total) by mouth daily. 90 tablet 3   LamoTRIgine 100 MG TB24 24 hour tablet TAKE 1  TABLET BY MOUTH EVERYDAY AT BEDTIME 90 tablet 3   desvenlafaxine (PRISTIQ) 50 MG 24 hr tablet Take 1 tablet (50 mg total) by mouth daily. 30 tablet 0   levothyroxine (SYNTHROID) 50 MCG tablet Take 1 tablet (50 mcg total) by mouth daily at 6 (six) AM. 30 tablet 0   No facility-administered medications prior to visit.    PAST MEDICAL HISTORY: Past Medical History:  Diagnosis Date   Allergy    Depression    Seizures (HCC)    Thyroid disease    Traumatic brain injury     PAST SURGICAL HISTORY: History reviewed. No pertinent surgical history.  FAMILY HISTORY: History reviewed. No pertinent family history.  SOCIAL HISTORY: Social History   Socioeconomic History   Marital status: Divorced    Spouse name: Not on file   Number of children: Not on file   Years of education: Not on file  Highest education level: Not on file  Occupational History   Not on file  Tobacco Use   Smoking status: Never   Smokeless tobacco: Never  Substance and Sexual Activity   Alcohol use: No    Alcohol/week: 0.0 standard drinks   Drug use: No   Sexual activity: Not on file  Other Topics Concern   Not on file  Social History Narrative   Not on file   Social Determinants of Health   Financial Resource Strain: Not on file  Food Insecurity: Not on file  Transportation Needs: Not on file  Physical Activity: Not on file  Stress: Not on file  Social Connections: Not on file  Intimate Partner Violence: Not on file      PHYSICAL EXAM  Vitals:   03/10/21 0823  BP: 121/73  Pulse: 76  Weight: 110 lb 12.8 oz (50.3 kg)  Height: 5\' 3"  (1.6 m)    Body mass index is 19.63 kg/m.  Generalized: Well developed, in no acute distress  Cardiology: normal rate and rhythm, no murmur noted Respiratory: clear to auscultation bilaterally  Neurological examination  Mentation: Alert oriented to time, place, history taking. Follows all commands speech and language fluent Cranial nerve II-XII: Pupils  were equal round reactive to light. Extraocular movements were full, visual field were full on confrontational test. Facial sensation and strength were normal. Uvula tongue midline. Head turning and shoulder shrug  were normal and symmetric. Motor: The motor testing reveals 5 over 5 strength of all 4 extremities. Good symmetric motor tone is noted throughout.  Sensory: Sensory testing is intact to soft touch on all 4 extremities. No evidence of extinction is noted.  Coordination: Cerebellar testing reveals good finger-nose-finger and heel-to-shin bilaterally.  Gait and station: Gait is mildly wide but stable, no assistive device Reflexes: Deep tendon reflexes are symmetric and normal bilaterally.   DIAGNOSTIC DATA (LABS, IMAGING, TESTING) - I reviewed patient records, labs, notes, testing and imaging myself where available.  MMSE - Mini Mental State Exam 10/31/2017 08/03/2016 04/05/2016  Orientation to time 5 4 4   Orientation to Place 5 5 3   Registration 3 3 3   Attention/ Calculation 4 3 4   Recall 3 1 3   Language- name 2 objects 2 2 2   Language- repeat 1 1 1   Language- follow 3 step command 3 3 3   Language- read & follow direction 1 1 1   Write a sentence 1 1 1   Write a sentence-comments - - -  Copy design 1 0 1  Total score 29 24 26      Lab Results  Component Value Date   WBC 7.9 03/16/2019   HGB 11.5 (L) 03/16/2019   HCT 32.3 (L) 03/16/2019   MCV 86.6 03/16/2019   PLT 212 03/16/2019      Component Value Date/Time   NA 137 03/16/2019 0808   NA 135 10/31/2017 1530   K 4.4 03/16/2019 0808   CL 107 03/16/2019 0808   CO2 20 (L) 03/16/2019 0808   GLUCOSE 91 03/16/2019 0808   BUN 9 03/16/2019 0808   BUN 6 (L) 10/31/2017 1530   CREATININE 0.38 (L) 03/16/2019 0808   CALCIUM 8.0 (L) 03/16/2019 0808   PROT 5.4 (L) 03/14/2019 0951   PROT 6.6 10/31/2017 1530   ALBUMIN 2.3 (L) 03/14/2019 0951   ALBUMIN 4.2 10/31/2017 1530   AST 21 03/14/2019 0951   ALT 19 03/14/2019 0951   ALKPHOS  47 03/14/2019 0951   BILITOT 0.5 03/14/2019 0951   BILITOT <  0.2 10/31/2017 1530   GFRNONAA >60 03/16/2019 0808   GFRAA >60 03/16/2019 0808   No results found for: CHOL, HDL, LDLCALC, LDLDIRECT, TRIG, CHOLHDL No results found for: JJHE1D Lab Results  Component Value Date   VITAMINB12 242 03/15/2019   Lab Results  Component Value Date   TSH 0.181 (L) 03/14/2019       ASSESSMENT AND PLAN 70 y.o. year old female  has a past medical history of Allergy, Depression, Seizures (HCC), Thyroid disease, and Traumatic brain injury. here with     ICD-10-CM   1. 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 Fall, initial encounter  W19.XXXA     4. Mixed bipolar I disorder (HCC)  F31.60     5. Dizziness  R42       Zyonna reports having a breakthrough seizure on 03/06/2021. Patient is poor historian with previous concerns of memory loss and traumatic brain injury. Event was not witnessed and she reports remembering hitting her head several times on a brick fireplace. There is significant bruising noted to left eye and frontal region of head, however, no lacerations or breaks in skin integrity. I will continue lamotrigine ER 100mg  daily and divalproex ER 500mg  daily. I will update labs, today to evaluate for any possible metabolic concerns and to ensure appropriate AED levels. CT scan ordered to assess for internal bleeding. She will follow up ASAP with PCP for concerns of dizziness with turning head. She is having more allergy symptoms that could be contributing as well. Memory seems stable. I have asked her family to ensure she is taking medications consistently at same time every day. She was encouraged to stay well hydrated and work on healthy lifestyle habits. She will follow up in 4 months.    Orders Placed This Encounter  Procedures   CT HEAD WO  CONTRAST ( )    Standing Status:   Future    Standing Expiration Date:   03/10/2022    Order Specific Question:   Preferred imaging location?    Answer:   [id number]   CBC with Differential/Platelets   CMP   Valproic Acid Level   Lamotrigine level      No orders of the defined types were placed in this encounter.     05/10/2022, FNP-C 03/10/2021, 10:27 AM [facility] [address] [place], [address] [place] [phone]

## 2021-03-11 ENCOUNTER — Telehealth: Payer: Self-pay | Admitting: *Deleted

## 2021-03-11 NOTE — Telephone Encounter (Signed)
-----   Message from Shawnie Dapper, NP sent at 03/11/2021  2:42 PM EDT ----- Can you please call her son and let him know that most of her labs are back. Still waiting on lamotrigine level.  Her Depakote level looks ok but her sodium level was pretty low. It was 126 and normal is 135-145. It has been low in the past but its been a couple of years ago. I need her to see PCP ASAP. This can be really concerning if it drops any lower. I am concerned that her Pristiq could be causing the sodium to be too low. Is she drinking alcohol at all? Does she drink a lot of water? Can we please call PCP as well to make them aware and send them a copy of labs. I would like for her to be seen in the next 24-48 hours if only for another sodium lab to ensure level is accurate. Low sodium can definitely cause seizure. Please update me after talking with family and PCP.

## 2021-03-11 NOTE — Telephone Encounter (Signed)
Called who I thought was son, Shannon Barry. Got pt. Relayed results to pt. She requested I call son, Shannon Barry. I called and relayed results. He will help get her set up w/ appt w/ PCP in next 24-48hr. He also asked about scheduling CT scan at [facility] imaging. Advised it is approved via insurance, appears she can do walk in per appt notes. He will call them at [phone] to see best time to do walk in. He confirmed PCP is "[facility]". Aware I will be calling to get lab report sent over to them as well.   Called PCP at [phone]. Spoke w/ Justin Mend. Fax [phone]. Now called [facility] (bought out [facility]). Confirmed Shannon Cooter Tripp,MD part of their system. Pt inactivated by mistake when system updated, they will activate her again and will schedule once son, Shannon Barry reaches out. She placed note in pt chart. I faxed lab results, received confirmation. Marked urgent.

## 2021-03-12 ENCOUNTER — Ambulatory Visit
Admission: RE | Admit: 2021-03-12 | Discharge: 2021-03-12 | Disposition: A | Discharge status: Home / Self Care | Payer: Self-pay | Source: Ambulatory Visit | Attending: Family Medicine | Admitting: Family Medicine

## 2021-03-12 DIAGNOSIS — R569 Unspecified convulsions: Secondary | ICD-10-CM | POA: Diagnosis not present

## 2021-03-15 ENCOUNTER — Telehealth: Payer: Self-pay | Admitting: *Deleted

## 2021-03-15 NOTE — Telephone Encounter (Signed)
-----   Message from Shawnie Dapper, NP sent at 03/15/2021 12:30 PM EDT ----- Can you guys please let her know that the CT looked ok. No signs of bleeding. Can you please make sure her sodium levels were rechecked by PCP or is being lined up? TY!

## 2021-03-15 NOTE — Telephone Encounter (Signed)
Called and spoke with [name] (son). Relayed CT head results (states he already saw on mychart). He verbalized understanding.  He states PCP should be coming out tomorrow to recheck her sodium level (unsure of time). He states he had things for his own kids he had to take care of and unable to coordinate her getting labs last week. Reminded him of risk if sodium level goes too low (increased risk of seizures). He verbalized understanding. He had no further questions.

## 2021-03-16 LAB — CBC WITH DIFFERENTIAL/PLATELET
Basophils Absolute: 0 10*3/uL (ref 0.0–0.2)
Basos: 1 %
EOS (ABSOLUTE): 0 10*3/uL (ref 0.0–0.4)
Eos: 1 %
Hematocrit: 39.7 % (ref 34.0–46.6)
Hemoglobin: 13.1 g/dL (ref 11.1–15.9)
Immature Grans (Abs): 0 10*3/uL (ref 0.0–0.1)
Immature Granulocytes: 0 %
Lymphocytes Absolute: 1.7 10*3/uL (ref 0.7–3.1)
Lymphs: 45 %
MCH: 31.3 pg (ref 26.6–33.0)
MCHC: 33 g/dL (ref 31.5–35.7)
MCV: 95 fL (ref 79–97)
Monocytes Absolute: 0.4 10*3/uL (ref 0.1–0.9)
Monocytes: 12 %
Neutrophils Absolute: 1.5 10*3/uL (ref 1.4–7.0)
Neutrophils: 41 %
Platelets: 217 10*3/uL (ref 150–450)
RBC: 4.19 x10E6/uL (ref 3.77–5.28)
RDW: 11.7 % (ref 11.7–15.4)
WBC: 3.6 10*3/uL (ref 3.4–10.8)

## 2021-03-16 LAB — COMPREHENSIVE METABOLIC PANEL
ALT: 12 IU/L (ref 0–32)
AST: 22 IU/L (ref 0–40)
Albumin/Globulin Ratio: 2 (ref 1.2–2.2)
Albumin: 4.3 g/dL (ref 3.8–4.8)
Alkaline Phosphatase: 60 IU/L (ref 44–121)
BUN/Creatinine Ratio: 7 — ABNORMAL LOW (ref 12–28)
BUN: 5 mg/dL — ABNORMAL LOW (ref 8–27)
Bilirubin Total: 0.3 mg/dL (ref 0.0–1.2)
CO2: 27 mmol/L (ref 20–29)
Calcium: 9.5 mg/dL (ref 8.7–10.3)
Chloride: 92 mmol/L — ABNORMAL LOW (ref 96–106)
Creatinine, Ser: 0.67 mg/dL (ref 0.57–1.00)
Globulin, Total: 2.1 g/dL (ref 1.5–4.5)
Glucose: 92 mg/dL (ref 70–99)
Potassium: 4.6 mmol/L (ref 3.5–5.2)
Sodium: 126 mmol/L — ABNORMAL LOW (ref 134–144)
Total Protein: 6.4 g/dL (ref 6.0–8.5)
eGFR: 95 mL/min/{1.73_m2} (ref >59)

## 2021-03-16 LAB — LAMOTRIGINE LEVEL: Lamotrigine Lvl: 7.3 ug/mL (ref 2.0–20.0)

## 2021-03-16 LAB — VALPROIC ACID LEVEL: Valproic Acid Lvl: 49 ug/mL — ABNORMAL LOW (ref 50–100)

## 2021-05-06 ENCOUNTER — Ambulatory Visit (INDEPENDENT_AMBULATORY_CARE_PROVIDER_SITE_OTHER): Payer: Medicare HMO | Admitting: Nurse Practitioner

## 2021-05-06 ENCOUNTER — Encounter: Payer: Self-pay | Admitting: Nurse Practitioner

## 2021-05-06 ENCOUNTER — Other Ambulatory Visit: Payer: Self-pay

## 2021-05-06 VITALS — BP 110/62 | HR 98 | Temp 98.1°F | Ht 63.0 in | Wt 106.0 lb

## 2021-05-06 DIAGNOSIS — R42 Dizziness and giddiness: Secondary | ICD-10-CM

## 2021-05-06 DIAGNOSIS — Z7689 Persons encountering health services in other specified circumstances: Secondary | ICD-10-CM

## 2021-05-06 DIAGNOSIS — R0982 Postnasal drip: Secondary | ICD-10-CM | POA: Diagnosis not present

## 2021-05-06 DIAGNOSIS — E2839 Other primary ovarian failure: Secondary | ICD-10-CM

## 2021-05-06 DIAGNOSIS — E871 Hypo-osmolality and hyponatremia: Secondary | ICD-10-CM | POA: Diagnosis not present

## 2021-05-06 DIAGNOSIS — Z1211 Encounter for screening for malignant neoplasm of colon: Secondary | ICD-10-CM

## 2021-05-06 DIAGNOSIS — Z1159 Encounter for screening for other viral diseases: Secondary | ICD-10-CM

## 2021-05-06 DIAGNOSIS — E039 Hypothyroidism, unspecified: Secondary | ICD-10-CM | POA: Diagnosis not present

## 2021-05-06 DIAGNOSIS — Z1231 Encounter for screening mammogram for malignant neoplasm of breast: Secondary | ICD-10-CM

## 2021-05-06 MED ORDER — CETIRIZINE HCL 10 MG PO TABS: 10.0000 mg | ORAL_TABLET | Freq: Every day | ORAL | 1 refills | Status: DC

## 2021-05-06 NOTE — Patient Instructions (Signed)
Seizure, Adult °A seizure is a sudden burst of abnormal electrical and chemical activity in the brain. Seizures usually last from 30 seconds to 2 minutes.  °What are the causes? °Common causes of this condition include: °Fever or infection. °Problems that affect the brain. These may include: °A brain or head injury. °Bleeding in the brain. °A brain tumor. °Low levels of blood sugar or salt. °Kidney problems or liver problems. °Conditions that are passed from parent to child (are inherited). °Problems with a substance, such as: °Having a reaction to a drug or a medicine. °Stopping the use of a substance all of a sudden (withdrawal). °A stroke. °Disorders that affect how you develop. °Sometimes, the cause may not be known.  °What increases the risk? °Having someone in your family who has epilepsy. In this condition, seizures happen again and again over time. They have no clear cause. °Having had a tonic-clonic seizure before. This type of seizure causes you to: °Tighten the muscles of the whole body. °Lose consciousness. °Having had a head injury or strokes before. °Having had a lack of oxygen at birth. °What are the signs or symptoms? °There are many types of seizures. The symptoms vary depending on the type of seizure you have. °Symptoms during a seizure °Shaking that you cannot control (convulsions) with fast, jerky movements of muscles. °Stiffness of the body. °Breathing problems. °Feeling mixed up (confused). °Staring or not responding to sound or touch. °Head nodding. °Eyes that blink, flutter, or move fast. °Drooling, grunting, or making clicking sounds with your mouth °Losing control of when you pee or poop. °Symptoms before a seizure °Feeling afraid, nervous, or worried. °Feeling like you may vomit. °Feeling like: °You are moving when you are not. °Things around you are moving when they are not. °Feeling like you saw or heard something before (déjà vu). °Odd tastes or smells. °Changes in how you see. You may  see flashing lights or spots. °Symptoms after a seizure °Feeling confused. °Feeling sleepy. °Headache. °Sore muscles. °How is this treated? °If your seizure stops on its own, you will not need treatment. If your seizure lasts longer than 5 minutes, you will normally need treatment. Treatment may include: °Medicines given through an IV tube. °Avoiding things, such as medicines, that are known to cause your seizures. °Medicines to prevent seizures. °A device to prevent or control seizures. °Surgery. °A diet low in carbohydrates and high in fat (ketogenic diet). °Follow these instructions at home: °Medicines °Take over-the-counter and prescription medicines only as told by your doctor. °Avoid foods or drinks that may keep your medicine from working, such as alcohol. °Activity °Follow instructions about driving, swimming, or doing things that would be dangerous if you had another seizure. Wait until your doctor says it is safe for you to do these things. °If you live in the [place] ask your local department of motor vehicles when you can drive. °Get a lot of rest. °Teaching others ° °Teach friends and family what to do when you have a seizure. They should: °Help you get down to the ground. °Protect your head and body. °Loosen any clothing around your neck. °Turn you on your side. °Know whether or not you need emergency care. °Stay with you until you are better. °Also, tell them what not to do if you have a seizure. Tell them: °They should not hold you down. °They should not put anything in your mouth. °General instructions °Avoid anything that gives you seizures. °Keep a seizure diary. Write down: °What you remember   about each seizure. °What you think caused each seizure. °Keep all follow-up visits. °Contact a doctor if: °You have another seizure or seizures. Call the doctor each time you have a seizure. °The pattern of your seizures changes. °You keep having seizures with treatment. °You have symptoms of being sick or  having an infection. °You are not able to take your medicine. °Get help right away if: °You have any of these problems: °A seizure that lasts longer than 5 minutes. °Many seizures in a row and you do not feel better between seizures. °A seizure that makes it harder to breathe. °A seizure and you can no longer speak or use part of your body. °You do not wake up right after a seizure. °You get hurt during a seizure. °You feel confused or have pain right after a seizure. °These symptoms may be an emergency. Get help right away. Call your local emergency services (911 in the [place] °Do not wait to see if the symptoms will go away. °Do not drive yourself to the hospital. °Summary °A seizure is a sudden burst of abnormal electrical and chemical activity in the brain. Seizures normally last from 30 seconds to 2 minutes. °Causes of seizures include illness, injury to the head, low levels of blood sugar or salt, and certain conditions. °Most seizures will stop on their own in less than 5 minutes. Seizures that last longer than 5 minutes are a medical emergency and need treatment right away. °Many medicines are used to treat seizures. Take over-the-counter and prescription medicines only as told by your doctor. °This information is not intended to replace advice given to you by your health care provider. Make sure you discuss any questions you have with your health care provider. °Document Revised: 11/22/2019 Document Reviewed: 11/22/2019 °Elsevier Patient Education © 2022 [employer] ° °
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
## 2021-05-07 LAB — BMP8+EGFR
BUN/Creatinine Ratio: 12 (ref 12–28)
BUN: 9 mg/dL (ref 8–27)
CO2: 23 mmol/L (ref 20–29)
Calcium: 9.5 mg/dL (ref 8.7–10.3)
Chloride: 96 mmol/L (ref 96–106)
Creatinine, Ser: 0.74 mg/dL (ref 0.57–1.00)
Glucose: 86 mg/dL (ref 70–99)
Potassium: 5 mmol/L (ref 3.5–5.2)
Sodium: 135 mmol/L (ref 134–144)
eGFR: 88 mL/min/{1.73_m2} (ref >59)

## 2021-05-07 LAB — CBC
Hematocrit: 42.9 % (ref 34.0–46.6)
Hemoglobin: 14.1 g/dL (ref 11.1–15.9)
MCH: 31 pg (ref 26.6–33.0)
MCHC: 32.9 g/dL (ref 31.5–35.7)
MCV: 94 fL (ref 79–97)
Platelets: 217 10*3/uL (ref 150–450)
RBC: 4.55 x10E6/uL (ref 3.77–5.28)
RDW: 12 % (ref 11.7–15.4)
WBC: 3.5 10*3/uL (ref 3.4–10.8)

## 2021-05-07 LAB — T3, FREE: T3, Free: 1.8 pg/mL — ABNORMAL LOW (ref 2.0–4.4)

## 2021-05-07 LAB — TSH: TSH: 3.04 u[IU]/mL (ref 0.450–4.500)

## 2021-05-07 LAB — T4: T4, Total: 8.3 ug/dL (ref 4.5–12.0)

## 2021-05-07 LAB — HEPATITIS C ANTIBODY: Hep C Virus Ab: 0.2 s/co ratio (ref 0.0–0.9)

## 2021-05-26 ENCOUNTER — Telehealth: Payer: Self-pay | Admitting: Neurology

## 2021-05-26 ENCOUNTER — Encounter: Payer: Self-pay | Admitting: Neurology

## 2021-05-26 NOTE — Telephone Encounter (Signed)
Letter has been completed for the patient. Shannon Barry is out of the office until next week but it can be signed upon our office opening on Tuesday 06/01/2021. Called the son and made him aware and he will plan to pick up Tuesday.

## 2021-05-26 NOTE — Telephone Encounter (Signed)
Pt son came in wanting to get a letter to excuse Shannon Barry for jury duty in 2 weeks.

## 2021-06-15 ENCOUNTER — Other Ambulatory Visit: Payer: Medicare HMO

## 2021-07-20 ENCOUNTER — Encounter: Payer: Self-pay | Admitting: Family Medicine

## 2021-07-20 ENCOUNTER — Ambulatory Visit: Payer: Medicare HMO | Admitting: Family Medicine

## 2021-07-20 VITALS — BP 120/72 | HR 85 | Ht 63.0 in | Wt 109.5 lb

## 2021-07-20 DIAGNOSIS — R561 Post traumatic seizures: Secondary | ICD-10-CM | POA: Diagnosis not present

## 2021-07-20 DIAGNOSIS — R569 Unspecified convulsions: Secondary | ICD-10-CM | POA: Diagnosis not present

## 2021-07-20 MED ORDER — LAMOTRIGINE ER 100 MG PO TB24: ORAL_TABLET | ORAL | 3 refills | Status: DC

## 2021-07-20 MED ORDER — DIVALPROEX SODIUM ER 500 MG PO TB24: 500.0000 mg | ORAL_TABLET | Freq: Every day | ORAL | 3 refills | Status: DC

## 2021-07-20 NOTE — Patient Instructions (Signed)
Below is our plan:  We will continue divalproex ER 500mg  daily and lamotrigine XR 100mg  daily.   Please make sure you are staying well hydrated. I recommend 50-60 ounces daily. Well balanced diet and regular exercise encouraged. Consistent sleep schedule with 6-8 hours recommended.   Please continue follow up with care team as directed.   Follow up with me in  year   You may receive a survey regarding today's visit. I encourage you to leave honest feed back as I do use this information to improve patient care. Thank you for seeing me today!
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
## 2021-07-28 ENCOUNTER — Other Ambulatory Visit: Payer: Self-pay

## 2021-07-28 ENCOUNTER — Encounter: Payer: Medicare HMO | Admitting: Nurse Practitioner

## 2021-07-28 DIAGNOSIS — S069X3D Unspecified intracranial injury with loss of consciousness of 1 hour to 5 hours 59 minutes, subsequent encounter: Secondary | ICD-10-CM

## 2021-07-30 ENCOUNTER — Telehealth: Payer: Self-pay | Admitting: *Deleted

## 2021-07-30 NOTE — Chronic Care Management (AMB) (Signed)
?  Chronic Care Management  ? ?Note ? ?07/30/2021 ?Name: [name] MRN: [id number] DOB: 16-Dec-1950 ? ?Shannon Barry is a 71 y.o. year old female who is a primary care patient of [name], [place]. I reached out to Shannon Barry by phone today in response to a referral sent by Ms. Joanell Rising PCP. ? ?Shannon Barry was given information about Chronic Care Management services today including:  ?CCM service includes personalized support from designated clinical staff supervised by her physician, including individualized plan of care and coordination with other care providers ?24/7 contact phone numbers for assistance for urgent and routine care needs. ?Service will only be billed when office clinical staff spend 20 minutes or more in a month to coordinate care. ?Only one practitioner may furnish and bill the service in a calendar month. ?The patient may stop CCM services at any time (effective at the end of the month) by phone call to the office staff. ?The patient is responsible for co-pay (up to 20% after annual deductible is met) if co-pay is required by the individual health plan.  ? ?Shannon Barry DPR on file  verbally agreed to assistance and services provided by embedded care coordination/care management team today. ? ?Follow up plan: ?Telephone appointment with care management team member scheduled for:08/04/21 ? ?Laverda Sorenson  ?Care Guide, Embedded Care Coordination ?[place]  Care Management  ?Direct Dial: [phone] ? ?

## 2021-08-04 ENCOUNTER — Ambulatory Visit: Payer: Medicare HMO

## 2021-08-04 ENCOUNTER — Telehealth: Payer: Medicare HMO

## 2021-08-04 ENCOUNTER — Ambulatory Visit (INDEPENDENT_AMBULATORY_CARE_PROVIDER_SITE_OTHER): Payer: Medicare HMO

## 2021-08-04 DIAGNOSIS — E039 Hypothyroidism, unspecified: Secondary | ICD-10-CM

## 2021-08-04 DIAGNOSIS — S069X3D Unspecified intracranial injury with loss of consciousness of 1 hour to 5 hours 59 minutes, subsequent encounter: Secondary | ICD-10-CM

## 2021-08-04 DIAGNOSIS — R569 Unspecified convulsions: Secondary | ICD-10-CM

## 2021-08-04 NOTE — Chronic Care Management (AMB) (Signed)
?Chronic Care Management  ? ?CCM RN Visit Note ? ?08/04/2021 ?Name: Shannon Barry MRN: [id number] DOB: 1951-05-15 ? ?Subjective: ?Shannon Barry is a 71 y.o. year old female who is a primary care patient of [name], FNP. The care management team was consulted for assistance with disease management and care coordination needs.   ? 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  ? ?Consent to Services:  ?The patient was given information about Chronic Care Management services, agreed to services, and gave verbal consent prior to initiation of services.  Please see initial visit note for detailed documentation.  ? ?Patient agreed to services and verbal consent obtained.  ? ?Assessment: Review of patient past medical history, allergies, medications, health status, including review of consultants reports, laboratory and other test data, was performed as part of comprehensive evaluation and provision of chronic care management services.  ? ?SDOH (Social Determinants of Health) assessments and interventions performed:   ? ?CCM Care Plan ? ?Allergies  ?Allergen Reactions  ? Penicillins Hives  ?  Has patient had a PCN reaction causing immediate rash, facial/tongue/throat swelling, SOB or lightheadedness with hypotension: No ?Has patient had a PCN reaction causing severe rash involving mucus membranes or skin necrosis: Yes ?Has patient had a PCN reaction that required hospitalization No ?Has patient had a PCN reaction occurring within the last 10 years: Yes ?If all of the above answers are "NO", then may proceed with Cephalosporin use. ?  ? Sulfa Antibiotics Other (See Comments)  ?  violently ill  ? ? ?Outpatient Encounter Medications as  of 08/04/2021  ?Medication Sig  ? cetirizine (ZYRTEC ALLERGY) 10 MG tablet Take 1 tablet (10 mg total) by mouth daily.  ? desvenlafaxine (PRISTIQ) 50 MG 24 hr tablet Take 1 tablet (50 mg total) by mouth daily.  ? divalproex (DEPAKOTE ER) 500 MG 24 hr tablet Take 1 tablet (500 mg total) by mouth daily.  ? lamoTRIgine (LAMICTAL XR) 100 MG 24 hour tablet TAKE 1 TABLET BY MOUTH EVERYDAY AT BEDTIME  ? levothyroxine (SYNTHROID) 50 MCG tablet Take 1 tablet (50 mcg total) by mouth daily at 6 (six) AM.  ? ?No facility-administered encounter medications on file as of 08/04/2021.  ? ? ?Patient Active Problem List  ? Diagnosis Date Noted  ? Acute metabolic encephalopathy 03/14/2019  ? 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 History of alcohol abuse 11/10/2015  ? Alzheimer's disease (HCC) 11/10/2015  ? 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 Alcohol abuse 09/24/2015  ? OCD (obsessive compulsive disorder) 09/24/2015  ? Hyponatremia 07/11/2015  ? Chest pain 07/11/2015  ? Nausea and vomiting 07/11/2015  ? Seizures (HCC) 07/11/2015  ? Vertigo   ? 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 TBI (traumatic brain injury) 05/04/2015  ? 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 Dementia with behavioral disturbance (HCC) 03/18/2015  ? 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 ? 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 ?Care Plan : RN Care Manager Plan of Care  ?Updates made by Riley Churches, RN since 08/04/2021 12:00 AM  ?  ? ?  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  ? 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  ? ?Interventions: ?1:1 collaboration with primary care provider regarding development and update of comprehensive plan of care as evidenced by provider attestation and co-signature ?Inter-disciplinary care team collaboration (see longitudinal plan of care) ?Evaluation of current treatment plan related to  self management and patient's adherence to plan as established by provider ? ? 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 ?Patient Goals/Self-Care Activities: ?Take all medications as prescribed ?Attend all scheduled provider appointments ?Call pharmacy for medication refills 3-7 days in advance of running out of medications ?Call provider office for new concerns or questions  ? ?Follow Up Plan: Initial RN Care Manager telephone outreach is scheduled for: 08/17/21 ?  ? ? ?Delsa Sale, RN, BSN, CCM ?Care Management Coordinator ?[facility] Management/[facility]  ?Direct Phone: [phone] ? ? ? ? ? ? ? ? ? ?

## 2021-08-04 NOTE — Chronic Care Management (AMB) (Signed)
?Chronic Care Management  ? ? Social Work Note ? ?08/04/2021 ?Name: Shannon Barry MRN: [id number] DOB: 06-18-1950 ? ?Shannon Barry is a 71 y.o. year old female who is a primary care patient of [name], [place]. The CCM team was consulted to assist the patient with chronic disease management and/or care coordination needs related to:  Traumatic Brain Injury with loss of consciousness of 1 to 5 hours 59 minutes, Acquired Hypothyroidism, Seizures .  ? ?Engaged with patients son and caregiver [name] by phone  for initial visit in response to provider referral for social work chronic care management and care coordination services.  ? ?Consent to Services:  ?The patient was given the following information about Chronic Care Management services today, agreed to services, and gave verbal consent: 1. CCM service includes personalized support from designated clinical staff supervised by the primary care provider, including individualized plan of care and coordination with other care providers 2. 24/7 contact phone numbers for assistance for urgent and routine care needs. 3. Service will only be billed when office clinical staff spend 20 minutes or more in a month to coordinate care. 4. Only one practitioner may furnish and bill the service in a calendar month. 5.The patient may stop CCM services at any time (effective at the end of the month) by phone call to the office staff. 6. The patient will be responsible for cost sharing (co-pay) of up to 20% of the service fee (after annual deductible is met). Patient agreed to services and consent obtained. ? ?Patient agreed to services and consent obtained.  ? ?Assessment: Review of patient past medical history, allergies, medications, and health status, including review of relevant consultants reports was performed today as part of a comprehensive evaluation and provision of chronic care management and care coordination services.    ? ?SDOH (Social Determinants of Health)  assessments and interventions performed:  ?SDOH Interventions   ? ?Flowsheet Row Most Recent Value  ?SDOH Interventions   ?Food Insecurity Interventions Intervention Not Indicated  ?Housing Interventions Intervention Not Indicated  ?Transportation Interventions Other (Comment)  [Education on community resources mailed to patients son for review]  ? ?  ?  ? ?Advanced Directives Status: Not addressed in this encounter. ? ?CCM Care Plan ? ?Allergies  ?Allergen Reactions  ? Penicillins Hives  ?  Has patient had a PCN reaction causing immediate rash, facial/tongue/throat swelling, SOB or lightheadedness with hypotension: No ?Has patient had a PCN reaction causing severe rash involving mucus membranes or skin necrosis: Yes ?Has patient had a PCN reaction that required hospitalization No ?Has patient had a PCN reaction occurring within the last 10 years: Yes ?If all of the above answers are "NO", then may proceed with Cephalosporin use. ?  ? Sulfa Antibiotics Other (See Comments)  ?  violently ill  ? ? ?Outpatient Encounter Medications as of 08/04/2021  ?Medication Sig  ? cetirizine (ZYRTEC ALLERGY) 10 MG tablet Take 1 tablet (10 mg total) by mouth daily.  ? desvenlafaxine (PRISTIQ) 50 MG 24 hr tablet Take 1 tablet (50 mg total) by mouth daily.  ? divalproex (DEPAKOTE ER) 500 MG 24 hr tablet Take 1 tablet (500 mg total) by mouth daily.  ? lamoTRIgine (LAMICTAL XR) 100 MG 24 hour tablet TAKE 1 TABLET BY MOUTH EVERYDAY AT BEDTIME  ? levothyroxine (SYNTHROID) 50 MCG tablet Take 1 tablet (50 mcg total) by mouth daily at 6 (six) AM.  ? ?No facility-administered encounter medications on file as of 08/04/2021.  ? ? ?Patient Active Problem  List  ? Diagnosis Date Noted  ? Acute metabolic encephalopathy 52/84/1324  ? 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 History of alcohol abuse 11/10/2015  ? Alzheimer's disease ([place]) 11/10/2015  ? Mixed bipolar I disorder ([place]) 09/24/2015  ? Alcohol abuse 09/24/2015  ? OCD (obsessive compulsive  disorder) 09/24/2015  ? Hyponatremia 07/11/2015  ? Chest pain 07/11/2015  ? Nausea and vomiting 07/11/2015  ? Seizures ([place]) 07/11/2015  ? Vertigo   ? Post-traumatic seizures ([place]) 05/04/2015  ? TBI (traumatic brain injury) 05/04/2015  ? 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 Dementia with behavioral disturbance ([place]) 03/18/2015  ? 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 ? ?Conditions to be addressed/monitored:  Traumatic Brain Injury with loss of consciousness of 1 to 5 hours 59 minutes, Acquired Hypothyroidism, Seizures ; Transportation ? ?Care Plan : Social Work Plan of Care  ?Updates made by Daneen Schick since 08/04/2021 12:00 AM  ?  ? ?Problem: Barriers to Treatment   ?  ? ?Goal: Barriers to Treatment Identified and Managed   ?Start Date: 08/04/2021  ?Priority: High  ?Note:   ?Current Barriers:  ?Chronic disease management support and education needs related to  Traumatic Brain Injury with loss of consciousness of 1 to 5 hours 59 minutes, Acquired Hypothyroidism, Seizures   ?Transportation ? ?Social Worker Clinical Goal(s):  ?patient will work with SW to identify and address any acute and/or chronic care coordination needs related to the self health management of  Traumatic Brain Injury with loss of consciousness of 1 to 5 hours 59 minutes, Acquired Hypothyroidism, Seizures   ?explore community resource options for unmet needs related to:  Transportation ?SW Interventions:  ?Inter-disciplinary care team collaboration (see longitudinal plan of care) ?Collaboration with [name], FNP regarding development and update of comprehensive plan of care as evidenced by provider attestation and co-signature ?Telephonic visit completed with the patients son [name] to assist with care coordination needs ?SDoH screening performed to note patient could benefit from transportation resources as her son [name] is  currently providing transportation for all of patients needs ?Determined the patient has [facility] but son unsure is the patient has transportation benefit - unable to locate scanned card in system to verify ?Verbal education provided how to contact customer service to determine if transportation is an available benefit to the patient ?Discussed opportunity to apply for [facility] ([facility] [employer]) for transportation assistance ?Determined [name] is unsure if this mode of transportation would fit the patients needs ?Discussed plan for SW to mail information on this transportation program to the home for review - patients son agreeable ?Discussed plan for follow up with SW regarding mailed resource while patient also engages with  RN Case Manager  to address care management needs ?Scheduled follow up call over the next month ? ?Patient Goals/Self-Care Activities ?patient will: with the help of her son ? -  Review mailed resource information ?-Engage with RN Care Manager to develop an individualized plan of care ?-Contact SW as needed prior to next scheduled call ? ?Follow Up Plan: The care management team will reach out to the patient again over the next 30 days.  ? ?  ?  ? ?Follow Up Plan: SW will follow up with patient by phone over the next month. ?     ?Daneen Schick, BSW, CDP ?Social Worker, Certified Dementia Practitioner ?[facility] / [place] [facility] ?[phone] ? ?   ? ? ? ? ?

## 2021-08-04 NOTE — Patient Instructions (Signed)
Social Worker Visit Information ? ?Goals we discussed today:  ?Patient Goals/Self-Care Activities ?patient will: with the help of her son ? - Review mailed resource information ?-Engage with RN Care Manager to develop an individualized plan of care ?-Contact SW as needed prior to next scheduled call ?  ? ?Materials provided: Yes: verbal and written resource information provided. ? ?Ms. Bettcher was given information about Chronic Care Management services today including:  ?CCM service includes personalized support from designated clinical staff supervised by her physician, including individualized plan of care and coordination with other care providers ?24/7 contact phone numbers for assistance for urgent and routine care needs. ?Service will only be billed when office clinical staff spend 20 minutes or more in a month to coordinate care. ?Only one practitioner may furnish and bill the service in a calendar month. ?The patient may stop CCM services at any time (effective at the end of the month) by phone call to the office staff. ?The patient will be responsible for cost sharing (co-pay) of up to 20% of the service fee (after annual deductible is met). ? ?Patient agreed to services and verbal consent obtained.  ? ?Patient verbalizes understanding of instructions and care plan provided today and agrees to view in [place]. Active MyChart status confirmed with patient.   ? ?Follow up plan: SW will follow up with patient by phone over the next month ? ? ?Daneen Schick, BSW, CDP ?Social Worker, Certified Dementia Practitioner ?[facility] / [place] [facility] ?[phone] ? ?   ? ?

## 2021-08-17 ENCOUNTER — Telehealth: Payer: Medicare HMO

## 2021-08-17 ENCOUNTER — Telehealth: Payer: Self-pay

## 2021-08-17 NOTE — Telephone Encounter (Signed)
?  Care Management  ? ?Follow Up Note ? ? ?08/17/2021 ?Name: Shannon Barry MRN: [id number] DOB: [birth year] ? ? ?Referred by: [name], FNP ?Reason for referral : Chronic Care Management (Initial RN CM Outreach ) ? ?I spoke with son Shannon Barry briefly today, unfortunately he is not available to speak with me at this time but is agreeable to a call back.  ?An unsuccessful telephone outreach was attempted today. The patient was referred to the case management team for assistance with care management and care coordination.  ? ?Follow Up Plan: Telephone follow up appointment with care management team member scheduled for: 09/07/21 ? ?Delsa Sale, RN, BSN, CCM ?Care Management Coordinator ?[facility] Care Management/[facility]  ?Direct Phone: [phone] ? ? ?

## 2021-08-25 ENCOUNTER — Telehealth: Payer: Self-pay

## 2021-08-25 ENCOUNTER — Telehealth: Payer: Medicare HMO

## 2021-08-25 NOTE — Telephone Encounter (Signed)
?  Care Management  ? ?Follow Up Note ? ? ?08/25/2021 ?Name: Shannon Barry MRN: [id number] DOB: [birth year] ? ? ?Referred by: [name], FNP ?Reason for referral : Chronic Care Management (Unsuccessful call) ? ? ?An unsuccessful telephone outreach was attempted today. The patient was referred to the case management team for assistance with care management and care coordination. SW unable to leave a HIPAA compliant voice message due to the voice mailbox being full. ? ?Follow Up Plan: The care management team will reach out to the patient again over the next 21 days.  ? ?Bevelyn Ngo, BSW, CDP ?Social Worker, Certified Dementia Practitioner ?[facility] / [facility] Care Management ?[phone] ? ?   ? ? ?

## 2021-09-07 ENCOUNTER — Telehealth: Payer: Medicare HMO

## 2021-09-07 ENCOUNTER — Ambulatory Visit (INDEPENDENT_AMBULATORY_CARE_PROVIDER_SITE_OTHER): Payer: Medicare HMO

## 2021-09-07 DIAGNOSIS — R569 Unspecified convulsions: Secondary | ICD-10-CM

## 2021-09-07 DIAGNOSIS — S069X3D Unspecified intracranial injury with loss of consciousness of 1 hour to 5 hours 59 minutes, subsequent encounter: Secondary | ICD-10-CM

## 2021-09-07 DIAGNOSIS — E039 Hypothyroidism, unspecified: Secondary | ICD-10-CM

## 2021-09-07 NOTE — Chronic Care Management (AMB) (Signed)
?Chronic Care Management  ? ?CCM RN Visit Note ? ?09/07/2021 ?Name: Shannon Barry MRN: [id number] DOB: 10-19-1950 ? ?Subjective: ?Shannon Barry is a 71 y.o. year old female who is a primary care patient of [name], [place]. The care management team was consulted for assistance with disease management and care coordination needs.   ? ?Engaged with patient by telephone for initial visit in response to provider referral for case management and/or care coordination services.  ? ?Consent to Services:  ?The patient was given information about Chronic Care Management services, agreed to services, and gave verbal consent prior to initiation of services.  Please see initial visit note for detailed documentation.  ? ?Patient agreed to services and verbal consent obtained.  ? ?Assessment: Review of patient past medical history, allergies, medications, health status, including review of consultants reports, laboratory and other test data, was performed as part of comprehensive evaluation and provision of chronic care management services.  ? ?SDOH (Social Determinants of Health) assessments and interventions performed:  Yes, no acute changes  ? ?CCM Care Plan ? ?Allergies  ?Allergen Reactions  ? Penicillins Hives  ?  Has patient had a PCN reaction causing immediate rash, facial/tongue/throat swelling, SOB or lightheadedness with hypotension: No ?Has patient had a PCN reaction causing severe rash involving mucus membranes or skin necrosis: Yes ?Has patient had a PCN reaction that required hospitalization No ?Has patient had a PCN reaction occurring within the last 10 years: Yes ?If all of the above answers are "NO", then may proceed with Cephalosporin use. ?  ? Sulfa Antibiotics Other (See Comments)  ?  violently ill  ? ? ?Outpatient Encounter Medications as of 09/07/2021  ?Medication Sig  ? cetirizine (ZYRTEC ALLERGY) 10 MG tablet Take 1 tablet (10 mg total) by mouth daily.  ? desvenlafaxine (PRISTIQ) 50 MG 24 hr tablet Take 1  tablet (50 mg total) by mouth daily.  ? divalproex (DEPAKOTE ER) 500 MG 24 hr tablet Take 1 tablet (500 mg total) by mouth daily.  ? lamoTRIgine (LAMICTAL XR) 100 MG 24 hour tablet TAKE 1 TABLET BY MOUTH EVERYDAY AT BEDTIME  ? levothyroxine (SYNTHROID) 50 MCG tablet Take 1 tablet (50 mcg total) by mouth daily at 6 (six) AM.  ? ?No facility-administered encounter medications on file as of 09/07/2021.  ? ? ?Patient Active Problem List  ? Diagnosis Date Noted  ? Acute metabolic encephalopathy 123456  ? 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 History of alcohol abuse 11/10/2015  ? Alzheimer's disease ([place]) 11/10/2015  ? 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 Alcohol abuse 09/24/2015  ? OCD (obsessive compulsive disorder) 09/24/2015  ? Hyponatremia 07/11/2015  ? Chest pain 07/11/2015  ? Nausea and vomiting 07/11/2015  ? Seizures ([place]) 07/11/2015  ? Vertigo   ? 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 TBI (traumatic brain injury) ([place]) 05/04/2015  ? 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 Dementia with behavioral disturbance ([place]) 03/18/2015  ? 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 ? 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 ?Care Plan : RN Care Manager Plan of Care  ?Updates made by Lynne Logan, RN since 09/07/2021 12:00 AM  ?  ? 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  ? 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, subsequent encounter, Acquired hypothyroidism, Seizures disease process and self health management plan as evidenced by patient will report having no disease exacerbations related to her chronic disease states as listed above  ?take all medications exactly as prescribed and will call provider for medication related questions as evidenced by patient will demonstrate improved understanding of prescribed medications and rationale for usage as evidenced by patient teach back ?demonstrate Ongoing health management independence as evidenced by patient will report 100% adherence to her prescribed treatment plan as directed  ?continue to work with RN Care Manager to address care management and care coordination needs related to  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  ? ?Interventions: ?1:1 collaboration with primary care provider regarding development  and update of comprehensive plan of care as evidenced by provider attestation and co-signature ?Inter-disciplinary care team collaboration (see longitudinal plan of care) ?Evaluation of current treatment plan related to self management and patient's adherence to plan as established by provider ? 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 ?Acquired hypothyroidism: (Status:  New goal.)  Long Term Goal ?Evaluation of current treatment plan related to Hypothyroidism, Transportation self-management and patient's adherence to plan as established by provider ?Review of patient status, including review of consultant's reports, relevant laboratory and other test results, and medications completed ?Reviewed medications with patient and discussed importance of medication adherence, confirmed patient is taking Levothyroxine exactly as prescribed, first thing in the am 30 minutes before eating/drinking or taking other medications  ?Discussed plans with patient for ongoing care management follow up and provided patient with direct contact information for care management team  ? ?Seizure disorder:  (Status:  New goal.)  Long Term Goal ?Completed successful outbound call with patient's son and caregiver [name] ?Evaluation of current treatment plan related to  Seizures , Transportation self-management and patient's adherence to plan as established by provider ?  Review of patient status, including review of consultant's reports, relevant laboratory and  other test results, and medications completed ?Reviewed medications with patient and discussed importance of medication adherence including the importance of taking Seizure medications at the same time everyday w

## 2021-09-07 NOTE — Patient Instructions (Signed)
Visit Information  ? ?Thank you for taking time to visit with me today. Please don't hesitate to contact me if I can be of assistance to you before our next scheduled telephone appointment. ? ?Following are the goals we discussed today:  ?(Copy and paste patient goals from clinical care plan here) ? ?Our next appointment is by telephone on 10/07/21 at 10:30 AM ? ?Please call the care guide team at [phone] if you need to cancel or reschedule your appointment.  ? ?If you are experiencing a Mental Health or [place] or need someone to talk to, please call 1-800-273-TALK (toll free, 24 hour hotline)  ? ?Following is a copy of your full care plan:  ?Care Plan : [place] of Care  ?Updates made by Lynne Logan, RN since 09/07/2021 12:00 AM  ?  ? 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  ? 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, subsequent encounter, Acquired hypothyroidism, Seizures disease process and self health management plan as evidenced by patient will report having no disease  exacerbations related to her chronic disease states as listed above  ?take all medications exactly as prescribed and will call provider for medication related questions as evidenced by patient will demonstrate improved understanding of prescribed medications and rationale for usage as evidenced by patient teach back ?demonstrate Ongoing health management independence as evidenced by patient will report 100% adherence to her prescribed treatment plan as directed  ?continue to work with RN Care Manager to address care management and care coordination needs related to  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  ? ?Interventions: ?1:1 collaboration with primary care provider regarding development and update of comprehensive plan of care as evidenced by provider attestation and co-signature ?Inter-disciplinary care team collaboration (see longitudinal plan of care) ?Evaluation of current treatment plan related to self management and patient's adherence to plan as established by provider ? 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 ?Acquired hypothyroidism: (Status:  New goal.)  Long Term Goal ?Evaluation of current treatment plan related to Hypothyroidism, Transportation self-management and patient's adherence to plan as established by provider ?Review of patient status, including review of consultant's reports, relevant laboratory and other test results, and medications completed ?Reviewed medications with patient and discussed importance of medication adherence, confirmed patient is taking Levothyroxine exactly as prescribed, first thing in the am 30 minutes before eating/drinking or taking other medications  ?Discussed plans with patient for ongoing care management follow up and provided patient with direct contact information for care management team  ? ?Seizure disorder:  (Status:  New goal.)  Long Term Goal ?Completed successful outbound call with patient's son and caregiver [name] ?Evaluation of current treatment plan related to  Seizures , Transportation self-management and patient's adherence to plan as established by provider ?Review of patient status, including review of consultant's reports, relevant laboratory and other test results, and medications completed ?Reviewed medications with patient and discussed importance of medication adherence including the importance of taking Seizure medications at the same time everyday with no missed doses ?Determined patient completed recent Neuro follow up for Seizures with the following Assessment/Plan noted: ?We will continue divalproex ER 566m daily and lamotrigine XR 1012mdaily ?Please make sure you are staying well hydrated. I recommend 50-60 ounces daily. Well balanced diet and regular exercise encouraged. Consistent sleep schedule with 6-8 hours recommended.  ?Please continue follow up with care team as directed.  ?Follow up with me in  year  ?Determined patient  experienced a Seizure approximately 2 weeks ago in her home, her son witnessed the Seizure, patient did not lose consciousness, she was alert and oriented approximately 5 minutes after the Seizure occurred, son called EMS to evaluation, he reports no injuries were noted ?Educated son on the importance to record any/all Seizure activity and to make the Neurologist aware of changes in Seizure status and or cognitive/mood changes that may be associated with persistent Seizure activity ?Discussed plans with patient for ongoing care management follow up and provided patient with direct contact information for care management team ? ?Patient Goals/Self-Care Activities: ?Take all medications as prescribed ?Attend all scheduled provider appointments ?Call pharmacy for medication refills 3-7 days in advance of running out of medications ?Call provider office for new concerns or questions  ? ?Follow Up Plan: Initial RN Care Manager telephone outreach is scheduled for: /23 ?  ? ? ?Consent to CCM Services: ?Ms. McKosaas given information about Chronic Care Management services including:  ?CCM service includes personalized support from designated clinical staff supervised by her physician, including individualized plan of care and coordination with other care providers ?24/7 contact phone numbers for assistance for urgent and routine care needs. ?Service will only be billed when office clinical staff spend 20 minutes or more in a month to coordinate care. ?Only one practitioner may furnish and bill the service in a calendar month. ?The patient may stop CCM services at any time (effective at the end of the month) by phone call to the office staff. ?The patient will be responsible for cost sharing (co-pay) of up to 20% of the service fee (after annual deductible is met). ? ?Patient agreed to services and verbal consent obtained.  ? ?Patient verbalizes understanding of instructions and care plan provided today and agrees to view  in [place]. Active MyChart status confirmed with patient.   ? ?Telephone follow up appointment with care management team member scheduled for: 10/07/21 ? ? ?  ?

## 2021-09-13 ENCOUNTER — Ambulatory Visit: Payer: Medicare HMO

## 2021-09-13 DIAGNOSIS — S069X3D Unspecified intracranial injury with loss of consciousness of 1 hour to 5 hours 59 minutes, subsequent encounter: Secondary | ICD-10-CM

## 2021-09-13 DIAGNOSIS — R569 Unspecified convulsions: Secondary | ICD-10-CM

## 2021-09-13 DIAGNOSIS — E039 Hypothyroidism, unspecified: Secondary | ICD-10-CM

## 2021-09-13 NOTE — Patient Instructions (Addendum)
Social Worker Visit Information ? ?Patient Goals/Self-Care Activities ?patient will: with the help of her son ? -  Review mailed resource information ?-Engage with RN Care Manager to develop an individualized plan of care ?-Contact SW as needed  ? ?Follow Up Plan:  No follow up planned at this time. Please contact me as needed. ? ? ?Daneen Schick, BSW, CDP ?Social Worker, Certified Dementia Practitioner ?[facility] ?[phone] ? ?   ? ?

## 2021-09-13 NOTE — Chronic Care Management (AMB) (Signed)
?Chronic Care Management  ? ? Social Work Note ? ?09/13/2021 ?Name: Aniah Pauli MRN: [id number] DOB: [birth year] ? ?Hannie Shoe is a 71 y.o. year old female who is a primary care patient of [name], FNP. The CCM team was consulted to assist the patient with chronic disease management and/or care coordination needs related to:  Traumatic Brain Injury with loss of consciousness of 1 to 5 hours 59 minutes, Seizures, Acquired Hypothyroidism .  ? ?Engaged with patients son by telephone  for follow up visit in response to provider referral for social work chronic care management and care coordination services.  ? ?Consent to Services:  ?The patient was given information about Chronic Care Management services, agreed to services, and gave verbal consent prior to initiation of services.  Please see initial visit note for detailed documentation.  ? ?Patient agreed to services and consent obtained.  ? ?Assessment: Review of patient past medical history, allergies, medications, and health status, including review of relevant consultants reports was performed today as part of a comprehensive evaluation and provision of chronic care management and care coordination services.    ? ?SDOH (Social Determinants of Health) assessments and interventions performed:   ? ?Advanced Directives Status: Not addressed in this encounter. ? ?CCM Care Plan ? ?Allergies  ?Allergen Reactions  ? Penicillins Hives  ?  Has patient had a PCN reaction causing immediate rash, facial/tongue/throat swelling, SOB or lightheadedness with hypotension: No ?Has patient had a PCN reaction causing severe rash involving mucus membranes or skin necrosis: Yes ?Has patient had a PCN reaction that required hospitalization No ?Has patient had a PCN reaction occurring within the last 10 years: Yes ?If all of the above answers are "NO", then may proceed with Cephalosporin use. ?  ? Sulfa Antibiotics Other (See Comments)  ?  violently ill  ? ? ?Outpatient Encounter  Medications as of 09/13/2021  ?Medication Sig  ? cetirizine (ZYRTEC ALLERGY) 10 MG tablet Take 1 tablet (10 mg total) by mouth daily.  ? desvenlafaxine (PRISTIQ) 50 MG 24 hr tablet Take 1 tablet (50 mg total) by mouth daily.  ? divalproex (DEPAKOTE ER) 500 MG 24 hr tablet Take 1 tablet (500 mg total) by mouth daily.  ? lamoTRIgine (LAMICTAL XR) 100 MG 24 hour tablet TAKE 1 TABLET BY MOUTH EVERYDAY AT BEDTIME  ? levothyroxine (SYNTHROID) 50 MCG tablet Take 1 tablet (50 mcg total) by mouth daily at 6 (six) AM.  ? ?No facility-administered encounter medications on file as of 09/13/2021.  ? ? ?Patient Active Problem List  ? Diagnosis Date Noted  ? Acute metabolic encephalopathy 03/14/2019  ? 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 History of alcohol abuse 11/10/2015  ? Alzheimer's disease ([facility]) 11/10/2015  ? 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 Alcohol abuse 09/24/2015  ? OCD (obsessive compulsive disorder) 09/24/2015  ? Hyponatremia 07/11/2015  ? Chest pain 07/11/2015  ? Nausea and vomiting 07/11/2015  ? Seizures ([facility]) 07/11/2015  ? Vertigo   ? 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 TBI (traumatic brain injury) ([facility]) 05/04/2015  ? 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 Dementia with behavioral disturbance ([facility]) 03/18/2015  ? 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 ? 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 ?Care Plan : Social Work Plan of Care  ?Updates made by Bevelyn Ngo since 09/13/2021 12:00  AM  ?Completed 09/13/2021  ? ?Problem: Barriers to Treatment Resolved 09/13/2021  ?  ? ?Goal: Barriers to Treatment Identified and Managed Completed 09/13/2021  ?Start Date: 08/04/2021  ?Priority: High  ?Note:   ?Current Barriers:  ?Chronic disease management support and education needs related to   Traumatic Brain Injury with loss of consciousness of 1 to 5 hours 59 minutes, Acquired Hypothyroidism, Seizures   ?Transportation ? ?Social Worker Clinical Goal(s):  ?patient will work with SW to identify and address any acute and/or chronic care coordination needs related to the self health management of  Traumatic Brain Injury with loss of consciousness of 1 to 5 hours 59 minutes, Acquired Hypothyroidism, Seizures   ?explore community resource options for unmet needs related to:  Transportation ?SW Interventions:  ?Inter-disciplinary care team collaboration (see longitudinal plan of care) ?Collaboration with [name], FNP regarding development and update of comprehensive plan of care as evidenced by provider attestation and co-signature ?Successful outbound call placed to the patients son during scheduled telephonic visit. Patients son [name] requests a call later in the afternoon ?Unsuccessful call placed at later requested time ?SW will close patient goal with option to reopen pending a return call from patients son ? ?Patient Goals/Self-Care Activities ?patient will: with the help of her son ? -  Review mailed resource information ?-Engage with RN Care Manager to develop an individualized plan of care ?-Contact SW as needed  ? ? ?  ?  ? ?Follow Up Plan:  No planned SW follow up at this time. Patient will remain engaged with RN Care Manager. ?     ?Bevelyn Ngo, BSW, CDP ?Social Worker, Certified Dementia Practitioner ?[facility] ?[phone] ? ?   ? ? ? ? ?

## 2021-09-15 ENCOUNTER — Ambulatory Visit: Payer: Medicare Other | Admitting: Family Medicine

## 2021-09-26 DIAGNOSIS — E039 Hypothyroidism, unspecified: Secondary | ICD-10-CM

## 2021-10-05 ENCOUNTER — Ambulatory Visit (INDEPENDENT_AMBULATORY_CARE_PROVIDER_SITE_OTHER): Payer: Medicare HMO | Admitting: Nurse Practitioner

## 2021-10-05 ENCOUNTER — Encounter: Payer: Self-pay | Admitting: Nurse Practitioner

## 2021-10-05 VITALS — BP 106/80 | HR 70 | Temp 98.1°F | Ht 63.0 in | Wt 112.0 lb

## 2021-10-05 DIAGNOSIS — Z23 Encounter for immunization: Secondary | ICD-10-CM | POA: Diagnosis not present

## 2021-10-05 DIAGNOSIS — R5383 Other fatigue: Secondary | ICD-10-CM

## 2021-10-05 DIAGNOSIS — R569 Unspecified convulsions: Secondary | ICD-10-CM | POA: Diagnosis not present

## 2021-10-05 DIAGNOSIS — E039 Hypothyroidism, unspecified: Secondary | ICD-10-CM | POA: Diagnosis not present

## 2021-10-05 DIAGNOSIS — S069X5S Unspecified intracranial injury with loss of consciousness greater than 24 hours with return to pre-existing conscious level, sequela: Secondary | ICD-10-CM

## 2021-10-05 DIAGNOSIS — E871 Hypo-osmolality and hyponatremia: Secondary | ICD-10-CM | POA: Diagnosis not present

## 2021-10-05 DIAGNOSIS — F3160 Bipolar disorder, current episode mixed, unspecified: Secondary | ICD-10-CM

## 2021-10-05 DIAGNOSIS — F1027 Alcohol dependence with alcohol-induced persisting dementia: Secondary | ICD-10-CM

## 2021-10-05 DIAGNOSIS — R0982 Postnasal drip: Secondary | ICD-10-CM

## 2021-10-05 DIAGNOSIS — Z0000 Encounter for general adult medical examination without abnormal findings: Secondary | ICD-10-CM | POA: Diagnosis not present

## 2021-10-05 DIAGNOSIS — H9193 Unspecified hearing loss, bilateral: Secondary | ICD-10-CM

## 2021-10-05 LAB — POCT URINALYSIS DIPSTICK
Bilirubin, UA: NEGATIVE
Blood, UA: NEGATIVE
Glucose, UA: NEGATIVE
Ketones, UA: NEGATIVE
Nitrite, UA: NEGATIVE
Protein, UA: NEGATIVE
Spec Grav, UA: 1.015 (ref 1.010–1.025)
Urobilinogen, UA: 0.2 E.U./dL
pH, UA: 7.5 (ref 5.0–8.0)

## 2021-10-05 MED ORDER — DESVENLAFAXINE SUCCINATE ER 50 MG PO TB24: 50.0000 mg | ORAL_TABLET | Freq: Every day | ORAL | 1 refills | Status: DC

## 2021-10-05 MED ORDER — CETIRIZINE HCL 10 MG PO TABS: 10.0000 mg | ORAL_TABLET | Freq: Every day | ORAL | 1 refills | Status: AC

## 2021-10-05 MED ORDER — LEVOTHYROXINE SODIUM 88 MCG PO TABS: 88.0000 ug | ORAL_TABLET | Freq: Every day | ORAL | 1 refills | Status: DC

## 2021-10-05 NOTE — Patient Instructions (Addendum)
Health Maintenance, Female ?Adopting a healthy lifestyle and getting preventive care are important in promoting health and wellness. Ask your health care provider about: ?The right schedule for you to have regular tests and exams. ?Things you can do on your own to prevent diseases and keep yourself healthy. ?What should I know about diet, weight, and exercise? ?Eat a healthy diet ? ?Eat a diet that includes plenty of vegetables, fruits, low-fat dairy products, and lean protein. ?Do not eat a lot of foods that are high in solid fats, added sugars, or sodium. ?Maintain a healthy weight ?Body mass index (BMI) is used to identify weight problems. It estimates body fat based on height and weight. Your health care provider can help determine your BMI and help you achieve or maintain a healthy weight. ?Get regular exercise ?Get regular exercise. This is one of the most important things you can do for your health. Most adults should: ?Exercise for at least 150 minutes each week. The exercise should increase your heart rate and make you sweat (moderate-intensity exercise). ?Do strengthening exercises at least twice a week. This is in addition to the moderate-intensity exercise. ?Spend less time sitting. Even light physical activity can be beneficial. ?Watch cholesterol and blood lipids ?Have your blood tested for lipids and cholesterol at 70 years of age, then have this test every 5 years. ?Have your cholesterol levels checked more often if: ?Your lipid or cholesterol levels are high. ?You are older than 70 years of age. ?You are at high risk for heart disease. ?What should I know about cancer screening? ?Depending on your health history and family history, you may need to have cancer screening at various ages. This may include screening for: ?Breast cancer. ?Cervical cancer. ?Colorectal cancer. ?Skin cancer. ?Lung cancer. ?What should I know about heart disease, diabetes, and high blood pressure? ?Blood pressure and heart  disease ?High blood pressure causes heart disease and increases the risk of stroke. This is more likely to develop in people who have high blood pressure readings or are overweight. ?Have your blood pressure checked: ?Every 3-5 years if you are 19-36 years of age. ?Every year if you are 48 years old or older. ?Diabetes ?Have regular diabetes screenings. This checks your fasting blood sugar level. Have the screening done: ?Once every three years after age 18 if you are at a normal weight and have a low risk for diabetes. ?More often and at a younger age if you are overweight or have a high risk for diabetes. ?What should I know about preventing infection? ?Hepatitis B ?If you have a higher risk for hepatitis B, you should be screened for this virus. Talk with your health care provider to find out if you are at risk for hepatitis B infection. ?Hepatitis C ?Testing is recommended for: ?Everyone born from [birth year] through 1964. ?Anyone with known risk factors for hepatitis C. ?Sexually transmitted infections (STIs) ?Get screened for STIs, including gonorrhea and chlamydia, if: ?You are sexually active and are younger than 70 years of age. ?You are older than 70 years of age and your health care provider tells you that you are at risk for this type of infection. ?Your sexual activity has changed since you were last screened, and you are at increased risk for chlamydia or gonorrhea. Ask your health care provider if you are at risk. ?Ask your health care provider about whether you are at high risk for HIV. Your health care provider may recommend a prescription medicine to help prevent HIV  infection. If you choose to take medicine to prevent HIV, you should first get tested for HIV. You should then be tested every 3 months for as long as you are taking the medicine. ?Pregnancy ?If you are about to stop having your period (premenopausal) and you may become pregnant, seek counseling before you get pregnant. ?Take 400 to 800  micrograms (mcg) of folic acid every day if you become pregnant. ?Ask for birth control (contraception) if you want to prevent pregnancy. ?Osteoporosis and menopause ?Osteoporosis is a disease in which the bones lose minerals and strength with aging. This can result in bone fractures. If you are 30 years old or older, or if you are at risk for osteoporosis and fractures, ask your health care provider if you should: ?Be screened for bone loss. ?Take a calcium or vitamin D supplement to lower your risk of fractures. ?Be given hormone replacement therapy (HRT) to treat symptoms of menopause. ?Follow these instructions at home: ?Alcohol use ?Do not drink alcohol if: ?Your health care provider tells you not to drink. ?You are pregnant, may be pregnant, or are planning to become pregnant. ?If you drink alcohol: ?Limit how much you have to: ?0-1 drink a day. ?Know how much alcohol is in your drink. In the [place] one drink equals one 12 oz bottle of beer (355 mL), one 5 oz glass of wine (148 mL), or one 1? oz glass of hard liquor (44 mL). ?Lifestyle ?Do not use any products that contain nicotine or tobacco. These products include cigarettes, chewing tobacco, and vaping devices, such as e-cigarettes. If you need help quitting, ask your health care provider. ?Do not use street drugs. ?Do not share needles. ?Ask your health care provider for help if you need support or information about quitting drugs. ?General instructions ?Schedule regular health, dental, and eye exams. ?Stay current with your vaccines. ?Tell your health care provider if: ?You often feel depressed. ?You have ever been abused or do not feel safe at home. ?Summary ?Adopting a healthy lifestyle and getting preventive care are important in promoting health and wellness. ?Follow your health care provider's instructions about healthy diet, exercising, and getting tested or screened for diseases. ?Follow your health care provider's instructions on monitoring your  cholesterol and blood pressure. ?This information is not intended to replace advice given to you by your health care provider. Make sure you discuss any questions you have with your health care provider. ?Document Revised: 10/05/2020 Document Reviewed: 10/05/2020 ?Elsevier Patient Education ? 2023 Elsevier Inc. ? ? ?Call [facility]  [phone] to schedule Colonoscopy ? ?Call the [facility] ?Address: [address], [place], [place] [address] ?Phone: [phone] ? ?

## 2021-10-05 NOTE — Progress Notes (Signed)
Subjective:    Shannon Barry is a 71 y.o. female who presents for a Welcome to Medicare exam.   Review of Systems  Cardiac Risk Factors include: advanced age (>66mn, >>73women)      Objective:    Today's Vitals   10/05/21 1604  BP: 106/80  Pulse: 70  Temp: 98.1 F (36.7 C)  TempSrc: Oral  SpO2: 98%  Weight: 112 lb (50.8 kg)  Height: _0  (1.6 m)  Body mass index is 19.84 kg/m.  Medications Outpatient Encounter Medications as of 10/05/2021  Medication Sig   cetirizine (ZYRTEC ALLERGY) 10 MG tablet Take 1 tablet (10 mg total) by mouth daily.   divalproex (DEPAKOTE ER) 500 MG 24 hr tablet Take 1 tablet (500 mg total) by mouth daily.   lamoTRIgine (LAMICTAL XR) 100 MG 24 hour tablet TAKE 1 TABLET BY MOUTH EVERYDAY AT BEDTIME   levothyroxine (SYNTHROID) 50 MCG tablet Take 1 tablet (50 mcg total) by mouth daily at 6 (six) AM.   levothyroxine (SYNTHROID) 88 MCG tablet Take by mouth.   desvenlafaxine (PRISTIQ) 50 MG 24 hr tablet Take 1 tablet (50 mg total) by mouth daily.   No facility-administered encounter medications on file as of 10/05/2021.     History: Past Medical History:  Diagnosis Date   Allergy    Depression    Seizures ([place]    Thyroid disease    Traumatic brain injury ([facility]    Past Surgical History:  Procedure Laterality Date   right hand surgey  Right    shoulder injury  Left    traumatic head injury  N/A    URETERAL REIMPLANTATION OF TRANSPLANTED KIDNEY N/A     History reviewed. No pertinent family history. Social History   Occupational History   Not on file  Tobacco Use   Smoking status: Never   Smokeless tobacco: Never  Vaping Use   Vaping Use: Never used  Substance and Sexual Activity   Alcohol use: No    Alcohol/week: 0.0 standard drinks   Drug use: No   Sexual activity: Yes    Tobacco Counseling Counseling given: Yes   Immunizations and Health Maintenance Immunization History  Administered Date(s) Administered   Moderna  Sars-Covid-2 Vaccination 08/16/2019, 09/19/2019   Health Maintenance Due  Topic Date Due   TETANUS/TDAP  Never done   COLONOSCOPY (Pts 45-483yrInsurance coverage will need to be confirmed)  Never done   Zoster Vaccines- Shingrix (1 of 2) Never done   Pneumonia Vaccine 6542Years old (1 - PCV) Never done   DEXA SCAN  Never done   MAMMOGRAM  08/17/2018   COVID-19 Vaccine (3 - Booster for Moderna series) 11/14/2019    Activities of Daily Living    10/05/2021    4:00 PM 05/06/2021    9:10 AM  In your present state of health, do you have any difficulty performing the following activities:  Hearing? 0 0  Vision? 1 1  Difficulty concentrating or making decisions? 1 1  Walking or climbing stairs? 0 0  Dressing or bathing? 0 0  Doing errands, shopping? 0 0  Preparing Food and eating ? N   Using the Toilet? N   In the past six months, have you accidently leaked urine? N   Do you have problems with loss of bowel control? N   Managing your Finances? N   Housekeeping or managing your Housekeeping? N     Physical Exam  (optional), or other factors deemed appropriate based on  the beneficiary's medical and social history and current clinical standards.  Advanced Directives: Does Patient Have a Medical Advance Directive?: No    Assessment:    This is a routine wellness examination for this patient .   Vision/Hearing screen Hearing Screening   _0  _1  _2  _3   Right ear Pass Fail Fail Fail  Left ear Fail Pass Fail Pass   Vision Screening   Right eye Left eye Both eyes  Without correction     With correction _4    Dietary issues and exercise activities discussed:  Current Exercise Habits: Home exercise routine (walks her dog regularly and takes one long walk a day), Type of exercise: walking, Time (Minutes): > 60, Frequency (Times/Week): 7, Weekly Exercise (Minutes/Week): 0, Intensity: Mild   Goals      Patient Stated     "I want to be able to still  walk"      Depression Screen    10/05/2021    3:56 PM 03/18/2015    3:59 PM 03/10/2015    7:16 PM  PHQ 2/9 Scores  PHQ - 2 Score 0 2 0  PHQ- 9 Score  11      Fall Risk    10/05/2021    3:59 PM  Fall Risk   Falls in the past year? 1  Number falls in past yr: 1  Injury with Fall? 1  Risk for fall due to : History of fall(s)  Follow up Falls evaluation completed    Cognitive Function:    10/31/2017    3:11 PM 08/03/2016   10:45 AM 04/05/2016    1:28 PM 11/10/2015    1:30 PM 09/24/2015    2:28 PM  MMSE - Mini Mental State Exam  Orientation to time _5 Orientation to Place _6 Registration _7 Attention/ Calculation _8 Recall _9 Language- name 2 objects _10 Language- repeat _11 Language- follow 3 step command _12 Language- read & follow direction _13 Write a sentence _14 Copy design 1 0 1 0 0  Total score _15 10/05/2021    4:00 PM  6CIT Screen  What Year? 4 points  What month? 0 points  What time? 3 points  Count back from 20 2 points  Months in reverse 0 points  Repeat phrase 0 points  Total Score 9 points    Patient Care Team: [name], FNP as PCP - General ([place]) Little, Claudette Stapler, RN as [place]:   1. Encounter for Medicare annual wellness exam Pt's annual wellness exam was performed and geriatric assessment reviewed.  Pt has no new identiafble wellness concerns at this time.  WIll obtain routine labs.  Will obtain UA and micro.  Behavior modifications discussed and diet history reviewed. Pt will continue to exercise regularly and modify diet, with low GI, plant based foods and decrease food intake of processed foods.  Recommend intake of daily multivitamin, Vitamin D, and calcium. Recommond mammogram and colonoscopy for preventive screenings, as well as recommend immunizations that include influenza (up to  date) and TDAP EKG done with NSR HR 69 - EKG 12-Lead -  POCT Urinalysis Dipstick (81002) - Microalbumin / creatinine urine ratio - CMP14+EGFR  2. Acquired hypothyroidism Will recheck levels was over active at last visit - TSH - T4 - T3, free - levothyroxine (SYNTHROID) 88 MCG tablet; Take 1 tablet (88 mcg total) by mouth daily before breakfast. (Patient taking differently: Take 88 mcg by mouth daily before breakfast. Take 2 tablets on Sundays)  Dispense: 90 tablet; Refill: 1  3. Seizures ([place]) Reports having seizure a few weeks ago without any issues, did not seek medical attention, she is encouraged to follow up with Neurology  4. Hyponatremia  - CMP14+EGFR  5. Decreased energy Will check for metabolic causes  - CBC  6. 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 Post-nasal drainage She is to take cetirizine for her allergies.  - cetirizine (ZYRTEC ALLERGY) 10 MG tablet; Take 1 tablet (10 mg total) by mouth daily.  Dispense: 90 tablet; Refill: 1  10. Immunization due Administered in office - Pneumococcal conjugate vaccine 20-valent (Prevnar 20) - Tdap vaccine greater than or equal to 7yo IM   I have personally reviewed and noted the following in the patient's chart:   Medical and social history Use of alcohol, tobacco or illicit drugs  Current medications and supplements Functional ability and status Nutritional status Physical activity Advanced directives List of other physicians Hospitalizations, surgeries, and ER visits in previous 12 months Vitals Screenings to include cognitive, depression, and falls Referrals and appointments  In addition, I have reviewed and  discussed with patient certain preventive protocols, quality metrics, and best practice recommendations. A written personalized care plan for preventive services as well as general preventive health recommendations were provided to patient.     [name], FNP 10/05/2021

## 2021-10-06 LAB — MICROALBUMIN / CREATININE URINE RATIO
Creatinine, Urine: 35.9 mg/dL
Microalb/Creat Ratio: 26 mg/g creat (ref 0–29)
Microalbumin, Urine: 9.3 ug/mL

## 2021-10-06 LAB — CMP14+EGFR
ALT: 16 IU/L (ref 0–32)
AST: 24 IU/L (ref 0–40)
Albumin/Globulin Ratio: 2.1 (ref 1.2–2.2)
Albumin: 4.4 g/dL (ref 3.8–4.8)
Alkaline Phosphatase: 70 IU/L (ref 44–121)
BUN/Creatinine Ratio: 17 (ref 12–28)
BUN: 9 mg/dL (ref 8–27)
Bilirubin Total: <0.2 mg/dL (ref 0.0–1.2)
CO2: 26 mmol/L (ref 20–29)
Calcium: 9.7 mg/dL (ref 8.7–10.3)
Chloride: 97 mmol/L (ref 96–106)
Creatinine, Ser: 0.53 mg/dL — ABNORMAL LOW (ref 0.57–1.00)
Globulin, Total: 2.1 g/dL (ref 1.5–4.5)
Glucose: 89 mg/dL (ref 70–99)
Potassium: 5.8 mmol/L — ABNORMAL HIGH (ref 3.5–5.2)
Sodium: 136 mmol/L (ref 134–144)
Total Protein: 6.5 g/dL (ref 6.0–8.5)
eGFR: 100 mL/min/{1.73_m2} (ref >59)

## 2021-10-06 LAB — CBC
Hematocrit: 41.5 % (ref 34.0–46.6)
Hemoglobin: 14 g/dL (ref 11.1–15.9)
MCH: 31.3 pg (ref 26.6–33.0)
MCHC: 33.7 g/dL (ref 31.5–35.7)
MCV: 93 fL (ref 79–97)
Platelets: 202 10*3/uL (ref 150–450)
RBC: 4.48 x10E6/uL (ref 3.77–5.28)
RDW: 12.2 % (ref 11.7–15.4)
WBC: 4.9 10*3/uL (ref 3.4–10.8)

## 2021-10-06 LAB — T3, FREE: T3, Free: 1.8 pg/mL — ABNORMAL LOW (ref 2.0–4.4)

## 2021-10-06 LAB — TSH: TSH: 1.18 u[IU]/mL (ref 0.450–4.500)

## 2021-10-06 LAB — T4: T4, Total: 7.7 ug/dL (ref 4.5–12.0)

## 2021-10-07 ENCOUNTER — Telehealth: Payer: Medicare HMO

## 2021-10-07 ENCOUNTER — Ambulatory Visit (INDEPENDENT_AMBULATORY_CARE_PROVIDER_SITE_OTHER): Payer: Medicare HMO

## 2021-10-07 DIAGNOSIS — E039 Hypothyroidism, unspecified: Secondary | ICD-10-CM

## 2021-10-07 DIAGNOSIS — S069X3D Unspecified intracranial injury with loss of consciousness of 1 hour to 5 hours 59 minutes, subsequent encounter: Secondary | ICD-10-CM

## 2021-10-07 DIAGNOSIS — R569 Unspecified convulsions: Secondary | ICD-10-CM

## 2021-10-07 NOTE — Patient Instructions (Signed)
Visit Information ? ?Thank you for taking time to visit with me today. Please don't hesitate to contact me if I can be of assistance to you before our next scheduled telephone appointment. ? ?Following are the goals we discussed today:  ?(Copy and paste patient goals from clinical care plan here) ? ?Our next appointment is by telephone on 12/02/21 at 10:30 AM ? ?Please call the care guide team at [phone] if you need to cancel or reschedule your appointment.  ? ?If you are experiencing a Mental Health or Behavioral Health Crisis or need someone to talk to, please call 1-800-273-TALK (toll free, 24 hour hotline)  ? ?Patient verbalizes understanding of instructions and care plan provided today and agrees to view in MyChart. Active MyChart status confirmed with patient.   ? ?Delsa Sale, RN, BSN, CCM ?Care Management Coordinator ?[facility]  ?Direct Phone: [phone] ? ? ?

## 2021-10-07 NOTE — Chronic Care Management (AMB) (Signed)
?Chronic Care Management  ? ?CCM RN Visit Note ? ?10/07/2021 ?Name: Shannon Barry MRN: [id number] DOB: Feb 18, 1951 ? ?Subjective: ?Shannon Barry is a 71 y.o. year old female who is a primary care patient of [name], FNP. The care management team was consulted for assistance with disease management and care coordination needs.   ? ?Engaged with patient by telephone for follow up visit in response to provider referral for case management and/or care coordination services.  ? ?Consent to Services:  ?The patient was given information about Chronic Care Management services, agreed to services, and gave verbal consent prior to initiation of services.  Please see initial visit note for detailed documentation.  ? ?Patient agreed to services and verbal consent obtained.  ? ?Assessment: Review of patient past medical history, allergies, medications, health status, including review of consultants reports, laboratory and other test data, was performed as part of comprehensive evaluation and provision of chronic care management services.  ? ?SDOH (Social Determinants of Health) assessments and interventions performed:  Yes, resources needed for [employer], AD ? ?CCM Care Plan ? ?Allergies  ?Allergen Reactions  ? Penicillins Hives  ?  Has patient had a PCN reaction causing immediate rash, facial/tongue/throat swelling, SOB or lightheadedness with hypotension: No ?Has patient had a PCN reaction causing severe rash involving mucus membranes or skin necrosis: Yes ?Has patient had a PCN reaction that required hospitalization No ?Has patient had a PCN reaction occurring within the last 10 years: Yes ?If all of the above answers are "NO", then may proceed with Cephalosporin use. ?  ? Sulfa Antibiotics Other (See Comments)  ?  violently ill  ? ? ?Outpatient Encounter Medications as of 10/07/2021  ?Medication Sig  ? levothyroxine (SYNTHROID) 88 MCG tablet Take 1 tablet (88 mcg total) by mouth daily before breakfast. (Patient taking  differently: Take 88 mcg by mouth daily before breakfast. Take 2 tablets on Sundays)  ? cetirizine (ZYRTEC ALLERGY) 10 MG tablet Take 1 tablet (10 mg total) by mouth daily.  ? desvenlafaxine (PRISTIQ) 50 MG 24 hr tablet Take 1 tablet (50 mg total) by mouth daily.  ? divalproex (DEPAKOTE ER) 500 MG 24 hr tablet Take 1 tablet (500 mg total) by mouth daily.  ? lamoTRIgine (LAMICTAL XR) 100 MG 24 hour tablet TAKE 1 TABLET BY MOUTH EVERYDAY AT BEDTIME  ? ?No facility-administered encounter medications on file as of 10/07/2021.  ? ? ?Patient Active Problem List  ? Diagnosis Date Noted  ? Acute metabolic encephalopathy 03/14/2019  ? 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 History of alcohol abuse 11/10/2015  ? Alzheimer's disease (HCC) 11/10/2015  ? 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 Alcohol abuse 09/24/2015  ? OCD (obsessive compulsive disorder) 09/24/2015  ? Hyponatremia 07/11/2015  ? Chest pain 07/11/2015  ? Nausea and vomiting 07/11/2015  ? Seizures (HCC) 07/11/2015  ? Vertigo   ? 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 TBI (traumatic brain injury) (HCC) 05/04/2015  ? 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 Dementia with behavioral disturbance (HCC) 03/18/2015  ? 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 ? 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 ?Care Plan : RN Care Manager Plan of Care  ?Updates made by Riley Churches, RN since 10/07/2021 12:00 AM  ?  ? ?Problem: No plan of care estastablished for management of  chronic disease states (Traumatic brain injury with loss of consciousness of 1 to 5 hours 59 minutes, subsequent encounter, Acquired hypothyroidism, Seizures)   ?Priority: High  ?  ? ?Long-Range Goal: Establishment of plan of care for management of chronic  disease states (Traumatic brain injury with loss of consciousness of 1 to 5 hours 59 minutes, subsequent encounter, Acquired hypothyroidism, Seizures)   ?Start Date: 08/04/2021  ?Expected End Date: 08/05/2022  ?Recent Progress: On track  ?Priority: High  ?Note:   ?Current Barriers:  ?Knowledge Deficits related to plan of care for management of Traumatic brain injury with loss of consciousness of 1 to 5 hours 59 minutes, subsequent encounter, Acquired hypothyroidism, Seizures  ?Care Coordination needs related to Transportation ? 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, subsequent encounter, Acquired hypothyroidism, Seizures disease process and self health management plan as evidenced by patient will report having no disease exacerbations related to her chronic disease states as listed above  ?take all medications exactly as prescribed and will call provider for medication related questions as evidenced by patient will demonstrate improved understanding of prescribed medications and rationale for usage as evidenced by patient teach back ?demonstrate Ongoing health management independence as evidenced by patient will report 100% adherence to her prescribed treatment plan as directed  ?continue to work with RN Care Manager to address care management and care coordination needs related to  Traumatic brain injury with loss of consciousness of 1 to 5 hours 59 minutes, subsequent encounter, Acquired hypothyroidism, Seizures as evidenced by adherence to CM Team Scheduled appointments ?work with Child psychotherapistsocial worker to address  related to the management of Transportation related to the management of Traumatic brain injury with loss of consciousness of 1 to 5 hours 59 minutes, subsequent encounter, Acquired hypothyroidism, Seizures as evidenced by review of EMR and patient or Child psychotherapistsocial worker report through collaboration with Research officer, trade unionN  Care manager, provider, and care team.  ? ?Interventions: ?1:1 collaboration with primary care provider regarding development and update of comprehensive plan of care as evidenced by provider attestation and co-signature ?Inter-disciplinary care team collaboration (see longitudinal plan of care) ?Evaluation of current treatment plan related to self management and patient's adherence to plan as established by provider ? ?Interdisciplinary Collaboration Interventions:  (Status: New goal.) Long Term Goal   ?Collaborated with BSW to initiate plan of care to address needs related to Transportation in patient with  Traumatic brain injury with loss of consciousness of 1 to 5 hours 59 minutes, subsequent encounter, Acquired hypothyroidism, Seizures ?Collaboration with Arnette FeltsMoore, Janece, FNP regarding development and update of comprehensive plan of care as evidenced by provider attestation and co-signature ?Inter-disciplinary care team collaboration  ? ?Acquired hypothyroidism: (Status:  Goal on track:  Yes.)  Long Term Goal ?Completed successful outbound call with patient's son and caregiver Luisa Hartatrick ?Evaluation of current treatment plan related to Hypothyroidism, Transportation self-management and patient's adherence to plan as established by provider ?Review of patient status, including review of consultant's reports, relevant laboratory and other test results, and medications completed ?Reviewed medications with patient and discussed importance of medication adherence, confirmed patient is taking Levothyroxine exactly as prescribed, first thing in the am 30 minutes before eating/drinking or taking other medications ?Updated medication profile to reflect correct dosing of Levothyroxine   ?Discussed plans with patient for ongoing care management follow up and provided patient with direct contact information for care management team  ? ?Seizure disorder:  (  Status:  Goal on track:  Yes.)  Long Term Goal ?Completed successful  outbound call with patient's son and caregiver [name] ?Evaluation of current treatment plan related to  Seizures , Transportation self-management and patient's adherence to plan as established by provider ?Dete

## 2021-10-14 ENCOUNTER — Telehealth: Payer: Self-pay

## 2021-10-14 ENCOUNTER — Telehealth: Payer: Medicare HMO

## 2021-10-14 NOTE — Telephone Encounter (Signed)
  Care Management   Follow Up Note   10/14/2021 Name: Shannon Barry MRN: [id number] DOB: Oct 14, 1950   Referred by: [name], FNP Reason for referral : Chronic Care Management   Internal referral received from RN Care Manager requesting SW assist with Advanced Directive questions and Silver Sneakers enrollment. Successful outbound call placed to the patients son and caregiver [name]. After introducing reason for today's call, [name] requests to call SW back as he is unavailable at this time.  Follow Up Plan:  Provided direct contact number to patients son who will call SW when he is available to discuss patients care coordination needs.  Bevelyn Ngo, BSW, CDP Social Worker, Certified Dementia Practitioner [facility] Care Management [phone]

## 2021-10-27 ENCOUNTER — Encounter: Payer: Self-pay | Admitting: Nurse Practitioner

## 2021-10-27 DIAGNOSIS — E038 Other specified hypothyroidism: Secondary | ICD-10-CM

## 2021-12-02 ENCOUNTER — Telehealth: Payer: Self-pay

## 2021-12-02 ENCOUNTER — Telehealth: Payer: Medicare HMO

## 2021-12-02 NOTE — Telephone Encounter (Signed)
  Care Management   Follow Up Note   12/02/2021 Name: Shannon Barry MRN: [id number] DOB: 1950-11-17   Referred by: [name], FNP Reason for referral : Chronic Care Management (RN CM Follow up call )   An unsuccessful telephone outreach was attempted today. The patient was referred to the case management team for assistance with care management and care coordination.   Follow Up Plan: Telephone follow up appointment with care management team member scheduled for: 02/09/22  Delsa Sale, RN, BSN, CCM Care Management Coordinator [facility] Care Management/[facility]  Direct Phone: [phone]

## 2021-12-09 ENCOUNTER — Telehealth: Payer: Self-pay | Admitting: Family Medicine

## 2021-12-09 NOTE — Telephone Encounter (Signed)
Pt's daughter called stating that they think its time to switch the pt's Seizure medication due to having a lot more small seizures than before. She would like to know if pt is to come in to the office for this or can the change be called in to the pharmacy. Please advise.

## 2021-12-09 NOTE — Telephone Encounter (Signed)
Called and spoke w/ daughter-in-law, Florentina Addison. Advised I cannot release any info as she is not yet on DPR form. Son not available to speak with. Scheduled appt for 12/13/21 at 1:30pm with Dr. Vickey Huger. Asked she check in by 1:00pm.

## 2021-12-13 ENCOUNTER — Telehealth: Payer: Self-pay | Admitting: Neurology

## 2021-12-13 ENCOUNTER — Encounter: Payer: Self-pay | Admitting: Neurology

## 2021-12-13 ENCOUNTER — Ambulatory Visit: Payer: Medicare HMO | Admitting: Neurology

## 2021-12-13 VITALS — BP 125/74 | HR 88 | Ht 63.0 in | Wt 110.5 lb

## 2021-12-13 DIAGNOSIS — G3184 Mild cognitive impairment, so stated: Secondary | ICD-10-CM | POA: Diagnosis not present

## 2021-12-13 DIAGNOSIS — S069X5S Unspecified intracranial injury with loss of consciousness greater than 24 hours with return to pre-existing conscious level, sequela: Secondary | ICD-10-CM

## 2021-12-13 DIAGNOSIS — Z79899 Other long term (current) drug therapy: Secondary | ICD-10-CM

## 2021-12-13 DIAGNOSIS — R561 Post traumatic seizures: Secondary | ICD-10-CM | POA: Diagnosis not present

## 2021-12-13 DIAGNOSIS — F1021 Alcohol dependence, in remission: Secondary | ICD-10-CM | POA: Diagnosis not present

## 2021-12-13 DIAGNOSIS — F3160 Bipolar disorder, current episode mixed, unspecified: Secondary | ICD-10-CM

## 2021-12-13 DIAGNOSIS — F1027 Alcohol dependence with alcohol-induced persisting dementia: Secondary | ICD-10-CM

## 2021-12-13 MED ORDER — DIVALPROEX SODIUM ER 500 MG PO TB24: 500.0000 mg | ORAL_TABLET | Freq: Every day | ORAL | 3 refills | Status: DC

## 2021-12-13 MED ORDER — LAMOTRIGINE ER 100 MG PO TB24
ORAL_TABLET | ORAL | 3 refills | Status: DC
Start: 2021-12-13 — End: 2021-12-15

## 2021-12-13 NOTE — Telephone Encounter (Signed)
HISTORY OF PRESENT ILLNESS: 07/20/21 ALL: Julayne returns for follow up for seizures. She was last seen 10/12 and reported questionable seizure activity. Event was unwitnessed and patient states she hit her head on fireplace. CT was negative. Labs showed sodium was 126. AED levels stable. She was advised to follow up with PCP regarding hyponatremia. She had a visit with [name], NP on 05/06/2021 to establish care as she had not been seen by previous PCP ([facility] [employer]) in over 2 years. Labs were repeated 05/06/2021. Sodium was 135. T3 was low and Synthroid dose increased. Since, she reports feeling better. Dizziness has significantly improved. No falls. No seizure like activity. She continues lamotrigine XR 200mg  daily and divalproex ER 500mg  daily. She is taking Synthroid first thing in the morning. She is able to manage her meds and perform ADLs independently. 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 She lives with her son, [name], who presents with her today. He states she is "really good right now". She has follow up with PCP 07/28/2021.    03/10/2021 ALL:  Yanet returns for follow up. She reports breakthrough seizure 03/06/2021. She reports waking up around 1am to use the restroom. She was returning to the bed and reports that she was saying "ok, ok, ok". She reports falling in her living room and hit her head on a brick hearth. She remembers "beating" her head several times on the edge of the fireplace. She went back to bed and did not seek medical attention. Event not witnessed. She states that the next morning she told her son (who lives with her) that she had a seizure. Her daughter in law reports seeing her on 03/08/21 and noticed her left eye was black. She doesn't remember being dizzy or lightheaded. No incontinence or tongue injuries. She reports being more emotional the day of the seizure event as her son had gotten married. Daughter in law reports that she had called her several nights the week before  stating that she was not able to sleep. Daughter in law reports she has seemed normal since. No unusual confusion or unusual behavior since. She denies headache. No asa or blood thinners. She reports normal appetite. She drinks about 2 glasses of water daily and several glasses of tea. She takes her levothyroxine every morning but does not take seizure medication at the same time every day. She manages her medication in pill organizer. She denies difficulty remembering to take medications. She does not drive.  She reports dizziness with turning head for the past month. She is having more allergy symptoms and taking antihistamine.    09/15/2020 ALL: Sariya returns for follow up for seizures. She continues lamotrigine and divalproex. She is tolerating medicaitons well. No obvious adverse effects. She is followed regularly by PCP. She plans to update Dexa this year. Memory is stable. She lives in a split level home with her youngest son. Her oldest son makes sure bills are paid but she is able to manage her money, go to the grocery store, etc. She performs ADL independently. She does not drive. Rarely mises doses of medication.    09/12/2019 ALL:  Sonyia Poeschel is a 71 y.o. female here today for follow up for seizure. She continues lamotrigine ER 100mg  daily and divalproex ER 500mg  at bedtime. She is not sure but thinks she may have had a seizure. She reports defecating in her hallway at home. She does not remember the event. She just remembers waking up and seeing the a mess  in the hall and her undergarments were soiled. Event was not witnessed. She does endorse being dehydrated from a bout of diarrhea. No tongue injuries, no head injuries. No tenderness or weakness of extremities. She reports tonic clonic movements with typical seizures. No other seizure activity. She usually does not miss doses of AED's but admits that she has been out of her divalproex for about 4 days.    She was admitted to the hospital on  03/14/2019. Son had called our office two days prior with concerns of stroke and advised to take her to the ER. EMS was called to her home on 10/15 due to confusion, weakness, fever and cough. She was treated for CAP, Covid negative. She had significant deconditioning and was sent to rehab at [facility]. She was infected with COVID while in rehab. She never had any symptoms and recovered. She lives in a rental home with her son. She is very happy. She feels that memory is stable. She is able to dose her own medications and performs all ADL's independently. She does not drive.    HISTORY: (copied from my note on 07/12/2018)   Lachlyn Meer is a 71 y.o. female here today for follow up. She reports feeling well. She is taking lamotrigine 100mg  ER and divalproex 500mg  ER every night. She denies seizure activity. She is able to perform ADL's. She has not driven in [id number] years. She is currently living with her son who helps her with medications. She is feeling well today and without complaints.    HISTORY: (copied from [place] note on 10/31/2017)   Ms. Derenda Mis is a 71 year old female with a history of seizures.  She returns today for follow-up.  She is currently on Depakote taking 500 mg extended release as well as a Lamictal ER 100 mg daily.  She denies any seizure events.  She continues to live at [facility].  Reports that she is able to complete all ADLs independently.  She does not operate a motor vehicle.  Denies any changes in her gait or balance.  Denies any falls.  She returns today for evaluation.    Has been seen by neuro-psychologist and received a diagnosis of neurocognitive disorder, multiple etiologies. 2017: [place] CONSULTATION     Name:                          Madelein Hedgepeth Date of Birth:               Dec 10, 2050 [facility] MR#:                  [id number] Date of Evaluation:     02/11/16              Reason for Referral Annelisse Southerland is a 71 year-old, right-handed woman  with an established diagnosis of dementia who was referred for an assessment of her current cognitive functioning by Rexford Maus, MD of [facility].   Sources of Information Electronic medical records from the [facility] were reviewed. Ms. Merisier and her daughter-in-law, Ms. Kathe Becton, were interviewed.   Background According to Dr. Ulice Brilliant notes, Ms. 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 (Ms. Pogany reported that she was in a six week coma). She consequently has demonstrated persisting cognitive impairment that has precluded her from working. 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 She has a seizure disorder and takes anticonvulsant medication. Over the past year her family members have noticed deterioration in her cognitive and gait functioning, the latter resulting in multiple falls. A brain MRI scan on 04/15/15 revealed "right parietal cortical/juxtacortical, right anterior temporal gliosis and encephalomalacia, mild periventricular gliosis and moderate perisylvian and corpus callosum atrophy". Dr. Sunday Shams noted that her brain MRI results suggested an Alzheimer's type dementia. Her chart also listed diagnoses of Bipolar Type 1 disorder and obsessive compulsive disorder. Her current medications include desvenlafaxine, divalproex hydrocodone-acetaminophen as needed, lamotrigine and levothyroxine.    Interview She currently lives in an assisted living facility. She had initially moved from [place], [place] into an independent living apartment in [place] in November 2016 to be closer to her son. According to her daughter-in-law, she was falling frequently, skipping meals and not remembering to take her medications. In early 2017 she was moved into an assisted living facility and has since done  well. Specifically, she is no longer falling (even without using a walker) and is eating regularly. She continues to independently perform her personal care activities and dressing. She has not driven a car for at least ten years. One of her sons manages her financial affairs. Her daughter-in-law reported that to her knowledge Ms. Wedel has not exhibited aggressive, self-destructive, unsafe or bizarre behavior.  She has never heard her express suicidal or homicidal thoughts. She has not seemed to be having hallucinations nor has she expressed delusional ideas.    Ms. Caraway asked to be referred to by her nickname of "Boppi". She acknowledged having cognitive difficulties that have persisted since her TBI but could not provide specific examples other than difficulty making decisions. She reported that she struggles to make even simple decisions. It was unclear whether this is a new or old problem. She was aware that she lives in [place] but could not state the name or address of her current residence. When asked about her mood, she described herself as "content' and denied symptoms of mood disturbance. She also described herself as free of anxiety except for worry that she will not get her pet dog back from her family. She denied experiencing mood lability, mania, racing thoughts, suicidal or homicidal ideation. hallucinations and delusional ideas. When she and her daughter-in-law were queried about possible obsessive-compulsive behaviors, Ms. Keating reported that she likes her personal belongings to be in the same place and has certain utensils that she favors but did not reported feeling anxious when things were out of place or when using a different utensil. She denied experiencing obsessional thoughts. She reported her belief that use of Pristiq has been helpful as she reported that when she stopped taking that medication in the past she was prone to cry for no apparent reason. She volunteered that she was  diagnosed with Bipolar disorder sometime after her TBI but could not state what symptoms or behaviors this diagnoses was based on. She did recall having often felt "worthless" after her TBI. 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 She denied past or current use of illicit drugs or tobacco products.    She reported that she has been separated from her husband, who is a physician, for about five years. She described their relationship as cordial. She has three adult children. One of her sons, [name], has Power of Attorney status.  Prior to her TBI, she had earned a bachelor's degree in in Early Childhood Education.  She reported no history of cognitive or psychiatric problems prior to her TBI.   Observations She appeared as an appropriately groomed and dressed woman of her stated age. She appeared alert and in no apparent physical distress. She walked with a slightly unsteady and narrow based gait without assistive device. She interacted in a pleasant and cooperative manner. Her affect appeared within a wide range and was well-modulated. She did not display signs of emotional distress. Her communication was functional though she tended to ramble while speaking and had difficulty shifting conversational topic. Her thought processes were coherent though mildly disorganized. There were no signs of flight of ideas or frank confusion. Her thought content was devoid of unusual or bizarre ideas.   Evaluation Procedures In addition to a review of medical records and clinical interview, the following tests or questionnaires were administered:  Brief Neuropsychological Cognitive Examination Patient Health Questionnaire  Wechsler Adult Intelligence Scale-IV: Digit Span  Wechsler Memory Scale-IV: Logical Memory I & II; Symbol Span   Assessment Results Test results were deemed to represent a valid measure of her  current cognitive functioning.  She was cooperative with testing procedures. She did not exhibit signs of physical or emotional discomfort. She appeared to maintain adequate alertness and sustain attention to task. She did not report or display problems with vision or hearing.  She displayed a subtle hand tremor that did not seem to disrupt her drawing. She often required task instructions to be repeated due to apparent rapid forgetting. A few times she reverted to following instructions given for a task previously completed. In general her response speed was slow. Difficulty with visual-perceptual planning was suggested by her repeated inaccurate placement on a page of designs she copied despite having been given verbal directions coupled with her tendency to copy designs much larger than portrayed thereby being unable to fit them within the space allotted.  She was judged to have put forth optimal effort.    The Brief Neuropsychological Cognitive Examination (BNCE) offers a standardized screening of major cognitive functions typically affected by neurological and psychiatric conditions. Her BNCE total score of 19/30 fell within the moderate range of impairment. According to the test manual, persons who score within this range are usually unable to live independently. Her subtest scores are listed below:

## 2021-12-13 NOTE — Telephone Encounter (Signed)
[employer] Berkley Harvey: [phone] exp. 12/13/21-01/12/22 sent to GI

## 2021-12-13 NOTE — Patient Instructions (Signed)
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
## 2021-12-14 LAB — COMPREHENSIVE METABOLIC PANEL
ALT: 15 IU/L (ref 0–32)
AST: 23 IU/L (ref 0–40)
Albumin/Globulin Ratio: 1.6 (ref 1.2–2.2)
Albumin: 4.5 g/dL (ref 3.9–4.9)
Alkaline Phosphatase: 74 IU/L (ref 44–121)
BUN/Creatinine Ratio: 21 (ref 12–28)
BUN: 13 mg/dL (ref 8–27)
Bilirubin Total: 0.2 mg/dL (ref 0.0–1.2)
CO2: 20 mmol/L (ref 20–29)
Calcium: 10 mg/dL (ref 8.7–10.3)
Chloride: 100 mmol/L (ref 96–106)
Creatinine, Ser: 0.63 mg/dL (ref 0.57–1.00)
Globulin, Total: 2.8 g/dL (ref 1.5–4.5)
Glucose: 88 mg/dL (ref 70–99)
Potassium: 5 mmol/L (ref 3.5–5.2)
Sodium: 137 mmol/L (ref 134–144)
Total Protein: 7.3 g/dL (ref 6.0–8.5)
eGFR: 96 mL/min/{1.73_m2} (ref >59)

## 2021-12-14 LAB — VALPROIC ACID LEVEL: Valproic Acid Lvl: 42 ug/mL — ABNORMAL LOW (ref 50–100)

## 2021-12-15 ENCOUNTER — Telehealth: Payer: Self-pay | Admitting: Neurology

## 2021-12-15 ENCOUNTER — Other Ambulatory Visit: Payer: Self-pay | Admitting: *Deleted

## 2021-12-15 MED ORDER — VALPROIC ACID 250 MG PO CAPS: 250.0000 mg | ORAL_CAPSULE | Freq: Every day | ORAL | 0 refills | Status: DC

## 2021-12-15 MED ORDER — VALPROIC ACID 250 MG PO CAPS: 250.0000 mg | ORAL_CAPSULE | Freq: Four times a day (QID) | ORAL | 0 refills | Status: DC

## 2021-12-15 MED ORDER — LAMOTRIGINE ER 100 MG PO TB24: ORAL_TABLET | ORAL | 3 refills | Status: DC

## 2021-12-15 NOTE — Addendum Note (Signed)
Addended by: Melvyn Novas on: 12/15/2021 12:34 PM   Modules accepted: Orders

## 2021-12-15 NOTE — Telephone Encounter (Signed)
-----   Message from Melvyn Novas, MD sent at 12/15/2021 12:33 PM EDT ----- VPA ( valproic acid) has been for month lower than therapeutic range. I want to move to Lamictal as seizure and mood control medication , goal is to wean off depakote.  We will reduce the VPA to 250 mg only a day for the next 90 days. Prescription for 250 mg tabs was sent, and Lamictal dose will go to 200 mg a day, can take all at bedtime.

## 2021-12-15 NOTE — Telephone Encounter (Signed)
Called the patient's daughter in law on [place]. Reviewed the results with her. Advised that the pt will wean off depakote to 250 mg for 90 days and stop. She will add the lamotrigine 200 mg ER at bedtime now. Advised if there is any problems with this to let us know. She verbalized understanding.  Baird Lyons RN

## 2021-12-15 NOTE — Progress Notes (Signed)
VPA ( valproic acid) has been for month lower than therapeutic range. I want to move to Lamictal as seizure and mood control medication , goal is to wean off depakote.  We will reduce the VPA to 250 mg only a day for the next 90 days. Prescription for 250 mg tabs was sent, and Lamictal dose will go to 200 mg a day, can take all at bedtime.

## 2021-12-21 ENCOUNTER — Ambulatory Visit
Admission: RE | Admit: 2021-12-21 | Discharge: 2021-12-21 | Disposition: A | Discharge status: Home / Self Care | Payer: Medicare HMO | Source: Ambulatory Visit | Attending: Neurology | Admitting: Neurology

## 2021-12-21 DIAGNOSIS — R561 Post traumatic seizures: Secondary | ICD-10-CM | POA: Diagnosis not present

## 2021-12-21 MED ORDER — GADOBENATE DIMEGLUMINE 529 MG/ML IV SOLN
10.0000 mL | Freq: Once | INTRAVENOUS | Status: AC | PRN
  Administered 2021-12-21: 10 mL via INTRAVENOUS

## 2021-12-22 NOTE — Progress Notes (Signed)
Unchanged since 03-12-2021/ MRI scan of the brain with and without contrast showing the previous right parietal craniotomy with underlying area of cystic encephalomalacia and gliosis.  There are mild changes of age-related chronic small vessel disease and age advanced generalized cerebral and cerebellar atrophy.   No acute abnormalities are noted.  No abnormal areas of enhancement are noted.  Overall no significant change compared with CT head dated 03/12/2021.

## 2021-12-23 ENCOUNTER — Encounter: Payer: Self-pay | Admitting: Neurology

## 2021-12-23 ENCOUNTER — Ambulatory Visit: Payer: Medicare HMO | Admitting: Neurology

## 2021-12-23 DIAGNOSIS — Z79899 Other long term (current) drug therapy: Secondary | ICD-10-CM

## 2021-12-23 DIAGNOSIS — G3184 Mild cognitive impairment, so stated: Secondary | ICD-10-CM

## 2021-12-23 DIAGNOSIS — R561 Post traumatic seizures: Secondary | ICD-10-CM

## 2021-12-23 DIAGNOSIS — G9389 Other specified disorders of brain: Secondary | ICD-10-CM

## 2021-12-23 DIAGNOSIS — S069X3D Unspecified intracranial injury with loss of consciousness of 1 hour to 5 hours 59 minutes, subsequent encounter: Secondary | ICD-10-CM

## 2021-12-23 DIAGNOSIS — F1021 Alcohol dependence, in remission: Secondary | ICD-10-CM

## 2021-12-30 NOTE — Telephone Encounter (Signed)
Called the patient's daughter in law, Ms. Bowen, which is the number on file. No answer. LVM advising I had results and that I sent a my chart message and she can reply to that or return the call.

## 2022-01-01 DIAGNOSIS — Z79899 Other long term (current) drug therapy: Secondary | ICD-10-CM | POA: Insufficient documentation

## 2022-01-01 NOTE — Procedures (Signed)
[facility]  EEG (ELECTROENCEPHALOGRAM) REPORT     ORDERING CLINICIAN: Melvyn Novas, M.D.  TECHNOLOGIST: Marianne Sofia, REEGT TECHNIQUE:  This Electroencephalogram was recorded utilizing the international standard 10-20 system of lead placement and reformatted into average and bipolar montages.  A single ECG electrode is placed to detect heart rate and rhythm. While a camera is directed at the patient during EEG , and the patient is observed by the attending technologist, video and audio cannot be recorded.   STUDY DATE:  12-25-2021 PATIENT NAME: Shannon Barry Recoding duration : 24.11 Activation included:  Photic stimulation /Hyperventilation .      Description: The EEG's posterior dominant background rhythm of 7 to 8  hertz was symmetrically displayed while the patient's eyes were closed , and promptly attenuated with eye opening. At baseline, the recording showed a 8 amplitude in symmetric fashion with the exception of some attenuation (breech?)  at the right high parietal area.  Photic stimulation was initiated at frequencies from 3- through 21 hertz, resulting in no photic entrainment and remaining without periodic or rhythmic discharges, or epileptiform activity. Hyperventilation maneuver was initiated next,  leading to amplitude build-up, eyelid flatter and frequent coughing or throat clearing .  Following hyperventilation the patient's EEG was reviewed for a period of 2 minutes post maneuver, and indicated that the patient stayed awake - with minimal relaxation/ drowsiness.   The patient was recorded while awake, while drowsy.   IMPRESSION:  This EEG is normal.    Dr. Melvyn Novas, M.D. Accredited by the ABPN, ABSM.

## 2022-01-03 ENCOUNTER — Telehealth: Payer: Self-pay | Admitting: Neurology

## 2022-01-03 NOTE — Telephone Encounter (Signed)
Called the primary number on file, which appears to be the patient's daughter in law, Shannon Barry is on DPR form. LVM asking for a call back.

## 2022-01-03 NOTE — Telephone Encounter (Signed)
-----   Message from Melvyn Novas, MD sent at 01/01/2022  2:47 PM EDT ----- The patient was recorded while awake, while drowsy.   IMPRESSION:  This EEG is normal.

## 2022-01-05 NOTE — Telephone Encounter (Signed)
Will send a letter asking for the patient to contact us so we can review the results.

## 2022-01-24 NOTE — Progress Notes (Signed)
This encounter was created in error - please disregard.

## 2022-02-07 ENCOUNTER — Encounter: Payer: Self-pay | Admitting: Psychology

## 2022-02-07 ENCOUNTER — Ambulatory Visit: Payer: Medicare HMO | Admitting: Nurse Practitioner

## 2022-02-17 ENCOUNTER — Encounter: Payer: Self-pay | Admitting: Neurology

## 2022-02-17 ENCOUNTER — Ambulatory Visit: Payer: Medicare HMO | Admitting: Neurology

## 2022-02-17 VITALS — BP 122/68 | HR 72 | Ht 63.0 in | Wt 113.6 lb

## 2022-02-17 DIAGNOSIS — Z79899 Other long term (current) drug therapy: Secondary | ICD-10-CM | POA: Diagnosis not present

## 2022-02-17 DIAGNOSIS — R561 Post traumatic seizures: Secondary | ICD-10-CM

## 2022-02-17 DIAGNOSIS — G9389 Other specified disorders of brain: Secondary | ICD-10-CM | POA: Diagnosis not present
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
## 2022-02-17 NOTE — Patient Instructions (Addendum)
There are well-accepted and sensible ways to reduce risk for Alzheimers disease and other degenerative brain disorders .  Exercise Daily Walk A daily 20 minute walk should be part of your routine. Disease related apathy can be a significant roadblock to exercise and the only way to overcome this is to make it a daily routine and perhaps have a reward at the end (something your loved one loves to eat or drink perhaps) or a personal trainer coming to the home can also be very useful. Most importantly, the patient is much more likely to exercise if the caregiver / spouse does it with him/her. In general a structured, repetitive schedule is best.  General Health: Any diseases which effect your body will effect your brain such as a pneumonia, urinary infection, blood clot, heart attack or stroke. Keep contact with your primary care doctor for regular follow ups.  Sleep. A good nights sleep is healthy for the brain. Seven hours is recommended. If you have insomnia or poor sleep habits we can give you some instructions. If you have sleep apnea wear your mask.  Diet: Eating a heart healthy diet is also a good idea; fish and poultry instead of red meat, nuts (mostly non-peanuts), vegetables, fruits, olive oil or canola oil (instead of butter), minimal salt (use other spices to flavor foods), whole grain rice, bread, cereal and pasta and wine in moderation.Research is now showing that the MIND diet, which is a combination of The Mediterranean diet and the DASH diet, is beneficial for cognitive processing and longevity. Information about this diet can be found in The MIND Diet, a book by Maggie Moon, MS, RDN, and online at https://www.healthline.com/nutrition/mind-diet  Finances, Power of Attorney and Advance Directives: You should consider putting legal safeguards in place with regard to financial and medical decision making. While the spouse always has power of attorney for medical and financial issues in the  absence of any form, you should consider what you want in case the spouse / caregiver is no longer around or capable of making decisions.   The Alzheimers Association Position on Disease Prevention  Can Alzheimer's be prevented? It's a question that continues to intrigue researchers and fuel new investigations. There are no clear-cut answers yet -- partially due to the need for more large-scale studies in diverse populations -- but promising research is under way. The Alzheimer's Association is leading the worldwide effort to find a treatment for Alzheimer's, delay its onset and prevent it from developing.   What causes Alzheimer's? Experts agree that in the vast majority of cases, Alzheimer's, like other common chronic conditions, probably develops as a result of complex interactions among multiple factors, including age, genetics, environment, lifestyle and coexisting medical conditions. Although some risk factors -- such as age or genes -- cannot be changed, other risk factors -- such as high blood pressure and lack of exercise -- usually can be changed to help reduce risk. Research in these areas may lead to new ways to detect those at highest risk.  Prevention studies A small percentage of people with Alzheimer's disease (less than 1 percent) have an early-onset type associated with genetic mutations. Individuals who have these genetic mutations are guaranteed to develop the disease. An ongoing clinical trial conducted by the [facility] ([facility]), is testing whether antibodies to beta-amyloid can reduce the accumulation of beta-amyloid plaque in the brains of people with such genetic mutations and thereby reduce, delay or prevent symptoms. Participants in the trial are receiving antibodies (  or placebo) before they develop symptoms, and the development of beta-amyloid plaques is being monitored by brain scans and other tests.  Another clinical trial, known as the A4 trial  (Anti-Amyloid Treatment in Asymptomatic Alzheimer's), is testing whether antibodies to beta-amyloid can reduce the risk of Alzheimer's disease in older people (ages 65 to 85) at high risk for the disease. The A4 trial is being conducted by the Alzheimer's Disease Co[facility] Study.  Though research is still evolving, evidence is strong that people can reduce their risk by making key lifestyle changes, including participating in regular activity and maintaining good heart health. Based on this research, the Alzheimer's Association offers 10 Ways to Love Your Brain -- a collection of tips that can reduce the risk of cognitive decline.  Heart-head connection  New research shows there are things we can do to reduce the risk of mild cognitive impairment and dementia.  Several conditions known to increase the risk of cardiovascular disease -- such as high blood pressure, diabetes and high cholesterol -- also increase the risk of developing Alzheimer's. Some autopsy studies show that as many as 80 percent of individuals with Alzheimer's disease also have cardiovascular disease.  A longstanding question is why some people develop hallmark Alzheimer's plaques and tangles but do not develop the symptoms of Alzheimer's. Vascular disease may help researchers eventually find an answer. Some autopsy studies suggest that plaques and tangles may be present in the brain without causing symptoms of cognitive decline unless the brain also shows evidence of vascular disease. More research is needed to better understand the link between vascular health and Alzheimer's.  Physical exercise and diet Regular physical exercise may be a beneficial strategy to lower the risk of Alzheimer's and vascular dementia. Exercise may directly benefit brain cells by increasing blood and oxygen flow in the brain. Because of its known cardiovascular benefits, a medically approved exercise program is a valuable part of any overall wellness  plan.  Current evidence suggests that heart-healthy eating may also help protect the brain. Heart-healthy eating includes limiting the intake of sugar and saturated fats and making sure to eat plenty of fruits, vegetables, and whole grains. No one diet is best. Two diets that have been studied and may be beneficial are the DASH (Dietary Approaches to Stop Hypertension) diet and the Mediterranean diet. The DASH diet emphasizes vegetables, fruits and fat-free or low-fat dairy products; includes whole grains, fish, poultry, beans, seeds, nuts and vegetable oils; and limits sodium, sweets, sugary beverages and red meats. A Mediterranean diet includes relatively little red meat and emphasizes whole grains, fruits and vegetables, fish and shellfish, and nuts, olive oil and other healthy fats.  Social connections and intellectual activity A number of studies indicate that maintaining strong social connections and keeping mentally active as we age might lower the risk of cognitive decline and Alzheimer's. Experts are not certain about the reason for this association. It may be due to direct mechanisms through which social and mental stimulation strengthen connections between nerve cells in the brain.  Head trauma There appears to be a strong link between future risk of Alzheimer's and serious head trauma, especially when injury involves loss of consciousness. You can help reduce your risk of Alzheimer's by protecting your head.  Wear a seat belt  Use a helmet when participating in sports  "Fall-proof" your home   What you can do now While research is not yet conclusive, certain lifestyle choices, such as physical activity and diet, may help support brain   health and prevent Alzheimer's. Many of these lifestyle changes have been shown to lower the risk of other diseases, like heart disease and diabetes, which have been linked to Alzheimer's. With few drawbacks and plenty of known benefits, healthy lifestyle  choices can improve your health and possibly protect your brain.  Learn more about brain health. You can help increase our knowledge by considering participation in a clinical study. Our free clinical trial matching services, TrialMatch, can help you find clinical trials in your area that are seeking volunteers.  Understanding prevention research Here are some things to keep in mind about the research underlying much of our current knowledge about possible prevention:  Insights about potentially modifiable risk factors apply to large population groups, not to individuals. Studies can show that factor X is associated with outcome Y, but cannot guarantee that any specific person will have that outcome. As a result, you can "do everything right" and still have a serious health problem or "do everything wrong" and live to be 100.  Much of our current evidence comes from large epidemiological studies such as the [place] Aging Study, the Nurses' Health Study, the Adult Changes in Thought Study and the [employer] These studies explore pre-existing behaviors and use statistical methods to relate those behaviors to health outcomes. This type of study can show an "association" between a factor and an outcome but cannot "prove" cause and effect. This is why we describe evidence based on these studies with such language as "suggests," "may show," "might protect," and "is associated with."  The gold standard for showing cause and effect is a clinical trial in which participants are randomly assigned to a prevention or risk management strategy or a control group. Researchers follow the two groups over time to see if their outcomes differ significantly.  It is unlikely that some prevention or risk management strategies will ever be tested in randomized trials for ethical or practical reasons. One example is exercise. Definitively testing the impact of exercise on Alzheimer's risk would require a huge  trial enrolling thousands of people and following them for many years. The expense and logistics of such a trial would be prohibitive, and it would require some people to go without exercise, a known health benefit.     Posttraumatic seizure,   Medication management:  We are weaning off Depakote and building up Lamictal to 100 mg bid- prescription was placed 12-15-2021 but I cant be sure when it was picked up. I will draw both blood level and liver function tests.  She can go to Lamictal 100 mg bid now

## 2022-02-18 ENCOUNTER — Ambulatory Visit: Payer: Self-pay

## 2022-02-18 LAB — COMPREHENSIVE METABOLIC PANEL
ALT: 12 IU/L (ref 0–32)
AST: 22 IU/L (ref 0–40)
Albumin/Globulin Ratio: 1.8 (ref 1.2–2.2)
Albumin: 4.4 g/dL (ref 3.9–4.9)
Alkaline Phosphatase: 66 IU/L (ref 44–121)
BUN/Creatinine Ratio: 13 (ref 12–28)
BUN: 9 mg/dL (ref 8–27)
Bilirubin Total: 0.2 mg/dL (ref 0.0–1.2)
CO2: 24 mmol/L (ref 20–29)
Calcium: 9.7 mg/dL (ref 8.7–10.3)
Chloride: 98 mmol/L (ref 96–106)
Creatinine, Ser: 0.71 mg/dL (ref 0.57–1.00)
Globulin, Total: 2.4 g/dL (ref 1.5–4.5)
Glucose: 98 mg/dL (ref 70–99)
Potassium: 5.2 mmol/L (ref 3.5–5.2)
Sodium: 139 mmol/L (ref 134–144)
Total Protein: 6.8 g/dL (ref 6.0–8.5)
eGFR: 92 mL/min/{1.73_m2} (ref >59)

## 2022-02-18 LAB — LAMOTRIGINE LEVEL: Lamotrigine Lvl: 5.4 ug/mL (ref 2.0–20.0)

## 2022-02-18 LAB — VALPROIC ACID LEVEL: Valproic Acid Lvl: 39 ug/mL — ABNORMAL LOW (ref 50–100)

## 2022-02-18 NOTE — Patient Outreach (Signed)
  Care Coordination   02/18/2022 Name: Shannon Barry MRN: [id number] DOB: 1951/05/14   Care Coordination Outreach Attempts:  An unsuccessful telephone outreach was attempted for a scheduled appointment today.  Follow Up Plan:  Additional outreach attempts will be made to offer the patient care coordination information and services.   Encounter Outcome:  No Answer  Care Coordination Interventions Activated:  No   Care Coordination Interventions:  No, not indicated    Barb Merino, RN, BSN, CCM Care Management Coordinator [facility]  Direct Phone: [phone]

## 2022-02-19 ENCOUNTER — Encounter: Payer: Self-pay | Admitting: Nurse Practitioner

## 2022-02-19 NOTE — Progress Notes (Signed)
As expected VPA ( Depakote) level lower as we are weaning off this drug in order to increase Lamictal ( Lamotrigine) further.  Lamotrigine level after 6 weeks is still too low, and was increased from 100 MG a day to 200 mg a day.

## 2022-02-22 ENCOUNTER — Telehealth: Payer: Self-pay | Admitting: *Deleted

## 2022-02-22 NOTE — Telephone Encounter (Signed)
LVM for daughter in law, Linzie Collin to call about results.

## 2022-02-22 NOTE — Telephone Encounter (Signed)
Called and spoke w/ Joellen Jersey. Relayed results. Advised she should take depakote 250mg  daily and stop around 03/17/22, as long as she has been on this lower dose since 12/15/21. She only wanted her to take this lower dose for 90 days then stop.  She should take lamotrigine 100mg  ER, 2 caps po qhs (total of 43mmg)  She verbalized understanding and will confirm this is how she is taking her meds.

## 2022-02-22 NOTE — Telephone Encounter (Signed)
-----   Message from Larey Seat, MD sent at 02/19/2022  4:19 PM EDT ----- As expected VPA ( Depakote) level lower as we are weaning off this drug in order to increase Lamictal ( Lamotrigine) further.  Lamotrigine level after 6 weeks is still too low, and was increased from 100 MG a day to 200 mg a day.

## 2022-03-07 ENCOUNTER — Ambulatory Visit: Payer: Self-pay

## 2022-03-07 NOTE — Patient Outreach (Signed)
  Care Coordination   03/07/2022 Name: Shannon Barry MRN: [id number] DOB: [birth year]   Care Coordination Outreach Attempts:  An unsuccessful telephone outreach was attempted for a scheduled appointment today.  Follow Up Plan:  No further outreach attempts will be made at this time. We have been unable to contact the patient to offer or enroll patient in care coordination services  Encounter Outcome:  No Answer  Care Coordination Interventions Activated:  No   Care Coordination Interventions:  No, not indicated    Barb Merino, RN, BSN, CCM Care Management Coordinator [facility]  Direct Phone: [phone]

## 2022-03-12 ENCOUNTER — Other Ambulatory Visit: Payer: Self-pay | Admitting: Neurology

## 2022-06-27 ENCOUNTER — Telehealth: Payer: Self-pay | Admitting: Neurology

## 2022-06-27 NOTE — Telephone Encounter (Signed)
Pt daughter in law is calling. Stated pt had a really bad seizure Saturday morning at 3 am. Stated pt fell down the stairs and the seizure lasted 10 minutes. Stated pt is weak and can't eat. Stated pt is having dizzy spells. She is requesting a call from nurse.

## 2022-06-27 NOTE — Telephone Encounter (Signed)
Called the patient's daughter and she states that leading up to the seizure within 10 days she may had missed 1-2 doses of her seizure medications due to getting out of routine. The daughter in law, states that the patient fell while having the seizure and it was not witnessed. She was found laying face up on her back with feet towards floor as if she may had sat down on stairs and slid but they are unsure whether she hit her head for certain. Since the seizure she feels more emotional then normal, more confused than normal. She is light headed and dizzy. She intermittently has dizzy spells that come and go. She is unable to eat, the thought of food makes her nauseous. She is drinking plenty of eater and tea and keeping it down. Has not had a vomiting episode. She states when she tried to read small print or tries to text it cause her to become dizzy.  I advised I would discuss with Dr Brett Fairy to ask whether she thinks a CT head should be ordered.  Also will look to see if the patient can be worked in for an evaluation.

## 2022-06-28 NOTE — Telephone Encounter (Signed)
Called the daughter in law and was able to get the patient worked in to be seen Wednesday. She stated that last night was able to eat a sandwich and keep it down and the light head feeling was some better. They will be here tomorrow for the appt

## 2022-06-29 ENCOUNTER — Encounter: Payer: Self-pay | Admitting: Neurology

## 2022-06-29 ENCOUNTER — Ambulatory Visit: Payer: Medicare HMO | Admitting: Neurology

## 2022-06-29 VITALS — BP 139/73 | HR 80 | Ht 63.0 in | Wt 114.0 lb

## 2022-06-29 DIAGNOSIS — G9389 Other specified disorders of brain: Secondary | ICD-10-CM

## 2022-06-29 DIAGNOSIS — G3184 Mild cognitive impairment, so stated: Secondary | ICD-10-CM | POA: Diagnosis not present

## 2022-06-29 DIAGNOSIS — R561 Post traumatic seizures: Secondary | ICD-10-CM | POA: Diagnosis not present

## 2022-06-29 DIAGNOSIS — S069X3D Unspecified intracranial injury with loss of consciousness of 1 hour to 5 hours 59 minutes, subsequent encounter: Secondary | ICD-10-CM

## 2022-06-29 MED ORDER — LAMOTRIGINE ER 100 MG PO TB24
ORAL_TABLET | ORAL | 3 refills | Status: DC
Start: 2022-06-29 — End: 2022-08-18

## 2022-06-29 MED ORDER — DONEPEZIL HCL 5 MG PO TABS: 5.0000 mg | ORAL_TABLET | Freq: Every day | ORAL | 5 refills | Status: DC

## 2022-06-29 NOTE — Patient Instructions (Addendum)
Donepezil Tablets What is this medication? DONEPEZIL (doe NEP e zil) treats memory loss and confusion (dementia) in people who have Alzheimer disease. It works by improving attention, memory, and the ability to engage in daily activities. It is not a cure for dementia or Alzheimer disease. This medicine may be used for other purposes; ask your health care provider or pharmacist if you have questions. COMMON BRAND NAME(S): Aricept What should I tell my care team before I take this medication? They need to know if you have any of these conditions: Asthma or other lung disease Difficulty passing urine Heart disease or History of irregular heartbeat Liver disease Stomach or intestinal disease, ulcers or stomach bleeding An unusual or allergic reaction to donepezil, other medications, foods, dyes, or preservatives Pregnant or trying to get pregnant Breast-feeding How should I use this medication? Take this medication by mouth with a glass of water. Follow the directions on the prescription label. You may take this medication with or without food. Take this medication at regular intervals. This medication is usually taken before bedtime. Do not take it more often than directed. Continue to take your medication even if you feel better. Do not stop taking except on your care team's advice. If you are taking the 23 mg donepezil tablet, swallow it whole; do not cut, crush, or chew it. Talk to your care team about the use of this medication in children. Special care may be needed. Overdosage: If you think you have taken too much of this medicine contact a poison control center or emergency room at once. NOTE: This medicine is only for you. Do not share this medicine with others. What if I miss a dose? If you miss a dose, take it as soon as you can. If it is almost time for your next dose, take only that dose, do not take double or extra doses. What may interact with this medication? Do not take this  medication with any of the following: Certain medications for fungal infections like itraconazole, fluconazole, posaconazole, and voriconazole Cisapride Dextromethorphan; quinidine Dronedarone Pimozide Quinidine Thioridazine This medication may also interact with the following: Antihistamines for allergy, cough and cold Atropine Bethanechol Carbamazepine Certain medications for bladder problems like oxybutynin, tolterodine Certain medications for Parkinson's disease like benztropine, trihexyphenidyl Certain medications for stomach problems like dicyclomine, hyoscyamine Certain medications for travel sickness like scopolamine Dexamethasone Dofetilide Ipratropium NSAIDs, medications for pain and inflammation, like ibuprofen or naproxen Other medications for Alzheimer's disease Other medications that prolong the QT interval (cause an abnormal heart rhythm) Phenobarbital Phenytoin Rifampin, rifabutin or rifapentine Ziprasidone This list may not describe all possible interactions. Give your health care provider a list of all the medicines, herbs, non-prescription drugs, or dietary supplements you use. Also tell them if you smoke, drink alcohol, or use illegal drugs. Some items may interact with your medicine. What should I watch for while using this medication? Visit your care team for regular checks on your progress. Check with your care team if your symptoms do not get better or if they get worse. You may get drowsy or dizzy. Do not drive, use machinery, or do anything that needs mental alertness until you know how this medication affects you. What side effects may I notice from receiving this medication? Side effects that you should report to your care team as soon as possible: Allergic reactions--skin rash, itching, hives, swelling of the face, lips, tongue, or throat Peptic ulcer--burning stomach pain, loss of appetite, bloating, burping, heartburn, nausea, vomiting Seizures  Slow  heartbeat--dizziness, feeling faint or lightheaded, confusion, trouble breathing, unusual weakness or fatigue Stomach bleeding--bloody or black, tar-like stools, vomiting blood or brown material that looks like coffee grounds Trouble passing urine Side effects that usually do not require medical attention (report these to your care team if they continue or are bothersome): Diarrhea Fatigue Loss of appetite Muscle pain or cramps Nausea Trouble sleeping This list may not describe all possible side effects. Call your doctor for medical advice about side effects. You may report side effects to FDA at 1-800-FDA-1088. Where should I keep my medication? Keep out of reach of children. Store at room temperature between 15 and 30 degrees C (59 and 86 degrees F). Throw away any unused medication after the expiration date. NOTE: This sheet is a summary. It may not cover all possible information. If you have questions about this medicine, talk to your doctor, pharmacist, or health care provider.  2023 Elsevier/Gold Standard (2020-12-03 00:00:00) Epilepsy Epilepsy is when a person keeps having seizures. A seizure is a burst of abnormal activity in the brain. This condition can cause problems such as: A change in how you think or behave. Trouble knowing what is happening. Falls, accidents, and injury. Sadness (depression). Poor memory. In rare cases, this condition can be life-threatening. But most people with epilepsy lead normal lives. What are the causes? A head injury or an injury that happens at birth. A high fever during childhood. A stroke. Bleeding into or around the brain. Some medicines and drugs. Having too little oxygen for a long time. Abnormal brain development. Conditions such as: Brain infection. Brain tumors. Conditions that are passed from parent to child. Many times, the cause is not known. What are the signs or symptoms? Symptoms of a seizure vary from person to person.  They may include: Symptoms during a seizure Shaking with fast, jerky movements of muscles (convulsions). Stiffness of the body. Breathing problems. Being mixed up (confused). Staring or being hard to wake up (being unresponsive). Head nodding, eye blinking, eye twitching, or fast eye movements. Drooling, grunting, or making clicking sounds with your mouth. Not being able to control when you pee or poop. Symptoms before a seizure Feeling afraid, worried, or nervous. Feeling like you may vomit. Vertigo. This feels like: You are moving when you are not. Things around you are moving when they are not. Dj vu. This is a feeling of having seen or heard something before. Odd tastes or smells. Changes in how you see, such as seeing flashing lights or spots. Symptoms after a seizure Being confused. Being sleepy. A headache. Sore muscles. How is this treated? Treatment can control seizures. It may include: Taking medicines. Having a device put in the chest (vagus nerve stimulator). Brain surgery. Having blood tests often. Eating foods that are low in carbohydrates and high in fat (ketogenic diet). If you are diagnosed with this condition, you should start treatment as soon as you can. Follow these instructions at home: Medicines Take over-the-counter and prescription medicines only as told by your doctor. Avoid anything that may keep your medicine from working, such as alcohol. Activity Get enough rest. Follow your doctor's advice about driving, swimming, and doing other things that would be dangerous if you had a seizure. If you live in the [place] ask your local department of motor vehicles about local driving laws for people with epilepsy. Teaching others  Teach friends and family what to do if you have a seizure. Tell them to: Help you get down  to the ground. Put a pillow under your head and body. Loosen any clothing around your neck. Turn you on your side. Stay with you until  you are better. Know whether or not you need emergency care. Also, tell them what not to do if you have a seizure. Tell them: They should not hold you down. They should not put anything in your mouth. General instructions Avoid things that cause you to have seizures. Keep a seizure diary. Write down: What you remember about each seizure. What might have caused the seizure. Keep all follow-up visits. Where to find more information Epilepsy Foundation: epilepsy.com International [employer] Against Epilepsy: ilae.org Contact a doctor if: You have a change in how often or when you have seizures. You get an infection or start to feel sick. You are not able to take your medicine. Get help right away if: A seizure does not stop after 5 minutes. You have more than one seizure in a row, and you do not have enough time between the seizures to feel better. A seizure makes it harder to breathe. A seizure is different from other seizures you have had. A seizure makes you unable to speak or use a part of your body. You did not wake up right away after a seizure. You feel sad, and this does not get better. These symptoms may be an emergency. Get help right away. Call your local emergency services (911 in the [place] Do not wait to see if the symptoms will go away. Do not drive yourself to the hospital. Get help right awayif you feel like you may hurt yourself or others, or have thoughts about taking your own life. Go to your nearest emergency room or: Call your local emergency services (911 in the [place] Call the [place] at [phone] or 988 in the [place] This is open 24 hours a day. Text the Crisis Text Line at 772-298-3290. Summary Epilepsy is when a person keeps having seizures. Seizures can cause many symptoms, such as brief staring and shaking or jerky muscle movements. Treatment can control seizures. Take over-the-counter and prescription medicines only as told by your  doctor. Follow your doctor's advice about driving, swimming, and doing other things that would be dangerous if you had a seizure. Teach friends and family what to do if you have a seizure. This information is not intended to replace advice given to you by your health care provider. Make sure you discuss any questions you have with your health care provider. Document Revised: 12/09/2020 Document Reviewed: 11/18/2019 Elsevier Patient Education  [place].
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
## 2022-06-30 ENCOUNTER — Encounter: Payer: Self-pay | Admitting: Neurology

## 2022-06-30 LAB — CBC WITH DIFFERENTIAL/PLATELET
Basophils Absolute: 0 10*3/uL (ref 0.0–0.2)
Basos: 1 %
EOS (ABSOLUTE): 0 10*3/uL (ref 0.0–0.4)
Eos: 1 %
Hematocrit: 41.8 % (ref 34.0–46.6)
Hemoglobin: 14.8 g/dL (ref 11.1–15.9)
Immature Grans (Abs): 0 10*3/uL (ref 0.0–0.1)
Immature Granulocytes: 0 %
Lymphocytes Absolute: 1.2 10*3/uL (ref 0.7–3.1)
Lymphs: 30 %
MCH: 33 pg (ref 26.6–33.0)
MCHC: 35.4 g/dL (ref 31.5–35.7)
MCV: 93 fL (ref 79–97)
Monocytes Absolute: 0.5 10*3/uL (ref 0.1–0.9)
Monocytes: 11 %
Neutrophils Absolute: 2.4 10*3/uL (ref 1.4–7.0)
Neutrophils: 57 %
Platelets: 242 10*3/uL (ref 150–450)
RBC: 4.48 x10E6/uL (ref 3.77–5.28)
RDW: 12.1 % (ref 11.7–15.4)
WBC: 4.1 10*3/uL (ref 3.4–10.8)

## 2022-06-30 LAB — COMPREHENSIVE METABOLIC PANEL
ALT: 14 IU/L (ref 0–32)
AST: 22 IU/L (ref 0–40)
Albumin/Globulin Ratio: 1.8 (ref 1.2–2.2)
Albumin: 4.3 g/dL (ref 3.9–4.9)
Alkaline Phosphatase: 60 IU/L (ref 44–121)
BUN/Creatinine Ratio: 21 (ref 12–28)
BUN: 15 mg/dL (ref 8–27)
Bilirubin Total: 0.4 mg/dL (ref 0.0–1.2)
CO2: 23 mmol/L (ref 20–29)
Calcium: 9.6 mg/dL (ref 8.7–10.3)
Chloride: 107 mmol/L — ABNORMAL HIGH (ref 96–106)
Creatinine, Ser: 0.71 mg/dL (ref 0.57–1.00)
Globulin, Total: 2.4 g/dL (ref 1.5–4.5)
Glucose: 90 mg/dL (ref 70–99)
Potassium: 4.4 mmol/L (ref 3.5–5.2)
Sodium: 133 mmol/L — ABNORMAL LOW (ref 134–144)
Total Protein: 6.7 g/dL (ref 6.0–8.5)
eGFR: 91 mL/min/{1.73_m2} (ref >59)

## 2022-06-30 LAB — LAMOTRIGINE LEVEL: Lamotrigine Lvl: 15.9 ug/mL (ref 2.0–20.0)

## 2022-06-30 NOTE — Progress Notes (Signed)
Very mild hyponatremia, not likely cause for seizure breakthrough.  No Anemia. Lamictal level at date of our visit was 15.9- keep it there !

## 2022-07-04 ENCOUNTER — Telehealth: Payer: Self-pay | Admitting: Neurology

## 2022-07-04 NOTE — Telephone Encounter (Signed)
Called the number on file which is the daughter in law (listed on DPR, Joellen Jersey) there was no answer. LVM advising of the lab results and informed them to call back if there are any questions.

## 2022-07-04 NOTE — Telephone Encounter (Signed)
-----   Message from Larey Seat, MD sent at 06/30/2022  4:51 PM EST ----- Very mild hyponatremia, not likely cause for seizure breakthrough.  No Anemia. Lamictal level at date of our visit was 15.9- keep it there !

## 2022-07-25 ENCOUNTER — Other Ambulatory Visit: Payer: Self-pay | Admitting: Neurology

## 2022-07-26 ENCOUNTER — Ambulatory Visit: Payer: Medicare HMO | Admitting: Family Medicine

## 2022-08-18 ENCOUNTER — Ambulatory Visit: Payer: Medicare HMO | Admitting: Neurology

## 2022-08-18 ENCOUNTER — Encounter: Payer: Self-pay | Admitting: Neurology

## 2022-08-18 DIAGNOSIS — E039 Hypothyroidism, unspecified: Secondary | ICD-10-CM

## 2022-08-18 MED ORDER — LAMOTRIGINE ER 100 MG PO TB24: ORAL_TABLET | ORAL | 3 refills | Status: DC

## 2022-08-18 MED ORDER — DONEPEZIL HCL 5 MG PO TABS: 5.0000 mg | ORAL_TABLET | Freq: Two times a day (BID) | ORAL | 5 refills | Status: DC | PRN

## 2022-08-18 MED ORDER — LEVOTHYROXINE SODIUM 88 MCG PO TABS: 88.0000 ug | ORAL_TABLET | Freq: Every day | ORAL | 0 refills | Status: DC

## 2022-08-18 NOTE — Progress Notes (Addendum)
Provider:  Larey Seat, MD  Primary Care Physician:  [name], [place] [address]     Referring Provider: [name], [place],  [place] [address]          Chief Complaint according to patient   Patient presents with:     Pt with daughter in law, rm 2. Here for follow up for seizure follow up. Since being here last time she has had a sz. The daughter in law is wandering whether turning on the lights could be the trigger since most of them have occurred either overnight or first thing in the am. She is scheduled next week to complete neuro psych testing.           HISTORY OF PRESENT ILLNESS:  Shannon Barry is a 72 y.o. female patient who is here for revisit 08/18/2022 for seizure medication follow up. .  Chief concern according to patient :  not sure I had more spells.    08-18-2022: Shannon Barry is seen here today in the presence of her daughter -in -law. She had another seizure at 10 AM in the morning, she had just woken up and was on a bathroom break.  The patient described a short spell, and her granddaughter witnessed her spell. The patient feels this may not be a seizure at all. She was able to direct her gaze at the person, responding to her calling her name. Overall, there is a feeling that seizures happen when she switches the bathroom lights on, not with micturating, not with arousal - it is in the morning on the way to the bathroom or back to bed.  Lamictal was changed to 24 h tabs but she takes 2 a day. Off Depakote, less trembling, no weight gain.  She has severe rosacea.   She has no side effects from her medications and is expected to see neuropsychology next week. Aricept was increased to 5 mg bid as she has GI symptoms on 10 mg daily dosing.    06-29-2022: Rv with her son , after having had a severe seizure Friday last, 06-24-2022.which happened on top of the staircase , and she fell  down, possibly suffering a concussion. This was at 2-3 AM, and she is not sure if she took her night time Lamictal.  Seizure witnessed by son. Prolonged post-ictum. In her words; I went to sleep and for days I felt spaced out".    She attributes poor sleep to a higher risk of seizures. No visible injuries. She was still taking medications of lamictal and depakote, until a Rv would have released her to monotherapy on lamictal.    02-17-2022: I had just the pleasure of seeing Shannon Barry on 7-17 2023 and in the meantime as her daughter-in-law reports that she had at least 2 seizures.(!)   An MRI of the brain has been ordered for posttraumatic seizures that shows changes of previous right parietal craniotomy well-known cystic encephalomalacia and gliosis.  There is some for age advanced general cerebellar atrophy and cerebral atrophy but no acute abnormalities were noted the study was compared with a CT dated 03-12-2021.  All vessels appear flow patent.  The study was read by Dr. Royal Hawthorn.   The EEG showed a slowed background rhythm between 7 and 8 Hz symmetrically -recording showed an  breach artifact localized  at the high right parietal area . The  photic stimulation resulted in no photic entrainment the patient stayed awake for the whole recording.   This EEG was considered normal except for the breach artifact which is a scalp defect postoperatively. She had burr holes on both parietal areas  to relief blood. She has had a right sided compressed skull fracture.     Medication management: The patient did not bring her medication with her to this visit.  The patient reports she has taken lower doses of Depakote and has not yet started the increase in Lamictal.  We are weaning off Depakote and building up Lamictal to 100 mg bid . I will draw both blood level and liver function tests. Can go to Lamictal 100 mg bid.          CD : 12-15-2021 I have the pleasure of seeing Shannon Barry today and meanwhile  73 year old Caucasian patient with a history of traumatic brain injury, induced coma, cognitive deficits, history of alcohol abuse in the past, and seizure disorder.  She had frequent falls when I initially encountered her in the year 2000 1617 and 19 but her seizures were well controlled.  Now she presents with a uptake in seizure frequency and also reports memory blips short parts of conversations that are essential to the message and not recalled and usually this conversation may have taken place within hours or days before.  Her long-term memory is described as intact.  She recovered very well from her cognitive baseline when I initially encountered her.  Her neuropsychologist had predicted that she would need to live with assistance but she recovered her ability to do activities of daily living   Review of Systems: Out of a complete 14 system review, the patient complains of only the following symptoms, and all other reviewed systems are negative.:  Fatigue, sleepiness , snoring, fragmented sleep, Insomnia, 9 hours of nocturnal sleep, between 1 AM and 9.30 AM    How likely are you to doze in the following situations: 0 = not likely, 1 = slight chance, 2 = moderate chance, 3 = high chance   Sitting and Reading? Watching Television? Sitting inactive in a public place (theater or meeting)? As a passenger in a car for an hour without a break? Lying down in the afternoon when circumstances permit? Sitting and talking to someone? Sitting quietly after lunch without alcohol? In a car, while stopped for a few minutes in traffic? Not obtained today.    Social History   Socioeconomic History   Marital status: Divorced    Spouse name: Not on file   Number of children: Not on file   Years of education: Not on file   Highest education level: Not on file  Occupational History   Not on file  Tobacco Use   Smoking status: Never   Smokeless tobacco: Never  Vaping Use   Vaping Use: Never used   Substance and Sexual Activity   Alcohol use: No    Alcohol/week: 0.0 standard drinks of alcohol   Drug use: No   Sexual activity: Yes  Other Topics Concern   Not on file  Social History Narrative   Huge [employer] fan.   She loves college football.    She has a dog named buddy.    Social Determinants of Health   Financial Resource Strain: Low Risk  (10/05/2021)   Overall Financial Resource Strain (CARDIA)    Difficulty of Paying Living Expenses: Not hard at all  Food Insecurity: No Food Insecurity (10/05/2021)  Hunger Vital Sign    Worried About Running Out of Food in the Last Year: Never true    Ran Out of Food in the Last Year: Never true  Transportation Needs: No Transportation Needs (10/05/2021)   PRAPARE - Hydrologist (Medical): No    Lack of Transportation (Non-Medical): No  Recent Concern: Transportation Needs - Unmet Transportation Needs (08/04/2021)   PRAPARE - Hydrologist (Medical): Yes    Lack of Transportation (Non-Medical): No  Physical Activity: Sufficiently Active (10/05/2021)   Exercise Vital Sign    Days of Exercise per Week: 7 days    Minutes of Exercise per Session: 80 min  Stress: No Stress Concern Present (10/05/2021)   [place]    Feeling of Stress : Not at all  Social Connections: Moderately Integrated (10/05/2021)   Social Connection and Isolation Panel [NHANES]    Frequency of Communication with Friends and Family: More than three times a week    Frequency of Social Gatherings with Friends and Family: Not on file    Attends Religious Services: 1 to 4 times per year    Active Member of [employer] or Organizations: Yes    Attends Music therapist: More than 4 times per year    Marital Status: Divorced    No family history on file.  Past Medical History:  Diagnosis Date   Allergy    Depression    Seizures ([place])    Thyroid  disease    Traumatic brain injury [facility])     Past Surgical History:  Procedure Laterality Date   right hand surgey  Right    shoulder injury  Left    traumatic head injury  N/A    URETERAL REIMPLANTATION OF TRANSPLANTED KIDNEY N/A      Current Outpatient Medications on File Prior to Visit  Medication Sig Dispense Refill   cetirizine (ZYRTEC ALLERGY) 10 MG tablet Take 1 tablet (10 mg total) by mouth daily. 90 tablet 1   desvenlafaxine (PRISTIQ) 50 MG 24 hr tablet Take 1 tablet (50 mg total) by mouth daily. 90 tablet 1   donepezil (ARICEPT) 5 MG tablet Take 1 tablet (5 mg total) by mouth at bedtime. 30 tablet 5   lamoTRIgine (LAMICTAL XR) 100 MG 24 hour tablet TAKE 1 TABLET BY MOUTH in AM and at BEDTIME 180 tablet 3   levothyroxine (SYNTHROID) 88 MCG tablet Take 1 tablet (88 mcg total) by mouth daily before breakfast. (Patient taking differently: Take 88 mcg by mouth daily before breakfast. Take 2 tablets on Sundays) 90 tablet 1   No current facility-administered medications on file prior to visit.    Allergies  Allergen Reactions   Penicillins Hives    Has patient had a PCN reaction causing immediate rash, facial/tongue/throat swelling, SOB or lightheadedness with hypotension: No Has patient had a PCN reaction causing severe rash involving mucus membranes or skin necrosis: Yes Has patient had a PCN reaction that required hospitalization No Has patient had a PCN reaction occurring within the last 10 years: Yes If all of the above answers are "NO", then may proceed with Cephalosporin use.    Sulfa Antibiotics Other (See Comments)    violently ill     DIAGNOSTIC DATA (LABS, IMAGING, TESTING) - I reviewed patient records, labs, notes, testing and imaging myself where available.  Lab Results  Component Value Date   WBC 4.1 06/29/2022  HGB 14.8 06/29/2022   HCT 41.8 06/29/2022   MCV 93 06/29/2022   PLT 242 06/29/2022      Component Value Date/Time   NA 133 (L) 06/29/2022  1152   K 4.4 06/29/2022 1152   CL 107 (H) 06/29/2022 1152   CO2 23 06/29/2022 1152   GLUCOSE 90 06/29/2022 1152   GLUCOSE 91 03/16/2019 0808   BUN 15 06/29/2022 1152   CREATININE 0.71 06/29/2022 1152   CALCIUM 9.6 06/29/2022 1152   PROT 6.7 06/29/2022 1152   ALBUMIN 4.3 06/29/2022 1152   AST 22 06/29/2022 1152   ALT 14 06/29/2022 1152   ALKPHOS 60 06/29/2022 1152   BILITOT 0.4 06/29/2022 1152   GFRNONAA >60 03/16/2019 0808   GFRAA >60 03/16/2019 0808   No results found for: "CHOL", "HDL", "LDLCALC", "LDLDIRECT", "TRIG", "CHOLHDL" No results found for: "HGBA1C" Lab Results  Component Value Date   VITAMINB12 242 03/15/2019   Lab Results  Component Value Date   TSH 1.180 10/05/2021    PHYSICAL EXAM:  There were no vitals filed for this visit. There is no height or weight on file to calculate BMI.   Wt Readings from Last 3 Encounters:  06/29/22 114 lb (51.7 kg)  02/17/22 113 lb 9.6 oz (51.5 kg)  12/13/21 110 lb 8 oz (50.1 kg)     Ht Readings from Last 3 Encounters:  06/29/22 5\' 3"  (1.6 m)  02/17/22 5\' 3"  (1.6 m)  12/13/21 5\' 3"  (1.6 m)      Body mass index is 20  kg/m. Neck size 14 " and Mallampati 3 :  She gained 6-8 pounds.  facial erythema, rosacea is worsening.   Appears older than numeric age  Broken front teeth. No edema at the ankles.  Reynauds is visible.  Generalized: Well developed, in no acute distress  Cardiology: normal rate and rhythm, no murmur noted Respiratory: clear to auscultation bilaterally  Neurological examination  Mentation: Follows all commands speech and language fluent.   Cranial nerve II-XII:No loss of smell or taste-   Pupils were equal round reactive to light. Extraocular movements were full, visual field were full on confrontational test. Facial sensation and strength were normal. Uvula tongue midline. Head turning and shoulder shrug  were normal and symmetric. Motor: full  5 strength of all 4 extremities.  Elevated and symmetric  motor tone.  Grip strength is bilaterally strong.  Sensory: vibration,  soft touch on all 4 extremities intact . She reports cold hypersensitivity-    Coordination:  right sided  dysmetria, not with tremor-  pronator drift left over right, shoulder is injured, rotator cuff. tendenitis.  Gait and station: Gait is mildly wider but stable, no assistive device needed.   Reflexes: Deep tendon reflexes are symmetric bilaterally- brisk all over. Hyperreflexia. .     ASSESSMENT AND PLAN 72 y.o. year old female  here with: 72 y.o. 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  Short term memory impairment, concerned about dementia.  No family history of dementia.  "Blips" in conversations, in the timeframe of hours to days, but her long term memory is preserved.    1) one more seizure event reported without fall and without injuries.  Keep on Lamictal 200 mg daily, can be divided 100 MG bid.  2) Memory loss,decline cognitively- no MOCA today, neuropsychology will see her soon. I have started her on Aricept 5 mg bid, up from 5 mg daily.    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 Gliosis.   I plan to follow up either personally or through our NP within 2-3 months. Following up on neuropsychological testing battery.    After spending a total time of  30  minutes face to face and additional time for physical and neurologic examination, review of laboratory studies,  personal review of imaging studies, reports and results of other testing and review of referral information / records as far as provided in visit,   Electronically signed by: Larey Seat, MD 08/18/2022 1:47 PM  [facility] [place] certified by The [employer] of Sleep Medicine and Diplomate of the [employer] of Sleep Medicine. Board certified In Neurology through the [place], Fellow of the [employer] of Neurology. Medical Director of [employer].

## 2022-08-24 ENCOUNTER — Encounter: Payer: Self-pay | Admitting: Psychology

## 2022-08-24 ENCOUNTER — Encounter: Payer: Medicare HMO | Attending: Psychology | Admitting: Psychology

## 2022-08-24 DIAGNOSIS — S069X6S Unspecified intracranial injury with loss of consciousness greater than 24 hours without return to pre-existing conscious level with patient surviving, sequela: Secondary | ICD-10-CM | POA: Diagnosis not present

## 2022-08-24 DIAGNOSIS — G3184 Mild cognitive impairment, so stated: Secondary | ICD-10-CM | POA: Insufficient documentation

## 2022-08-24 DIAGNOSIS — R569 Unspecified convulsions: Secondary | ICD-10-CM | POA: Diagnosis not present

## 2022-08-24 DIAGNOSIS — F1021 Alcohol dependence, in remission: Secondary | ICD-10-CM | POA: Diagnosis not present
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
## 2022-08-27 ENCOUNTER — Other Ambulatory Visit: Payer: Self-pay | Admitting: Neurology

## 2022-08-29 NOTE — Telephone Encounter (Signed)
Patient developed diarrhea and nausea at 10 mg daily dose, that's why we split it into 2 of 5 mg.

## 2022-09-09 ENCOUNTER — Other Ambulatory Visit: Payer: Self-pay | Admitting: Neurology

## 2022-09-13 ENCOUNTER — Telehealth: Payer: Self-pay | Admitting: *Deleted

## 2022-09-13 NOTE — Telephone Encounter (Signed)
Pt call need refill asap for donepezil. Please call [facility]. Thanks

## 2022-09-14 ENCOUNTER — Other Ambulatory Visit: Payer: Self-pay | Admitting: Neurology

## 2022-09-14 MED ORDER — DONEPEZIL HCL 5 MG PO TABS: 5.0000 mg | ORAL_TABLET | Freq: Two times a day (BID) | ORAL | 1 refills | Status: DC

## 2022-09-14 NOTE — Telephone Encounter (Addendum)
I called and LMVM for pt or daughter that was sending in prescription for donepeil  po bid cancelling the  tablets.  Please callback if questions.

## 2022-09-19 ENCOUNTER — Emergency Department (HOSPITAL_COMMUNITY): Payer: Medicare HMO

## 2022-09-19 ENCOUNTER — Other Ambulatory Visit: Payer: Self-pay

## 2022-09-19 ENCOUNTER — Emergency Department (HOSPITAL_COMMUNITY)
Admission: EM | Admit: 2022-09-19 | Discharge: 2022-09-20 | Disposition: A | Discharge status: Home / Self Care | Payer: Medicare HMO | Attending: Student | Admitting: Student

## 2022-09-19 ENCOUNTER — Encounter (HOSPITAL_COMMUNITY): Payer: Self-pay

## 2022-09-19 DIAGNOSIS — R569 Unspecified convulsions: Secondary | ICD-10-CM

## 2022-09-19 DIAGNOSIS — G309 Alzheimer's disease, unspecified: Secondary | ICD-10-CM | POA: Insufficient documentation

## 2022-09-19 DIAGNOSIS — R319 Hematuria, unspecified: Secondary | ICD-10-CM | POA: Diagnosis not present

## 2022-09-19 DIAGNOSIS — N390 Urinary tract infection, site not specified: Secondary | ICD-10-CM | POA: Diagnosis not present

## 2022-09-19 LAB — URINALYSIS, ROUTINE W REFLEX MICROSCOPIC
Bilirubin Urine: NEGATIVE
Glucose, UA: NEGATIVE mg/dL
Hgb urine dipstick: NEGATIVE
Ketones, ur: NEGATIVE mg/dL
Nitrite: NEGATIVE
Protein, ur: 100 mg/dL — AB
Specific Gravity, Urine: 1.014 (ref 1.005–1.030)
WBC, UA: >50 WBC/hpf (ref 0–5)
pH: 7 (ref 5.0–8.0)

## 2022-09-19 LAB — CBC WITH DIFFERENTIAL/PLATELET
Abs Immature Granulocytes: 0.01 10*3/uL (ref 0.00–0.07)
Basophils Absolute: 0 10*3/uL (ref 0.0–0.1)
Basophils Relative: 1 %
Eosinophils Absolute: 0 10*3/uL (ref 0.0–0.5)
Eosinophils Relative: 0 %
HCT: 47.6 % — ABNORMAL HIGH (ref 36.0–46.0)
Hemoglobin: 15.3 g/dL — ABNORMAL HIGH (ref 12.0–15.0)
Immature Granulocytes: 0 %
Lymphocytes Relative: 15 %
Lymphs Abs: 0.8 10*3/uL (ref 0.7–4.0)
MCH: 32.1 pg (ref 26.0–34.0)
MCHC: 32.1 g/dL (ref 30.0–36.0)
MCV: 99.8 fL (ref 80.0–100.0)
Monocytes Absolute: 0.4 10*3/uL (ref 0.1–1.0)
Monocytes Relative: 8 %
Neutro Abs: 3.9 10*3/uL (ref 1.7–7.7)
Neutrophils Relative %: 76 %
Platelets: 105 10*3/uL — ABNORMAL LOW (ref 150–400)
RBC: 4.77 MIL/uL (ref 3.87–5.11)
RDW: 12 % (ref 11.5–15.5)
WBC: 5.2 10*3/uL (ref 4.0–10.5)
nRBC: 0 % (ref 0.0–0.2)

## 2022-09-19 LAB — COMPREHENSIVE METABOLIC PANEL
ALT: 23 U/L (ref 0–44)
AST: 29 U/L (ref 15–41)
Albumin: 4 g/dL (ref 3.5–5.0)
Alkaline Phosphatase: 66 U/L (ref 38–126)
Anion gap: 12 (ref 5–15)
BUN: 15 mg/dL (ref 8–23)
CO2: 22 mmol/L (ref 22–32)
Calcium: 9.1 mg/dL (ref 8.9–10.3)
Chloride: 102 mmol/L (ref 98–111)
Creatinine, Ser: 0.7 mg/dL (ref 0.44–1.00)
GFR, Estimated: >60 mL/min (ref >60)
Glucose, Bld: 113 mg/dL — ABNORMAL HIGH (ref 70–99)
Potassium: 4.7 mmol/L (ref 3.5–5.1)
Sodium: 136 mmol/L (ref 135–145)
Total Bilirubin: 0.4 mg/dL (ref 0.3–1.2)
Total Protein: 7.1 g/dL (ref 6.5–8.1)

## 2022-09-19 LAB — RAPID URINE DRUG SCREEN, HOSP PERFORMED
Amphetamines: NOT DETECTED
Barbiturates: NOT DETECTED
Benzodiazepines: POSITIVE — AB
Cocaine: NOT DETECTED
Opiates: NOT DETECTED
Tetrahydrocannabinol: NOT DETECTED

## 2022-09-19 MED ORDER — SODIUM CHLORIDE 0.9 % IV SOLN
2.0000 g | Freq: Once | INTRAVENOUS | Status: AC
  Administered 2022-09-20: 2 g via INTRAVENOUS
  Filled 2022-09-19: qty 20

## 2022-09-19 MED ORDER — CEFADROXIL 500 MG PO CAPS: 500.0000 mg | ORAL_CAPSULE | Freq: Two times a day (BID) | ORAL | 0 refills | Status: AC

## 2022-09-19 NOTE — ED Triage Notes (Signed)
Patient BIB EMS. Patient has hx of seizures. Per family patient had 4 witnessed seizures in a row. Unknown the time of each seizure. On EMS arrival patient had a 5th seizure lasting >1 min. 2.5 mg versed given. Patient postictal on arrival. Withdraws from pain.

## 2022-09-19 NOTE — ED Provider Notes (Signed)
[place] [facility]ERGENC[facility] DEPARTMENT AT [facility] Provider Note  CSN: [id number] Arrival date & time: 09/19/22 2014  Chief Complaint(s) Seizures  HPI Shannon Barry is a 72 y.o. 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  History obtained from patient's daughter-in-law who states that the patient lives in their basement and had multiple seizures less than 5 minutes, GTC in origin.  These were seen by EMS (seizure #3) who administered 2.5 of Versed with cessation of seizure.  Here in the emergency room, patient is sluggish and postictal but able to intermittently answer questions.   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   History of alcohol abuse 11/10/2015   Alzheimer's disease (HCC) 11/10/2015   Mixed bipolar I disorder 09/24/2015   Alcohol abuse 09/24/2015   OCD (obsessive compulsive disorder) 09/24/2015   Hyponatremia 07/11/2015   Chest pain 07/11/2015   Nausea and vomiting 07/11/2015   Seizures 07/11/2015   Vertigo    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 Authorizing Provider  cefadroxil (DURICEF) 500 MG capsule Take 1 capsule (500 mg total) by mouth 2 (two) times daily for 7 days. 09/19/22 09/26/22 Yes Taisha Pennebaker, MD  cetirizine (ZYRTEC ALLERGY) 10 MG tablet Take 1 tablet (10 mg total) by mouth daily. 10/05/21   [name], FNP  desvenlafaxine (PRISTIQ) 50 MG 24 hr tablet Take 1 tablet (50 mg total) by mouth daily. 10/05/21   [name], FNP  donepezil (ARICEPT) 5 MG tablet Take 1 tablet (5 mg total) by mouth in the morning and at bedtime. 09/14/22   Dohmeier, Porfirio Mylar, MD  lamoTRIgine (LAMICTAL XR) 100 MG 24 hour tablet TAKE 2 tablets a day po 08/18/22   Dohmeier, Porfirio Mylar, MD  levothyroxine (SYNTHROID) 88 MCG tablet Take 1 tablet (88 mcg total) by mouth daily before breakfast. Take 2 tablets on Sundays 08/18/22   Dohmeier, Porfirio Mylar, MD  Past Surgical History Past Surgical History:  Procedure Laterality Date   right hand surgey  Right    shoulder injury  Left    traumatic head injury  N/A    URETERAL REIMPLANTATION OF TRANSPLANTED KIDNEY N/A    Family History History reviewed. No pertinent family history.  Social History Social History   Tobacco Use   Smoking status: Never   Smokeless tobacco: Never  Vaping Use   Vaping Use: Never used  Substance Use Topics   Alcohol use: No    Alcohol/week: 0.0 standard drinks of alcohol   Drug use: No   Allergies Penicillins and Sulfa antibiotics  Review of Systems Review of Systems  Neurological:  Positive for seizures.    Physical Exam Vital Signs  I have reviewed the triage vital signs BP 121/63   Pulse 96   Temp 97.8 F (36.6 C) (Axillary)   Resp 12   Ht 5\' 3"  (1.6 m)   Wt 53.1 kg   SpO2 94%   BMI 20.74 kg/m   Physical Exam Vitals and nursing note reviewed.  Constitutional:      General: She is not in  acute distress.    Appearance: She is well-developed.  HENT:     Head: Normocephalic and atraumatic.  Eyes:     Conjunctiva/sclera: Conjunctivae normal.  Cardiovascular:     Rate and Rhythm: Normal rate and regular rhythm.     Heart sounds: No murmur heard. Pulmonary:     Effort: Pulmonary effort is normal. No respiratory distress.     Breath sounds: Normal breath sounds.  Abdominal:     Palpations: Abdomen is soft.     Tenderness: There is no abdominal tenderness.  Musculoskeletal:        General: No swelling.     Cervical back: Neck supple.  Skin:    General: Skin is warm and dry.     Capillary Refill: Capillary refill takes less than 2 seconds.  Neurological:     Mental Status: She is alert. She is disoriented.  Psychiatric:        Mood and Affect: Mood normal.     ED Results and Treatments Labs (all labs ordered are listed, but only abnormal results are displayed) Labs Reviewed  CBC WITH DIFFERENTIAL/PLATELET - Abnormal; Notable for the following components:      Result Value   Hemoglobin 15.3 (*)    HCT 47.6 (*)    Platelets 105 (*)    All other components within normal limits  URINALYSIS, ROUTINE W REFLEX MICROSCOPIC - Abnormal; Notable for the following components:   APPearance TURBID (*)    Protein, ur 100 (*)    Leukocytes,Ua LARGE (*)    Bacteria, UA MANY (*)    Non Squamous Epithelial 0-5 (*)    All other components within normal limits  RAPID URINE DRUG SCREEN, HOSP PERFORMED - Abnormal; Notable for the following components:   Benzodiazepines POSITIVE (*)    All other components within normal limits  COMPREHENSIVE METABOLIC PANEL - Abnormal; Notable for the following components:   Glucose, Bld 113 (*)    All other components within normal limits  TSH  LAMOTRIGINE LEVEL  VALPROIC ACID LEVEL  Radiology CT Head Wo Contrast  Result  Date: 09/19/2022 CLINICAL DATA:  Witnessed seizure. EXAM: CT HEAD WITHOUT CONTRAST TECHNIQUE: Contiguous axial images were obtained from the base of the skull through the vertex without intravenous contrast. RADIATION DOSE REDUCTION: This exam was performed according to the departmental dose-optimization program which includes automated exposure control, adjustment of the mA and/or kV according to patient size and/or use of iterative reconstruction technique. COMPARISON:  March 12, 2021 FINDINGS: Brain: There is generalized cerebral atrophy with widening of the extra-axial spaces and ventricular dilatation. There are areas of decreased attenuation within the white matter tracts of the supratentorial brain, consistent with microvascular disease changes. A small area of right posterior parietal encephalomalacia is seen. Vascular: No hyperdense vessel or unexpected calcification. Skull: Right posterior parietal burr holes are seen. Sinuses/Orbits: No acute finding. Other: None. IMPRESSION: 1. No acute intracranial abnormality. 2. Generalized cerebral atrophy and microvascular disease changes of the supratentorial brain. Electronically Signed   By: Aram Candela M.D.   On: 09/19/2022 21:19    Pertinent labs & imaging results that were available during my care of the patient were reviewed by me and considered in my medical decision making (see MDM for details).  Medications Ordered in ED Medications  cefTRIAXone (ROCEPHIN) 2 g in sodium chloride 0.9 % 100 mL IVPB (has no administration in time range)                                                                                                                                     Procedures Procedures  (including critical care time)  Medical Decision Making / ED Course   This patient presents to the ED for concern of seizure, this involves an extensive number of treatment options, and is a complaint that carries with it a high risk of complications  and morbidity.  The differential diagnosis includes medication noncompliance, intracranial injury, intracranial bleed, electrolyte abnormality, UTI, illicit substance use, failure of outpatient regimen  MDM: Patient seen emergency room for evaluation of breakthrough seizures.  Physical exam initially with a somnolent patient but neurologic exam unremarkable with no focal motor or sensory deficits.  Laboratory evaluation largely unremarkable.  UDS positive for benzodiazepines likely correlating with EMS administration.  Urinalysis is concerning for urinary tract infection with large leukocyte esterase, 21-50 red blood cells, greater than 50 white blood cells and many bacteria.  CT head unremarkable.  Low suspicion for septic stone given normal creatinine and no flank pain.  On reevaluation, patient has returned to normal mental status baseline.  I spoke with Dr. Iver Nestle of neurology who is recommending both lamotrigine and Depakote levels prior to discharge and treatment of the UTI with close outpatient neurology follow-up.  Would not alter AED regimen at this time.  I did reiterate to patient's family that patient will need close supervision to ensure that she is taking her AED regimen appropriately.  Patient treated with ceftriaxone  will be discharged on Duricef for UTI.  Patient and family instructed to call her primary neurologist Dr. Vickey Huger tomorrow for close follow-up.  Patient then discharged with outpatient follow-up.   Additional history obtained: -Additional history obtained from multiple family members -External records from outside source obtained and reviewed including: Chart review including previous notes, labs, imaging, consultation notes   Lab Tests: -I ordered, reviewed, and interpreted labs.   The pertinent results include:   Labs Reviewed  CBC WITH DIFFERENTIAL/PLATELET - Abnormal; Notable for the following components:      Result Value   Hemoglobin 15.3 (*)    HCT 47.6 (*)     Platelets 105 (*)    All other components within normal limits  URINALYSIS, ROUTINE W REFLEX MICROSCOPIC - Abnormal; Notable for the following components:   APPearance TURBID (*)    Protein, ur 100 (*)    Leukocytes,Ua LARGE (*)    Bacteria, UA MANY (*)    Non Squamous Epithelial 0-5 (*)    All other components within normal limits  RAPID URINE DRUG SCREEN, HOSP PERFORMED - Abnormal; Notable for the following components:   Benzodiazepines POSITIVE (*)    All other components within normal limits  COMPREHENSIVE METABOLIC PANEL - Abnormal; Notable for the following components:   Glucose, Bld 113 (*)    All other components within normal limits  TSH  LAMOTRIGINE LEVEL  VALPROIC ACID LEVEL      EKG   EKG Interpretation  Date/Time:  Monday September 19 2022 20:30:02 EDT Ventricular Rate:  87 PR Interval:  170 QRS Duration: 70 QT Interval:  368 QTC Calculation: 443 R Axis:   83 Text Interpretation: Sinus rhythm Borderline right axis deviation Confirmed by Karandeep Resende ([id number]) on 09/19/2022 8:44:51 PM         Imaging Studies ordered: I ordered imaging studies including CT head I independently visualized and interpreted imaging. I agree with the radiologist interpretation   Medicines ordered and prescription drug management: Meds ordered this encounter  Medications   cefTRIAXone (ROCEPHIN) 2 g in sodium chloride 0.9 % 100 mL IVPB    Order Specific Question:   Antibiotic Indication:    Answer:   UTI   cefadroxil (DURICEF) 500 MG capsule    Sig: Take 1 capsule (500 mg total) by mouth 2 (two) times daily for 7 days.    Dispense:  14 capsule    Refill:  0    -I have reviewed the patients home medicines and have made adjustments as needed  Critical interventions none  Consultations Obtained: I requested consultation with the neurologist on-call Dr. Iver Nestle,  and discussed lab and imaging findings as well as pertinent plan - they recommend: Depakote and lamotrigine levels,  treat the UTI, close outpatient neurology follow-up   Cardiac Monitoring: The patient was maintained on a cardiac monitor.  I personally viewed and interpreted the cardiac monitored which showed an underlying rhythm of: NSR  Social Determinants of Health:  Factors impacting patients care include: none   Reevaluation: After the interventions noted above, I reevaluated the patient and found that they have :improved  Co morbidities that complicate the patient evaluation  Past Medical History:  Diagnosis Date   Allergy    Depression    Seizures    Thyroid disease    Traumatic brain injury       Dispostion: I considered admission for this patient, but at this time with return to normal mental status baseline, she does not meet inpatient criteria  for admission she is safe for discharge with close outpatient neurology follow-up     Final Clinical Impression(s) / ED Diagnoses Final diagnoses:  Seizure  Urinary tract infection with hematuria, site unspecified     @    Glendora Score, MD 09/19/22 [phone]

## 2022-09-20 LAB — TSH: TSH: 5.414 u[IU]/mL — ABNORMAL HIGH (ref 0.350–4.500)

## 2022-09-20 LAB — VALPROIC ACID LEVEL: Valproic Acid Lvl: <10 ug/mL — ABNORMAL LOW (ref 50.0–100.0)

## 2022-09-21 LAB — LAMOTRIGINE LEVEL: Lamotrigine Lvl: 9.8 ug/mL (ref 2.0–20.0)

## 2022-09-29 ENCOUNTER — Other Ambulatory Visit: Payer: Self-pay | Admitting: Neurology

## 2022-10-04 ENCOUNTER — Encounter: Payer: Medicare HMO | Attending: Psychology

## 2022-10-04 DIAGNOSIS — R569 Unspecified convulsions: Secondary | ICD-10-CM | POA: Insufficient documentation

## 2022-10-04 DIAGNOSIS — G3184 Mild cognitive impairment, so stated: Secondary | ICD-10-CM | POA: Insufficient documentation

## 2022-10-04 DIAGNOSIS — S069X6S Unspecified intracranial injury with loss of consciousness greater than 24 hours without return to pre-existing conscious level with patient surviving, sequela: Secondary | ICD-10-CM | POA: Diagnosis not present

## 2022-10-04 DIAGNOSIS — F0280 Dementia in other diseases classified elsewhere without behavioral disturbance: Secondary | ICD-10-CM | POA: Diagnosis not present

## 2022-10-04 DIAGNOSIS — G309 Alzheimer's disease, unspecified: Secondary | ICD-10-CM

## 2022-10-04 DIAGNOSIS — R4701 Aphasia: Secondary | ICD-10-CM

## 2022-10-04 DIAGNOSIS — F1027 Alcohol dependence with alcohol-induced persisting dementia: Secondary | ICD-10-CM

## 2022-10-06 ENCOUNTER — Encounter: Payer: Self-pay | Admitting: Neurology

## 2022-10-06 ENCOUNTER — Ambulatory Visit: Payer: Medicare HMO | Admitting: Neurology

## 2022-10-06 VITALS — BP 129/77 | HR 71 | Ht 63.0 in | Wt 118.0 lb

## 2022-10-06 DIAGNOSIS — S069X5S Unspecified intracranial injury with loss of consciousness greater than 24 hours with return to pre-existing conscious level, sequela: Secondary | ICD-10-CM

## 2022-10-06 DIAGNOSIS — E039 Hypothyroidism, unspecified: Secondary | ICD-10-CM | POA: Diagnosis not present

## 2022-10-06 DIAGNOSIS — G3184 Mild cognitive impairment, so stated: Secondary | ICD-10-CM | POA: Diagnosis not present

## 2022-10-06 DIAGNOSIS — F1021 Alcohol dependence, in remission: Secondary | ICD-10-CM

## 2022-10-06 DIAGNOSIS — G40219 Localization-related (focal) (partial) symptomatic epilepsy and epileptic syndromes with complex partial seizures, intractable, without status epilepticus: Secondary | ICD-10-CM

## 2022-10-06 DIAGNOSIS — R561 Post traumatic seizures: Secondary | ICD-10-CM

## 2022-10-06 MED ORDER — BRIVARACETAM 25 MG PO TABS: ORAL_TABLET | ORAL | 0 refills | Status: DC

## 2022-10-06 MED ORDER — BRIVARACETAM 100 MG PO TABS: 100.0000 mg | ORAL_TABLET | Freq: Two times a day (BID) | ORAL | 5 refills | Status: DC

## 2022-10-06 NOTE — Progress Notes (Signed)
Provider:  Melvyn Novas, MD  Primary Care Physician:  [name], FNP [address] [place] [address]     Referring Provider: [name], Fnp [address] [place],  [place] [address]          Chief Complaint according to patient   Patient presents with:     New Patient (Initial Visit)           HISTORY OF PRESENT ILLNESS:  Shannon Barry is a 72 y.o. female patient who is here for revisit 10/06/2022 for  follow up on Dr Shelva Majestic , seen 2 days ago, no report yet.  She had an ER visit on 09-19-2022, 2 days after her sons wedding. Her family reported seizure like activity.  They are fairly sure that these were seizures, a total of 5 of them.  The family found she had a UTi at the time was febrile. We have weaned her off Depakote and she is on Lamictal only. Her tremor is gone, her gait disorder improved.   She has been taking Aricept bid for GI tolerability, 5 mg. I now learnt she actually had 5 seizures associated with bowel incontinence. Has diarrhea  almost every day, and imodium  helped only temporarily.      I have no results form dr Clelia Croft testing yet.     02-17-2022: I had just the pleasure of seeing Shannon Barry on 7-17 2023 and in the meantime as her daughter-in-law reports today she had at least 2 seizures.(!)   An MRI of the brain has been ordered for posttraumatic seizures that shows changes of previous right parietal craniotomy well-known cystic encephalomalacia and gliosis.  There is some for age advanced general cerebellar atrophy and cerebral atrophy but no acute abnormalities were noted the study was compared with a CT dated 03-12-2021.  All vessels appear flow patent.  The study was read by Dr. Lina Sayre.   The EEG showed a slowed background rhythm between 7 and 8 Hz symmetrically -recording showed an  breach artifact localized  at the high right parietal area . The photic stimulation resulted in no photic entrainment  the patient stayed awake for the whole recording.   This EEG was considered normal except for the breach artifact which is a scalp defect postoperatively. She had burr holes on both parietal areas  to relief blood. She has had a right sided compressed skull fracture.     Medication management: The patient did not bring her medication with her to this visit.    The patient reports she has taken lower doses of Depakote and has not yet started the increase in Lamictal.    We are weaning off Depakote and building up Lamictal to 100 mg bid . I will draw both blood level and liver function tests. Can go to Lamictal 100 mg bid.            CD : 12-15-2021 I have the pleasure of seeing Shannon Barry today and meanwhile 72 year old Caucasian patient with a history of traumatic brain injury, induced coma, cognitive deficits, history of alcohol abuse in the past, and seizure disorder.  She had frequent falls when I initially encountered her in the year 2000 1617 and 19 but her seizures were well controlled.  Now she presents with a uptake in seizure frequency and also reports memory blips short parts of conversations that are essential to the message and not recalled and usually  this conversation may have taken place within hours or days before.  Her long-term memory is described as intact.  She recovered very well from her cognitive baseline when I initially encountered her.  Her neuropsychologist had predicted that she would need to live with assistance but she recovered her ability to do activities of daily living and she even lift for a.  Of time in an assisted living facility.  She has tolerated her antiepileptic medications very well she was on the valproic acid and lamotrigine but also the dose has not changed her response is no longer that of controlled seizures.  She does present with fluent speech otherwise, no sign of aphasia.  She reports sometimes not recalling names, she may misplace items but her main  concern is the inability to store information on a short-term basis.   I wonder if the 'blips' are seizure related?  She has no increase risk of falling.  No dizziness.    I will order a new neuro-psychological evaluation and EEG, and MOCA today. CMET needed.  She is 11 years sober now- no relapses.         07/20/21 ALL: Shannon Barry returns for follow up for seizures. She was last seen 10/12 and reported questionable seizure activity. Event was unwitnessed and patient states she hit her head on fireplace. CT was negative. Labs showed sodium was 126. AED levels stable. She was advised to follow up with PCP regarding hyponatremia. She had a visit with [name], NP on 05/06/2021 to establish care as she had not been seen by previous PCP ([facility] [employer]) in over 2 years. Labs were repeated 05/06/2021. Sodium was 135. T3 was low and Synthroid dose increased.  Since, she reports feeling better. Dizziness has significantly improved. No falls. No seizure like activity. She continues lamotrigine XR 200mg  daily and divalproex ER 500mg  daily. She is taking Synthroid first thing in the morning. She is able to manage her meds and perform ADLs independently. 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 She lives with her son, Shannon Barry, who presents with her today. He states she is "really good right now". She has follow up with PCP 07/28/2021.     Review of Systems: Out of a complete 14 system review, the patient complains of only the following symptoms, and all other reviewed systems are negative.:   Break through seizure.  Social History   Socioeconomic History   Marital status: Divorced    Spouse name: Not on file   Number of children: Not on file   Years of education: Not on file   Highest education level: Not on file  Occupational History   Not on file  Tobacco Use   Smoking status: Never   Smokeless tobacco: Never  Vaping Use   Vaping Use: Never used  Substance and Sexual Activity   Alcohol use: No     Alcohol/week: 0.0 standard drinks of alcohol   Drug use: No   Sexual activity: Yes  Other Topics Concern   Not on file  Social History Narrative   Huge [employer] fan.   She loves college football.    She has a dog named buddy.    Social Determinants of Health   Financial Resource Strain: Low Risk  (10/05/2021)   Overall Financial Resource Strain (CARDIA)    Difficulty of Paying Living Expenses: Not hard at all  Food Insecurity: No Food Insecurity (10/05/2021)   Hunger Vital Sign    Worried About Running Out of Food in the  Last Year: Never true    Ran Out of Food in the Last Year: Never true  Transportation Needs: No Transportation Needs (10/05/2021)   PRAPARE - Administrator, Civil Service (Medical): No    Lack of Transportation (Non-Medical): No  Recent Concern: Transportation Needs - Unmet Transportation Needs (08/04/2021)   PRAPARE - Administrator, Civil Service (Medical): Yes    Lack of Transportation (Non-Medical): No  Physical Activity: Sufficiently Active (10/05/2021)   Exercise Vital Sign    Days of Exercise per Week: 7 days    Minutes of Exercise per Session: 80 min  Stress: No Stress Concern Present (10/05/2021)   [employer] of Occupational Health - Occupational Stress Questionnaire    Feeling of Stress : Not at all  Social Connections: Moderately Integrated (10/05/2021)   Social Connection and Isolation Panel [NHANES]    Frequency of Communication with Friends and Family: More than three times a week    Frequency of Social Gatherings with Friends and Family: Not on file    Attends Religious Services: 1 to 4 times per year    Active Member of [employer] or Organizations: Yes    Attends Engineer, structural: More than 4 times per year    Marital Status: Divorced    No family history on file.  Past Medical History:  Diagnosis Date   Allergy    Depression    Seizures (HCC)    Thyroid disease    Traumatic brain injury [facility])     Past  Surgical History:  Procedure Laterality Date   right hand surgey  Right    shoulder injury  Left    traumatic head injury  N/A    URETERAL REIMPLANTATION OF TRANSPLANTED KIDNEY N/A      Current Outpatient Medications on File Prior to Visit  Medication Sig Dispense Refill   cetirizine (ZYRTEC ALLERGY) 10 MG tablet Take 1 tablet (10 mg total) by mouth daily. 90 tablet 1   desvenlafaxine (PRISTIQ) 50 MG 24 hr tablet Take 1 tablet (50 mg total) by mouth daily. 90 tablet 1   donepezil (ARICEPT) 5 MG tablet Take 1 tablet (5 mg total) by mouth in the morning and at bedtime. 60 tablet 1   lamoTRIgine (LAMICTAL XR) 100 MG 24 hour tablet TAKE 2 tablets a day po 180 tablet 3   levothyroxine (SYNTHROID) 88 MCG tablet Take 1 tablet (88 mcg total) by mouth daily before breakfast. Take 2 tablets on Sundays 30 tablet 0   No current facility-administered medications on file prior to visit.    Allergies  Allergen Reactions   Penicillins Hives    Has patient had a PCN reaction causing immediate rash, facial/tongue/throat swelling, SOB or lightheadedness with hypotension: No Has patient had a PCN reaction causing severe rash involving mucus membranes or skin necrosis: Yes Has patient had a PCN reaction that required hospitalization No Has patient had a PCN reaction occurring within the last 10 years: Yes If all of the above answers are "NO", then may proceed with Cephalosporin use.    Sulfa Antibiotics Other (See Comments)    violently ill     DIAGNOSTIC DATA (LABS, IMAGING, TESTING) - I reviewed patient records, labs, notes, testing and imaging myself where available.  Lab Results  Component Value Date   WBC 5.2 09/19/2022   HGB 15.3 (H) 09/19/2022   HCT 47.6 (H) 09/19/2022   MCV 99.8 09/19/2022   PLT 105 (L) 09/19/2022  Component Value Date/Time   NA 136 09/19/2022 2247   NA 133 (L) 06/29/2022 1152   K 4.7 09/19/2022 2247   CL 102 09/19/2022 2247   CO2 22 09/19/2022 2247   GLUCOSE  113 (H) 09/19/2022 2247   BUN 15 09/19/2022 2247   BUN 15 06/29/2022 1152   CREATININE 0.70 09/19/2022 2247   CALCIUM 9.1 09/19/2022 2247   PROT 7.1 09/19/2022 2247   PROT 6.7 06/29/2022 1152   ALBUMIN 4.0 09/19/2022 2247   ALBUMIN 4.3 06/29/2022 1152   AST 29 09/19/2022 2247   ALT 23 09/19/2022 2247   ALKPHOS 66 09/19/2022 2247   BILITOT 0.4 09/19/2022 2247   BILITOT 0.4 06/29/2022 1152   GFRNONAA >60 09/19/2022 2247   GFRAA >60 03/16/2019 0808   No results found for: "CHOL", "HDL", "LDLCALC", "LDLDIRECT", "TRIG", "CHOLHDL" No results found for: "HGBA1C" Lab Results  Component Value Date   VITAMINB12 242 03/15/2019   Lab Results  Component Value Date   TSH 5.414 (H) 09/19/2022    PHYSICAL EXAM:  Today's Vitals   10/06/22 1500  BP: 129/77  Pulse: 71  Weight: 118 lb (53.5 kg)  Height: 5\' 3"  (1.6 m)   Body mass index is 20.9 kg/m.   Wt Readings from Last 3 Encounters:  10/06/22 118 lb (53.5 kg)  09/19/22 117 lb 1 oz (53.1 kg)  08/18/22 117 lb (53.1 kg)     Ht Readings from Last 3 Encounters:  10/06/22 5\' 3"  (1.6 m)  09/19/22 5\' 3"  (1.6 m)  08/18/22 5\' 3"  (1.6 m)      General: The patient is awake, alert and appears not in acute distress. Neck size 14 " and Mallampati 3 :   facial erythema, rosacea?  Broken front teeth. No edema at the ankles.  Reynauds.  Generalized: Well developed, in no acute distress  Cardiology: normal rate and rhythm, no murmur noted Respiratory: clear to auscultation bilaterally  Neurological examination  Mentation: Alert oriented to time, place, history taking. Follows all commands speech and language fluent.   Cranial nerve II-XII:No loss of smell or taste-  Pupils were equal round reactive to light. Extraocular movements were full, visual field were full on confrontational test. Facial sensation and strength were normal. Uvula tongue midline. Head turning and shoulder shrug  were normal and symmetric. Motor: full  5 strength of all  4 extremities.  symmetric motor tone. Grip strength is bilaterally strong.  Sensory: vibration and temperature soft touch on all 4 extremities. She reports cold hypersensitivity-    Coordination:  good finger-nose-finger with dysmetria, not with tremor  Gait and station: Gait is mildly wider but stable, no assistive device needed. Reflexes: Deep tendon reflexes are symmetric bilaterally.     ASSESSMENT AND PLAN 72 y.o. 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 Had 5 seizures, stool incontinence, had UTI infection,  intractable break through seizures, ow changing to BRIVIACT , starter dose  25 mg bid for 3 days , then 50 mg bid for 3 days, then 75 mg bid 5 days. Final dose is 100 mg bid po.   2) I like to continue lamotrigine , and add the above named briviact. Seizure control has been poor- she has already had 4 sets of breakthrough seizures since the beginning of the calendar year.   3) I like to continue Aricept generic 5 mg bid , imodium.  I am keen on receiving Dr Elyn Aquas  neuropsychological test battery.  I plan to follow up either personally or through our NP within 4-6  months.   I would like to thank [name], FNP for allowing me to meet with and to take care of this pleasant patient.   CC: I will share my notes with Dr Kieth Brightly .  After spending a total time of  35  minutes face to face and additional time for physical and neurologic examination, review of laboratory studies,  personal review of imaging studies, reports and results of other testing and review of referral information / records as far as provided in visit,   Electronically signed by: Melvyn Novas, MD 10/06/2022 3:38 PM  [facility] Sleep Board certified by The [employer] of Sleep Medicine and Diplomate of the [employer] of Sleep Medicine. Board certified In Neurology through the ABPN, Fellow of the  [employer] of Neurology. Medical Director of [employer].

## 2022-10-06 NOTE — Patient Instructions (Addendum)
ASSESSMENT AND PLAN 72 y.o. 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 Had 5 seizures, stool incontinence, had UTI infection,  intractable break through seizures, ow changing to BRIVIACT , starter dose  25 mg bid for 3 days , then 50 mg bid for 3 days, then 75 mg bid 5 days. Final dose is 100 mg bid po.   2) I like to continue lamotrigine , and add the above named briviact. Seizure control has been poor- she has already had 4 sets of breakthrough seizures since the beginning of the calendar year.   3) I like to continue Aricept generic 5 mg bid , imodium.  I am keen on receiving Dr Elyn Aquas  neuropsychological test battery.    I plan to follow up either personally or through our NP within 4-6  months.   I would like to thank [name], FNP for allowing me to meet with and to take care of this pleasant patient.   CC: I will share my notes with Dr Kieth Brightly .Brivaracetam Tablets What is this medication? BRIVARACETAM (briv a RA se tam) prevents and controls seizures in people with epilepsy. It works by calming overactive nerves in your body. This medicine may be used for other purposes; ask your health care provider or pharmacist if you have questions. COMMON BRAND NAME(S): BRIVIACT What should I tell my care team before I take this medication? They need to know if you have any of these conditions: Liver disease Suicidal thoughts, plans, or attempt by you or a family member An unusual or allergic reaction to brivaracetam, other medications, foods, dyes, or preservatives Pregnant or trying to get pregnant Breast-feeding How should I use this medication? Take this medication by mouth with water. Take it as directed on the prescription label at the same time every day. Do not cut, crush, or chew this medication. Swallow the tablets whole. You can take it with or without food. If it upsets your stomach, take it with food. Keep  taking it unless your care team tells you to stop. A special MedGuide will be given to you by the pharmacist with each prescription and refill. Be sure to read this information carefully each time. Talk to your care team about the use of this medication in children. While it may be prescribed for children as young as 1 month for selected conditions, precautions do apply. Overdosage: If you think you have taken too much of this medicine contact a poison control center or emergency room at once. NOTE: This medicine is only for you. Do not share this medicine with others. What if I miss a dose? If you miss a dose, take it as soon as you can. If it is almost time for your next dose, take only that dose. Do not take double or extra doses. What may interact with this medication? Alcohol Carbamazepine Phenytoin Rifampin This list may not describe all possible interactions. Give your health care provider a list of all the medicines, herbs, non-prescription drugs, or dietary supplements you use. Also tell them if you smoke, drink alcohol, or use illegal drugs. Some items may interact with your medicine. What should I watch for while using this medication? Visit your care team for regular checks on your progress. Tell your care team if your symptoms do not start to get better or if they get worse. This medication may affect your coordination, reaction time, or judgment. Do not drive or operate machinery  until you know how this medication affects you. Sit up or stand slowly to reduce the risk of dizzy or fainting spells. Drinking alcohol with this medication can increase the risk of these side effects. Do not suddenly stop taking your medication because you may develop a severe reaction. Your care team will tell you how much medication to take. Watch for new or worsening thoughts of suicide or depression. This includes sudden changes in mood, behaviors, or thoughts. These changes can happen at any time but are  more common in the beginning of treatment or after a change in dose. Call your care team right away if you experience these thoughts or worsening depression. What side effects may I notice from receiving this medication? Side effects that you should report to your care team as soon as possible: Allergic reactions--skin rash, itching, hives, swelling of the face, lips, tongue, or throat Mood and behavior changes--anxiety, nervousness, confusion, hallucinations, irritability, hostility, thoughts of suicide or self-harm, worsening mood, feelings of depression Side effects that usually do not require medical attention (report to your care team if they continue or are bothersome): Dizziness Drowsiness Fatigue Loss of balance or coordination Nausea Uncontrollable eye movements Vomiting This list may not describe all possible side effects. Call your doctor for medical advice about side effects. You may report side effects to FDA at 1-800-FDA-1088. Where should I keep my medication? Keep out of the reach of children and pets. This medication can be abused. Keep it in a safe place to protect it from theft. Do not share it with anyone. It is only for you. Selling or giving away this medication is dangerous and against the law. Store at room temperature between 20 and 25 degrees C (68 and 77 degrees F). Get rid of any unused medication after the expiration date. This medication may cause harm and death if it is taken by other adults, children, or pets. It is important to get rid of the medication as soon as you no longer need it or it is expired. You can do this in two ways: Take the medication to a medication take-back program. Check with your pharmacy or law enforcement to find a location. If you cannot return the medication, check the label or package insert to see if the medication should be thrown out in the garbage or flushed down the toilet. If you are not sure, ask your care team. If it is safe to  put it in the trash, take the medication out of the container. Mix the medication with cat litter, dirt, coffee grounds, or other unwanted substance. Seal the mixture in a bag or container. Put it in the trash. NOTE: This sheet is a summary. It may not cover all possible information. If you have questions about this medicine, talk to your doctor, pharmacist, or health care provider.  2023 Elsevier/Gold Standard (2021-01-07 00:00:00)

## 2022-10-10 ENCOUNTER — Ambulatory Visit (INDEPENDENT_AMBULATORY_CARE_PROVIDER_SITE_OTHER): Payer: Medicare HMO | Admitting: Nurse Practitioner

## 2022-10-10 ENCOUNTER — Encounter: Payer: Self-pay | Admitting: Nurse Practitioner

## 2022-10-10 VITALS — BP 120/60 | HR 82 | Temp 98.7°F | Ht 63.0 in | Wt 119.4 lb

## 2022-10-10 DIAGNOSIS — R569 Unspecified convulsions: Secondary | ICD-10-CM

## 2022-10-10 DIAGNOSIS — F03918 Unspecified dementia, unspecified severity, with other behavioral disturbance: Secondary | ICD-10-CM

## 2022-10-10 DIAGNOSIS — G309 Alzheimer's disease, unspecified: Secondary | ICD-10-CM

## 2022-10-10 DIAGNOSIS — F3341 Major depressive disorder, recurrent, in partial remission: Secondary | ICD-10-CM | POA: Diagnosis not present

## 2022-10-10 DIAGNOSIS — E039 Hypothyroidism, unspecified: Secondary | ICD-10-CM | POA: Diagnosis not present

## 2022-10-10 DIAGNOSIS — Z1231 Encounter for screening mammogram for malignant neoplasm of breast: Secondary | ICD-10-CM

## 2022-10-10 DIAGNOSIS — F0280 Dementia in other diseases classified elsewhere without behavioral disturbance: Secondary | ICD-10-CM | POA: Diagnosis not present

## 2022-10-10 DIAGNOSIS — Z23 Encounter for immunization: Secondary | ICD-10-CM

## 2022-10-10 DIAGNOSIS — Z1211 Encounter for screening for malignant neoplasm of colon: Secondary | ICD-10-CM

## 2022-10-10 DIAGNOSIS — F3160 Bipolar disorder, current episode mixed, unspecified: Secondary | ICD-10-CM

## 2022-10-10 DIAGNOSIS — E2839 Other primary ovarian failure: Secondary | ICD-10-CM

## 2022-10-10 MED ORDER — LEVOTHYROXINE SODIUM 88 MCG PO TABS
88.0000 ug | ORAL_TABLET | Freq: Every day | ORAL | 1 refills | Status: DC
Start: 2022-10-10 — End: 2022-12-09

## 2022-10-10 MED ORDER — DESVENLAFAXINE SUCCINATE ER 50 MG PO TB24: 50.0000 mg | ORAL_TABLET | Freq: Every day | ORAL | 1 refills | Status: DC

## 2022-10-10 NOTE — Progress Notes (Addendum)
Shannon Barry,acting as a Neurosurgeon for Shannon Felts, FNP.,have documented all relevant documentation on the behalf of Shannon Felts, FNP,as directed by  Shannon Felts, FNP while in the presence of Shannon Felts, FNP.    Subjective:     Patient ID: Shannon Barry , female    DOB: 1950-11-21 , 72 y.o.   MRN: [id number]   Chief Complaint  Patient presents with   Hypothyroidism    HPI  Patient presents today for thyroid, patient states compliance with medications and has no other concerns today.   Patient reports she did have seizure where she fell down the steps about a few weeks ago, she also reports another seizure about  a week ago from a UTI. She is now on Lamictal, no longer taking Depakote. She is also on donepezil. She had her husband to call in her pristiq due to being out of the medication.   BP Readings from Last 3 Encounters: 10/10/22 : 120/60 10/06/22 : 129/77 09/20/22 : 119/67       Past Medical History:  Diagnosis Date   Allergy    Depression    Seizures (HCC)    Thyroid disease    Traumatic brain injury (HCC)      History reviewed. No pertinent family history.   Current Outpatient Medications:    brivaracetam (BRIVIACT) 100 MG TABS tablet, Take 1 tablet (100 mg total) by mouth 2 (two) times daily., Disp: 60 tablet, Rfl: 5   brivaracetam (BRIVIACT) 25 MG TABS tablet, Take 25 mg bid for 3 days, followed by 50 mg bid for 3 days, followed by 75 mg bid for 5 days., Disp: 30 tablet, Rfl: 0   cetirizine (ZYRTEC ALLERGY) 10 MG tablet, Take 1 tablet (10 mg total) by mouth daily., Disp: 90 tablet, Rfl: 1   donepezil (ARICEPT) 5 MG tablet, Take 1 tablet (5 mg total) by mouth in the morning and at bedtime., Disp: 60 tablet, Rfl: 1   lamoTRIgine (LAMICTAL XR) 100 MG 24 hour tablet, TAKE 2 tablets a day po, Disp: 180 tablet, Rfl: 3   desvenlafaxine (PRISTIQ) 50 MG 24 hr tablet, Take 1 tablet (50 mg total) by mouth daily., Disp: 90 tablet, Rfl: 1   levothyroxine (SYNTHROID)  88 MCG tablet, Take 1 tablet (88 mcg total) by mouth daily before breakfast., Disp: 90 tablet, Rfl: 1   Allergies  Allergen Reactions   Penicillins Hives    Has patient had a PCN reaction causing immediate rash, facial/tongue/throat swelling, SOB or lightheadedness with hypotension: No Has patient had a PCN reaction causing severe rash involving mucus membranes or skin necrosis: Yes Has patient had a PCN reaction that required hospitalization No Has patient had a PCN reaction occurring within the last 10 years: Yes If all of the above answers are "NO", then may proceed with Cephalosporin use.    Sulfa Antibiotics Other (See Comments)    violently ill     Review of Systems  Constitutional: Negative.  Negative for activity change and fatigue.  Eyes:  Negative for visual disturbance.  Respiratory: Negative.  Negative for choking, shortness of breath and wheezing.   Cardiovascular: Negative.  Negative for chest pain, palpitations and leg swelling.  Gastrointestinal: Negative.   Endocrine: Negative.  Negative for polydipsia, polyphagia and polyuria.  Musculoskeletal: Negative.   Skin: Negative.   Neurological:  Negative for dizziness, weakness and headaches.  Psychiatric/Behavioral: Negative.  Negative for confusion. The patient is not nervous/anxious.      Today's Vitals   10/10/22  1614  BP: 120/60  Pulse: 82  Temp: 98.7 F (37.1 C)  TempSrc: Oral  Weight: 119 lb 6.4 oz (54.2 kg)  Height: 5\' 3"  (1.6 m)  PainSc: 0-No pain   Body mass index is 21.15 kg/m.  Wt Readings from Last 3 Encounters:  10/12/22 119 lb (54 kg)  10/10/22 119 lb 6.4 oz (54.2 kg)  10/06/22 118 lb (53.5 kg)     Objective:  Physical Exam Vitals reviewed.  Constitutional:      General: She is not in acute distress.    Appearance: Normal appearance. She is well-developed.  HENT:     Head: Normocephalic and atraumatic.  Eyes:     Pupils: Pupils are equal, round, and reactive to light.   Cardiovascular:     Rate and Rhythm: Normal rate and regular rhythm.     Pulses: Normal pulses.     Heart sounds: Normal heart sounds. No murmur heard. Pulmonary:     Effort: Pulmonary effort is normal. No respiratory distress.     Breath sounds: Normal breath sounds. No wheezing.  Musculoskeletal:        General: No swelling or tenderness. Normal range of motion.  Skin:    General: Skin is warm and dry.     Capillary Refill: Capillary refill takes less than 2 seconds.  Neurological:     General: No focal deficit present.     Mental Status: She is alert and oriented to person, place, and time.     Cranial Nerves: No cranial nerve deficit.  Psychiatric:        Mood and Affect: Mood normal.        Behavior: Behavior normal.        Thought Content: Thought content normal.        Judgment: Judgment normal.         Assessment And Plan:     1. Acquired hypothyroidism Comments: Will check thyroid levels. Pending results will make changes to medications - levothyroxine (SYNTHROID) 88 MCG tablet; Take 1 tablet (88 mcg total) by mouth daily before breakfast.  Dispense: 90 tablet; Refill: 1 - TSH + free T4; Future  2. Seizures (HCC) Comments: Continue f/u with Neurology  3. Recurrent major depressive disorder, in partial remission (HCC) Comments: Currently controlled, denies having mania episodes. She has been on Pristiq and most recent Rx sent by her husband. - desvenlafaxine (PRISTIQ) 50 MG 24 hr tablet; Take 1 tablet (50 mg total) by mouth daily.  Dispense: 90 tablet; Refill: 1  4. Alzheimer's disease (HCC) Comments: Continue f/u with Neurology  5. Screening mammogram, encounter for Pt instructed on Self Breast Exam.According to ACOG guidelines Women aged 12 and older are recommended to get an annual mammogram. Order placed for mammogram Pt encouraged to get annual mammogram - MM Digital Screening; Future  6. Need for zoster vaccination Transrx signed for shingrix #1, repeat  in 2-6 months - Zoster Recombinant (Shingrix )  7. Decreased estrogen level - DG Bone Density; Future  9. Encounter for screening colonoscopy According to USPTF Colorectal cancer Screening guidelines. Colonoscopy is recommended every 10 years, starting at age 7 years. Will refer to GI for colon cancer screening. - Ambulatory referral to Gastroenterology    Return for 6 month thyroid check; recheck thyroid levels on 6/15; 2nd shingrix 2 months.  Patient was given opportunity to ask questions. Patient verbalized understanding of the plan and was able to repeat key elements of the plan. All questions were answered to their satisfaction.  Shannon Felts, FNP    I, Shannon Felts, FNP, have reviewed all documentation for this visit. The documentation on 10/10/22 for the exam, diagnosis, procedures, and orders are all accurate and complete.  IF YOU HAVE BEEN REFERRED TO A SPECIALIST, IT MAY TAKE 1-2 WEEKS TO SCHEDULE/PROCESS THE REFERRAL. IF YOU HAVE NOT HEARD FROM US/SPECIALIST IN TWO WEEKS, PLEASE GIVE [place] A CALL AT [phone].   THE PATIENT IS ENCOURAGED TO PRACTICE SOCIAL DISTANCING DUE TO THE COVID-19 PANDEMIC.

## 2022-10-10 NOTE — Patient Instructions (Signed)
Hyperthyroidism Hyperthyroidism refers to a thyroid gland that is too active or overactive. The thyroid gland is a small gland located in the lower front part of the neck, just in front of the windpipe (trachea). This gland makes hormones that: Help control how the body uses food for energy (metabolism). Help the heart and brain work well. Keep your bones strong. When the thyroid is overactive, it produces too much of a hormone called thyroxine. What are the causes? This condition may be caused by: Graves' disease. This is a disorder in which the body's disease-fighting system (immune system) attacks the thyroid gland. This is the most common cause. Inflammation of the thyroid gland. A tumor in the thyroid gland. Use of certain medicines, including: Prescription thyroid hormone replacement. Herbal supplements that mimic thyroid hormones. Amiodarone therapy. Solid or fluid-filled lumps within your thyroid gland (thyroid nodules). Taking in a large amount of iodine from foods or medicines. What increases the risk? You are more likely to develop this condition if: You are female. You have a family history of thyroid conditions. You smoke tobacco. You use a medicine called lithium. You take medicines that affect the immune system (immunosuppressants). What are the signs or symptoms? Symptoms of this condition include: Nervousness. Inability to tolerate heat. Diarrhea. Rapid heart rate. Shaky hands. Restlessness. Sleep problems. Other symptoms may include: Heart skipping beats or making extra beats. Unexplained weight loss. Change in the texture of hair or skin. Loss of menstruation. Fatigue. Enlarged thyroid gland or a lump in the thyroid (nodule). You may also have symptoms of Graves' disease, which may include: Protruding eyes. Dry eyes. Red or swollen eyes. Problems with vision. How is this diagnosed? This condition may be diagnosed based on: Your symptoms and medical  history. A physical exam. Blood tests. Thyroid ultrasound. This test involves using sound waves to produce images of the thyroid gland. A thyroid scan. A radioactive substance is injected into a vein, and images show how much iodine is present in the thyroid. Radioactive iodine uptake test (RAIU). A small amount of radioactive iodine is given by mouth to see how much iodine the thyroid absorbs after a certain amount of time. How is this treated? Treatment depends on the cause and severity of the condition. Treatment may include: Medicines to reduce the amount of thyroid hormone your body makes. Medicines to help manage your symptoms. Radioactive iodine treatment (radioiodine therapy). This involves swallowing a small dose of radioactive iodine, in capsule or liquid form, to kill thyroid cells. Surgery to remove part or all of your thyroid gland. You may need to take thyroid hormone replacement medicine for the rest of your life after thyroid surgery. Follow these instructions at home:  Take over-the-counter and prescription medicines only as told by your health care provider. Do not use any products that contain nicotine or tobacco. These products include cigarettes, chewing tobacco, and vaping devices, such as e-cigarettes. If you need help quitting, ask your health care provider. Follow any instructions from your health care provider about diet. You may be instructed to limit foods that contain iodine. Keep all follow-up visits. You will need to have blood tests regularly so that your health care provider can monitor your condition. Where to find more information National Institute of Diabetes and Digestive and Kidney Diseases: niddk.nih.gov Contact a health care provider if: Your symptoms do not get better with treatment. You have a fever. You have abdominal pain. You feel dizzy. You are taking thyroid hormone replacement medicine and: You have   symptoms of depression. You feel like you  are tired all the time. You gain weight. Get help right away if: You have sudden, unexplained confusion or other mental changes. You have chest pain. You have fast or irregular heartbeats (palpitations). You have difficulty breathing. These symptoms may be an emergency. Get help right away. Call 911. Do not wait to see if the symptoms will go away. Do not drive yourself to the hospital. Summary The thyroid gland is a small gland located in the lower front part of the neck, just in front of the windpipe. Hyperthyroidism is when the thyroid gland is too active and produces too much of a hormone called thyroxine. The most common cause is Graves' disease, a disorder in which your immune system attacks the thyroid gland. Hyperthyroidism can cause various symptoms, such as unexplained weight loss, nervousness, inability to tolerate heat, or changes in your heartbeat. Treatment may include medicine to reduce the amount of thyroid hormone your body makes, radioiodine therapy, surgery, or medicines to manage symptoms. This information is not intended to replace advice given to you by your health care provider. Make sure you discuss any questions you have with your health care provider. Document Revised: 07/09/2021 Document Reviewed: 07/09/2021 [employer] Patient Education  2023 [employer]  

## 2022-10-12 ENCOUNTER — Ambulatory Visit (INDEPENDENT_AMBULATORY_CARE_PROVIDER_SITE_OTHER): Payer: Medicare HMO

## 2022-10-12 VITALS — Ht 63.0 in | Wt 119.0 lb

## 2022-10-12 DIAGNOSIS — Z0000 Encounter for general adult medical examination without abnormal findings: Secondary | ICD-10-CM | POA: Diagnosis not present

## 2022-10-12 NOTE — Progress Notes (Signed)
I connected with  Naomia Rigor on 10/12/22 by a audio enabled telemedicine application and verified that I am speaking with the correct person using two identifiers. Daughter in law Florentina Addison was also on the call.  Patient Location: Home  Provider Location: Office/Clinic  I discussed the limitations of evaluation and management by telemedicine. The patient expressed understanding and agreed to proceed.  Subjective:   Yeslin Colfer is a 72 y.o. female who presents for Medicare Annual (Subsequent) preventive examination.  Review of Systems     Cardiac Risk Factors include: advanced age (>16men, >59 women)     Objective:    Today's Vitals   10/12/22 1514  Weight: 119 lb (54 kg)  Height: 5\' 3"  (1.6 m)   Body mass index is 21.08 kg/m.     10/12/2022    3:18 PM 10/05/2021    4:30 PM 03/14/2019    4:13 PM 04/18/2017    1:28 PM 10/25/2015    8:52 PM 10/04/2015    2:32 PM 08/05/2015   10:06 PM  Advanced Directives  Does Patient Have a Medical Advance Directive? Yes No No No No No Yes  Type of Estate agent of [place];Living will      Living will  Does patient want to make changes to medical advance directive?       No - Patient declined  Copy of Healthcare Power of Attorney in Chart? No - copy requested      No - copy requested  Would patient like information on creating a medical advance directive?   No - Patient declined No - Patient declined No - patient declined information No - patient declined information     Current Medications (verified) Outpatient Encounter Medications as of 10/12/2022  Medication Sig   brivaracetam (BRIVIACT) 100 MG TABS tablet Take 1 tablet (100 mg total) by mouth 2 (two) times daily.   brivaracetam (BRIVIACT) 25 MG TABS tablet Take 25 mg bid for 3 days, followed by 50 mg bid for 3 days, followed by 75 mg bid for 5 days.   cetirizine (ZYRTEC ALLERGY) 10 MG tablet Take 1 tablet (10 mg total) by mouth daily.   desvenlafaxine (PRISTIQ) 50 MG  24 hr tablet Take 1 tablet (50 mg total) by mouth daily.   donepezil (ARICEPT) 5 MG tablet Take 1 tablet (5 mg total) by mouth in the morning and at bedtime.   lamoTRIgine (LAMICTAL XR) 100 MG 24 hour tablet TAKE 2 tablets a day po   levothyroxine (SYNTHROID) 88 MCG tablet Take 1 tablet (88 mcg total) by mouth daily before breakfast.   No facility-administered encounter medications on file as of 10/12/2022.    Allergies (verified) Penicillins and Sulfa antibiotics   History: Past Medical History:  Diagnosis Date   Allergy    Depression    Seizures (HCC)    Thyroid disease    Traumatic brain injury [facility])    Past Surgical History:  Procedure Laterality Date   right hand surgey  Right    shoulder injury  Left    traumatic head injury  N/A    URETERAL REIMPLANTATION OF TRANSPLANTED KIDNEY N/A    History reviewed. No pertinent family history. Social History   Socioeconomic History   Marital status: Divorced    Spouse name: Not on file   Number of children: Not on file   Years of education: Not on file   Highest education level: Not on file  Occupational History   Not on file  Tobacco Use   Smoking status: Never   Smokeless tobacco: Never  Vaping Use   Vaping Use: Never used  Substance and Sexual Activity   Alcohol use: No    Alcohol/week: 0.0 standard drinks of alcohol   Drug use: No   Sexual activity: Yes  Other Topics Concern   Not on file  Social History Narrative   Huge [employer] fan.   She loves college football.    She has a dog named buddy.    Social Determinants of Health   Financial Resource Strain: Low Risk  (10/12/2022)   Overall Financial Resource Strain (CARDIA)    Difficulty of Paying Living Expenses: Not hard at all  Food Insecurity: No Food Insecurity (10/12/2022)   Hunger Vital Sign    Worried About Running Out of Food in the Last Year: Never true    Ran Out of Food in the Last Year: Never true  Transportation Needs: No Transportation Needs  (10/12/2022)   PRAPARE - Administrator, Civil Service (Medical): No    Lack of Transportation (Non-Medical): No  Physical Activity: Insufficiently Active (10/12/2022)   Exercise Vital Sign    Days of Exercise per Week: 7 days    Minutes of Exercise per Session: 20 min  Stress: No Stress Concern Present (10/12/2022)   [employer] of Occupational Health - Occupational Stress Questionnaire    Feeling of Stress : Not at all  Social Connections: Moderately Integrated (10/05/2021)   Social Connection and Isolation Panel [NHANES]    Frequency of Communication with Friends and Family: More than three times a week    Frequency of Social Gatherings with Friends and Family: Not on file    Attends Religious Services: 1 to 4 times per year    Active Member of [employer] or Organizations: Yes    Attends Engineer, structural: More than 4 times per year    Marital Status: Divorced    Tobacco Counseling Counseling given: Not Answered   Clinical Intake:  Pre-visit preparation completed: Yes  Pain : No/denies pain     Nutritional Status: BMI of 19-24  Normal Nutritional Risks: None Diabetes: No  How often do you need to have someone help you when you read instructions, pamphlets, or other written materials from your doctor or pharmacy?: 1 - Never  Diabetic? no  Interpreter Needed?: No  Information entered by :: NAllen LPN   Activities of Daily Living    10/12/2022    3:19 PM  In your present state of health, do you have any difficulty performing the following activities:  Hearing? 0  Vision? 1  Comment a little blurry  Difficulty concentrating or making decisions? 1  Walking or climbing stairs? 0  Dressing or bathing? 0  Doing errands, shopping? 1  Preparing Food and eating ? N  Using the Toilet? N  In the past six months, have you accidently leaked urine? N  Do you have problems with loss of bowel control? N  Managing your Medications? N  Managing your  Finances? N  Housekeeping or managing your Housekeeping? N    Patient Care Team: [name], FNP as PCP - General (General Practice) Clarene Duke, Karma Lew, RN as [facility] Care Management  Indicate any recent Medical Services you may have received from other than [facility] providers in the past year (date may be approximate).     Assessment:   This is a routine wellness examination for [place].  Hearing/Vision screen Vision Screening -  Comments:: Regular eye exams, [facility]  Dietary issues and exercise activities discussed: Current Exercise Habits: Home exercise routine, Type of exercise: walking, Time (Minutes): 20, Frequency (Times/Week): 7, Weekly Exercise (Minutes/Week): 140   Goals Addressed             This Visit's Progress    Patient Stated       10/12/2022, wants to do more exercise, [facility]       Depression Screen    10/12/2022    3:19 PM 10/10/2022    4:26 PM 10/10/2022    4:10 PM 10/05/2021    3:56 PM 03/18/2015    3:59 PM 03/10/2015    7:16 PM  PHQ 2/9 Scores  PHQ - 2 Score 0 0 0 0 2 0  PHQ- 9 Score  4   11     Fall Risk    10/12/2022    3:18 PM 10/10/2022    4:10 PM 10/05/2021    3:59 PM 05/06/2021    9:09 AM 10/31/2017    3:10 PM  Fall Risk   Falls in the past year? 0 0 1 1 Yes  Number falls in past yr: 0 0 1 1 1   Injury with Fall? 0 0 1 1 Yes  Risk Factor Category      High Fall Risk  Risk for fall due to : Medication side effect  History of fall(s) History of fall(s)   Follow up Falls prevention discussed;Education provided;Falls evaluation completed Falls evaluation completed Falls evaluation completed Falls evaluation completed Education provided;Falls prevention discussed    FALL RISK PREVENTION PERTAINING TO THE HOME:  Any stairs in or around the home? Yes  If so, are there any without handrails? No  Home free of loose throw rugs in walkways, pet beds, electrical cords, etc? Yes  Adequate lighting in  your home to reduce risk of falls? Yes   ASSISTIVE DEVICES UTILIZED TO PREVENT FALLS:  Life alert? No  Use of a cane, walker or w/c? No  Grab bars in the bathroom? Yes  Shower chair or bench in shower? No  Elevated toilet seat or a handicapped toilet? No   TIMED UP AND GO:  Was the test performed? No .      Cognitive Function:    12/13/2021    2:55 PM 10/31/2017    3:11 PM 08/03/2016   10:45 AM 04/05/2016    1:28 PM 11/10/2015    1:30 PM  MMSE - Mini Mental State Exam  Orientation to time 4 5 4 4 2   Orientation to Place 5 5 5 3 4   Registration 3 3 3 3 3   Attention/ Calculation 4 4 3 4 2   Recall 1 3 1 3 1   Language- name 2 objects 2 2 2 2 2   Language- repeat 1 1 1 1 1   Language- follow 3 step command 3 3 3 3 3   Language- read & follow direction 1 1 1 1 1   Write a sentence 1 1 1 1 1   Copy design 0 1 0 1 0  Total score 25 29 24 26 20       12/13/2021    2:55 PM  Montreal Cognitive Assessment   Visuospatial/ Executive (0/5) 2  Naming (0/3) 2  Attention: Read list of digits (0/2) 1  Attention: Read list of letters (0/1) 1  Attention: Serial 7 subtraction starting at 100 (0/3) 3  Language: Repeat phrase (0/2) 1  Language : Fluency (  0/1) 1  Abstraction (0/2) 2  Delayed Recall (0/5) 2  Orientation (0/6) 5  Total 20      10/12/2022    3:20 PM 10/05/2021    4:00 PM  6CIT Screen  What Year? 0 points 4 points  What month? 0 points 0 points  What time? 0 points 3 points  Count back from 20 0 points 2 points  Months in reverse 0 points 0 points  Repeat phrase 6 points 0 points  Total Score 6 points 9 points    Immunizations Immunization History  Administered Date(s) Administered   Moderna Sars-Covid-2 Vaccination 08/16/2019, 09/19/2019   PNEUMOCOCCAL CONJUGATE-20 10/05/2021   Tdap 10/05/2021   Zoster Recombinat (Shingrix) 10/10/2022    TDAP status: Up to date  Flu Vaccine status: Up to date  Pneumococcal vaccine status: Up to date  Covid-19 vaccine status:  Completed vaccines  Qualifies for Shingles Vaccine? Yes   Zostavax completed Yes   Shingrix Completed?: needs second dose  Screening Tests Health Maintenance  Topic Date Due   COLONOSCOPY (Pts 45-51yrs Insurance coverage will need to be confirmed)  Never done   DEXA SCAN  Never done   MAMMOGRAM  08/17/2018   COVID-19 Vaccine (3 - Moderna risk series) 10/17/2019   Medicare Annual Wellness (AWV)  10/06/2022   Zoster Vaccines- Shingrix (2 of 2) 12/05/2022   INFLUENZA VACCINE  12/29/2022   DTaP/Tdap/Td (2 - Td or Tdap) 10/06/2031   Pneumonia Vaccine 51+ Years old  Completed   Hepatitis C Screening  Completed   HPV VACCINES  Aged Out    Health Maintenance  Health Maintenance Due  Topic Date Due   COLONOSCOPY (Pts 45-39yrs Insurance coverage will need to be confirmed)  Never done   DEXA SCAN  Never done   MAMMOGRAM  08/17/2018   COVID-19 Vaccine (3 - Moderna risk series) 10/17/2019   Medicare Annual Wellness (AWV)  10/06/2022    Colorectal cancer screening: cologuard ordered today  Mammogram status: Ordered 10/10/2022. Pt provided with contact info and advised to call to schedule appt.   Bone Density status: Ordered 10/10/2022. Pt provided with contact info and advised to call to schedule appt.  Lung Cancer Screening: (Low Dose CT Chest recommended if Age 83-80 years, 30 pack-year currently smoking OR have quit w/in 15years.) does not qualify.   Lung Cancer Screening Referral: no  Additional Screening:  Hepatitis C Screening: does qualify; Completed 05/06/2021  Vision Screening: Recommended annual ophthalmology exams for early detection of glaucoma and other disorders of the eye. Is the patient up to date with their annual eye exam?  Yes  Who is the provider or what is the name of the office in which the patient attends annual eye exams? [facility] If pt is not established with a provider, would they like to be referred to a provider to establish care? No .   Dental  Screening: Recommended annual dental exams for proper oral hygiene  Community Resource Referral / Chronic Care Management: CRR required this visit?  No   CCM required this visit?  No      Plan:     I have personally reviewed and noted the following in the patient's chart:   Medical and social history Use of alcohol, tobacco or illicit drugs  Current medications and supplements including opioid prescriptions. Patient is not currently taking opioid prescriptions. Functional ability and status Nutritional status Physical activity Advanced directives List of other physicians Hospitalizations, surgeries, and ER visits in previous 12 months  Vitals Screenings to include cognitive, depression, and falls Referrals and appointments  In addition, I have reviewed and discussed with patient certain preventive protocols, quality metrics, and best practice recommendations. A written personalized care plan for preventive services as well as general preventive health recommendations were provided to patient.     Barb Merino, LPN   1/61/0960   Nurse Notes: none  Due to this being a virtual visit, the after visit summary with patients personalized plan was offered to patient via mail or my-chart. Patient would like to access on my-chart/

## 2022-10-12 NOTE — Patient Instructions (Signed)
[name] , Thank you for taking time to come for your Medicare Wellness Visit. I appreciate your ongoing commitment to your health goals. Please review the following plan we discussed and let me know if I can assist you in the future.   These are the goals we discussed:  Goals      Patient Stated     "I want to be able to still walk"     Patient Stated     10/12/2022, wants to do more exercise, [facility]        This is a list of the screening recommended for you and due dates:  Health Maintenance  Topic Date Due   Colon Cancer Screening  Never done   DEXA scan (bone density measurement)  Never done   Mammogram  08/17/2018   COVID-19 Vaccine (3 - Moderna risk series) 10/17/2019   Zoster (Shingles) Vaccine (2 of 2) 12/05/2022   Flu Shot  12/29/2022   Medicare Annual Wellness Visit  10/12/2023   DTaP/Tdap/Td vaccine (2 - Td or Tdap) 10/06/2031   Pneumonia Vaccine  Completed   Hepatitis C Screening: USPSTF Recommendation to screen - Ages 38-79 yo.  Completed   HPV Vaccine  Aged Out    Advanced directives: Please bring a copy of your POA (Power of Bruno) and/or Living Will to your next appointment.   Conditions/risks identified: none  Next appointment: Follow up in one year for your annual wellness visit    Preventive Care 65 Years and Older, Female Preventive care refers to lifestyle choices and visits with your health care provider that can promote health and wellness. What does preventive care include? A yearly physical exam. This is also called an annual well check. Dental exams once or twice a year. Routine eye exams. Ask your health care provider how often you should have your eyes checked. Personal lifestyle choices, including: Daily care of your teeth and gums. Regular physical activity. Eating a healthy diet. Avoiding tobacco and drug use. Limiting alcohol use. Practicing safe sex. Taking low-dose aspirin every day. Taking vitamin and  mineral supplements as recommended by your health care provider. What happens during an annual well check? The services and screenings done by your health care provider during your annual well check will depend on your age, overall health, lifestyle risk factors, and family history of disease. Counseling  Your health care provider may ask you questions about your: Alcohol use. Tobacco use. Drug use. Emotional well-being. Home and relationship well-being. Sexual activity. Eating habits. History of falls. Memory and ability to understand (cognition). Work and work Astronomer. Reproductive health. Screening  You may have the following tests or measurements: Height, weight, and BMI. Blood pressure. Lipid and cholesterol levels. These may be checked every 5 years, or more frequently if you are over 62 years old. Skin check. Lung cancer screening. You may have this screening every year starting at age 65 if you have a 30-pack-year history of smoking and currently smoke or have quit within the past 15 years. Fecal occult blood test (FOBT) of the stool. You may have this test every year starting at age 57. Flexible sigmoidoscopy or colonoscopy. You may have a sigmoidoscopy every 5 years or a colonoscopy every 10 years starting at age 39. Hepatitis C blood test. Hepatitis B blood test. Sexually transmitted disease (STD) testing. Diabetes screening. This is done by checking your blood sugar (glucose) after you have not eaten for a while (fasting). You may  have this done every 1-3 years. Bone density scan. This is done to screen for osteoporosis. You may have this done starting at age 54. Mammogram. This may be done every 1-2 years. Talk to your health care provider about how often you should have regular mammograms. Talk with your health care provider about your test results, treatment options, and if necessary, the need for more tests. Vaccines  Your health care provider may recommend  certain vaccines, such as: Influenza vaccine. This is recommended every year. Tetanus, diphtheria, and acellular pertussis (Tdap, Td) vaccine. You may need a Td booster every 10 years. Zoster vaccine. You may need this after age 58. Pneumococcal 13-valent conjugate (PCV13) vaccine. One dose is recommended after age 41. Pneumococcal polysaccharide (PPSV23) vaccine. One dose is recommended after age 63. Talk to your health care provider about which screenings and vaccines you need and how often you need them. This information is not intended to replace advice given to you by your health care provider. Make sure you discuss any questions you have with your health care provider. Document Released: 06/12/2015 Document Revised: 02/03/2016 Document Reviewed: 03/17/2015 Elsevier Interactive Patient Education  2017 [place] Prevention in the Home Falls can cause injuries. They can happen to people of all ages. There are many things you can do to make your home safe and to help prevent falls. What can I do on the outside of my home? Regularly fix the edges of walkways and driveways and fix any cracks. Remove anything that might make you trip as you walk through a door, such as a raised step or threshold. Trim any bushes or trees on the path to your home. Use bright outdoor lighting. Clear any walking paths of anything that might make someone trip, such as rocks or tools. Regularly check to see if handrails are loose or broken. Make sure that both sides of any steps have handrails. Any raised decks and porches should have guardrails on the edges. Have any leaves, snow, or ice cleared regularly. Use sand or salt on walking paths during winter. Clean up any spills in your garage right away. This includes oil or grease spills. What can I do in the bathroom? Use night lights. Install grab bars by the toilet and in the tub and shower. Do not use towel bars as grab bars. Use non-skid mats or  decals in the tub or shower. If you need to sit down in the shower, use a plastic, non-slip stool. Keep the floor dry. Clean up any water that spills on the floor as soon as it happens. Remove soap buildup in the tub or shower regularly. Attach bath mats securely with double-sided non-slip rug tape. Do not have throw rugs and other things on the floor that can make you trip. What can I do in the bedroom? Use night lights. Make sure that you have a light by your bed that is easy to reach. Do not use any sheets or blankets that are too big for your bed. They should not hang down onto the floor. Have a firm chair that has side arms. You can use this for support while you get dressed. Do not have throw rugs and other things on the floor that can make you trip. What can I do in the kitchen? Clean up any spills right away. Avoid walking on wet floors. Keep items that you use a lot in easy-to-reach places. If you need to reach something above you, use a strong step  stool that has a grab bar. Keep electrical cords out of the way. Do not use floor polish or wax that makes floors slippery. If you must use wax, use non-skid floor wax. Do not have throw rugs and other things on the floor that can make you trip. What can I do with my stairs? Do not leave any items on the stairs. Make sure that there are handrails on both sides of the stairs and use them. Fix handrails that are broken or loose. Make sure that handrails are as long as the stairways. Check any carpeting to make sure that it is firmly attached to the stairs. Fix any carpet that is loose or worn. Avoid having throw rugs at the top or bottom of the stairs. If you do have throw rugs, attach them to the floor with carpet tape. Make sure that you have a light switch at the top of the stairs and the bottom of the stairs. If you do not have them, ask someone to add them for you. What else can I do to help prevent falls? Wear shoes that: Do not  have high heels. Have rubber bottoms. Are comfortable and fit you well. Are closed at the toe. Do not wear sandals. If you use a stepladder: Make sure that it is fully opened. Do not climb a closed stepladder. Make sure that both sides of the stepladder are locked into place. Ask someone to hold it for you, if possible. Clearly mark and make sure that you can see: Any grab bars or handrails. First and last steps. Where the edge of each step is. Use tools that help you move around (mobility aids) if they are needed. These include: Canes. Walkers. Scooters. Crutches. Turn on the lights when you go into a dark area. Replace any light bulbs as soon as they burn out. Set up your furniture so you have a clear path. Avoid moving your furniture around. If any of your floors are uneven, fix them. If there are any pets around you, be aware of where they are. Review your medicines with your doctor. Some medicines can make you feel dizzy. This can increase your chance of falling. Ask your doctor what other things that you can do to help prevent falls. This information is not intended to replace advice given to you by your health care provider. Make sure you discuss any questions you have with your health care provider. Document Released: 03/12/2009 Document Revised: 10/22/2015 Document Reviewed: 06/20/2014 Elsevier Interactive Patient Education  2017 [employer].

## 2022-10-15 ENCOUNTER — Other Ambulatory Visit (HOSPITAL_COMMUNITY): Payer: Self-pay

## 2022-10-15 ENCOUNTER — Telehealth: Payer: Self-pay

## 2022-10-15 NOTE — Telephone Encounter (Signed)
Pharmacy Patient Advocate Encounter   Received notification from [facility] that prior authorization for Briviact 25MG  tablets is required/requested.   PA submitted on 10/15/2022 to (ins) [employer] via CoverMyMeds Key or (Medicaid) confirmation # [id number] Status is pending

## 2022-10-16 ENCOUNTER — Other Ambulatory Visit (HOSPITAL_COMMUNITY): Payer: Self-pay

## 2022-10-16 NOTE — Telephone Encounter (Signed)
Pharmacy Patient Advocate Encounter  Prior Authorization for Briviact 25MG  Tablet (Starter Dose) has been approved by [employer] (ins).    PA # [id number] Effective dates: 10/15/2022 through 05/30/2023  Copay is $222.99

## 2022-10-17 ENCOUNTER — Other Ambulatory Visit (HOSPITAL_COMMUNITY): Payer: Self-pay

## 2022-10-18 NOTE — Progress Notes (Signed)
Behavioral Observations:  The patient appeared well-groomed and appropriately dressed. Her manners were polite and appropriate to the situation. The patient's attitude towards testing was positive and her effort was good.   Neuropsychology Note  Shannon Barry completed 95 minutes of neuropsychological testing with technician, Staci Acosta, BA, under the supervision of Arley Phenix, PsyD., Clinical Neuropsychologist. The patient did not appear overtly distressed by the testing session, per behavioral observation or via self-report to the technician. Rest breaks were offered.   Clinical Decision Making: In considering the patient's current level of functioning, level of presumed impairment, nature of symptoms, emotional and behavioral responses during clinical interview, level of literacy, and observed level of motivation/effort, a battery of tests was selected by Dr. Kieth Brightly during initial consultation on 08/24/2022. This was communicated to the technician. Communication between the neuropsychologist and technician was ongoing throughout the testing session and changes were made as deemed necessary based on patient performance on testing, technician observations and additional pertinent factors such as those listed above.  Tests Administered: Finger Tapping Test (FTT) Grooved Pegboard Wechsler Adult Intelligence Scale, 4th Edition (WAIS-IV) Wechsler Memory Scale, 4th Edition (WMS-IV); Older Adult Battery   Results:  FTT:    R (DH) Average= 23.6  Percentile Rank= 30th L (NDH) Average= 30.6  Percentile Rank= 39th  Grooved Pegboard:  R (DH) time= 220s  Percentile Rank= 12th L (NDH) stopped at 180s    WAIS-IV: Composite Score Summary  Scale Sum of Scaled Scores Composite Score Percentile Rank 95% Conf. Interval Qualitative Description  Verbal Comprehension 31 VCI 102 55 96-108 Average  Perceptual Reasoning 17 PRI 75 5 70-82 Borderline  Working Memory 17 WMI 92 30 86-99  Average  Processing Speed 8 PSI 68 2 63-80 Extremely Low  Full Scale 73 FSIQ 81 10 77-85 Low Average  General Ability 48 GAI 87 19 82-92 Low Average   Verbal Comprehension Subtests Summary  Subtest Raw Score Scaled Score Percentile Rank Reference Group Scaled Score SEM  Similarities 27 12 75 11 0.95  Vocabulary 32 9 37 9 0.67  Information 14 10 50 11 0.73  The scaled scores in the Reference Group Scaled Score column are based on the performance of examinees aged 20:0-34:11 (i.e., the reference group). See Chapter 6 of the WAIS-IV Technical and Interpretive Manual for more information.  Perceptual Reasoning Subtests Summary  Subtest Raw Score Scaled Score Percentile Rank Reference Group Scaled Score SEM  Block Design 20 7 16 5  0.99  Matrix Reasoning 4 4 2 1  0.90  Visual Puzzles 6 6 9 4  0.99   Working Librarian, academic Raw Score Scaled Score Percentile Rank Reference Group Scaled Score SEM  Digit Span 25 10 50 8 0.73  Arithmetic 10 7 16 7  1.20   Processing Speed Subtests Summary  Subtest Raw Score Scaled Score Percentile Rank Reference Group Scaled Score SEM  Symbol Search 7 3 1 1  1.12  Coding 27 5 5 2  1.12    WMS-IV:  Index Score Summary  Index Sum of Scaled Scores Index Score Percentile Rank 95% Confidence Interval Qualitative Descriptor  Auditory Memory (AMI) 36 94 34 88-101 Average  Visual Memory (VMI) 4 50 <0.1 47-56 Extremely Low  Immediate Memory (IMI) 21 81 10 76-88 Low Average  Delayed Memory (DMI) 19 77 6 71-86 Borderline   Primary Subtest Scaled Score Summary  Subtest Domain Raw Score Scaled Score Percentile Rank  Logical Memory I AM 28 9 37  Logical Memory II AM 12 8 25  Verbal Paired Associates I AM 18 9 37  Verbal Paired Associates II AM 6 10 50  Visual Reproduction I VM 16 3 1   Visual Reproduction II VM 0 1 0.1  Symbol Span VWM 8 6 9    Auditory Memory Process Score Summary  Process Score Raw Score Scaled Score Percentile Rank Cumulative  Percentage (Base Rate)  LM II Recognition 17 - - 26-50%  VPA II Recognition 28 - - 51-75%   Visual Memory Process Score Summary  Process Score Raw Score Scaled Score Percentile Rank Cumulative Percentage (Base Rate)  VR II Recognition 2 - - 10-16%   ABILITY-MEMORY ANALYSIS  Ability Score:  VCI: 102 Date of Testing:  WAIS-IV; WMS-IV 2022/10/04  Predicted Difference Method   Index Predicted WMS-IV Index Score Actual WMS-IV Index Score Difference Critical Value  Significant Difference Y/N Base Rate  Auditory Memory 101 94 7 10.41 N   Visual Memory 101 50 51 7.35 Y <1%  Immediate Memory 101 81 20 9.69 Y 5-10%  Delayed Memory 101 77 24 12.22 Y 4%  Statistical significance (critical value) at the .01 level.    Feedback to Patient: Shannon Barry will return on 11/24/2022 for an interactive feedback session with Dr. Kieth Brightly at which time her test performances, clinical impressions and treatment recommendations will be reviewed in detail. The patient understands she can contact our office should she require our assistance before this time.  95 minutes spent face-to-face with patient administering standardized tests, 30 minutes spent scoring Radiographer, therapeutic). [CPT [id number], [id number]]  Full report to follow.

## 2022-10-20 ENCOUNTER — Encounter: Payer: Self-pay | Admitting: Psychology

## 2022-10-20 ENCOUNTER — Encounter (HOSPITAL_BASED_OUTPATIENT_CLINIC_OR_DEPARTMENT_OTHER): Payer: Medicare HMO | Admitting: Psychology

## 2022-10-20 DIAGNOSIS — R569 Unspecified convulsions: Secondary | ICD-10-CM

## 2022-10-20 DIAGNOSIS — S069X6D Unspecified intracranial injury with loss of consciousness greater than 24 hours without return to pre-existing conscious level with patient surviving, subsequent encounter: Secondary | ICD-10-CM

## 2022-10-20 DIAGNOSIS — G3184 Mild cognitive impairment, so stated: Secondary | ICD-10-CM

## 2022-10-20 DIAGNOSIS — S069X6S Unspecified intracranial injury with loss of consciousness greater than 24 hours without return to pre-existing conscious level with patient surviving, sequela: Secondary | ICD-10-CM

## 2022-10-20 NOTE — Progress Notes (Signed)
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 would be consistent with the majority of deficits directly related to her traumatic brain injury 46 years ago.  I suspect that the worsening in her functioning more recently is a combination of age-related changes with significant previous TBI and focal deficits in right hemisphere brain regions that continue to be well imaged on MRI's including gliosis and encephalomalacia and  right parietal and right temporal brain regions.  I suspect that changes in her corpus callosum and right left white matter connections are likely related to significant TBI to the right hemisphere and years of reduced information flow between right versus left hemispheres.  The patient has continued with seizure activity as well as age-related changes with some magnification due to years of reduced right hemisphere functioning.  These are likely the cause of worsening over the recent past.  This pattern is not consistent with those expected from a progressive degenerative process such as Alzheimer's dementia.  I will sit down with the patient and go over the  results of the current neuropsychological evaluation and make more detailed specific recommendations as far as ways to help cope and manage with her current cognitive difficulties.  The patient should continue with her efforts to better manage her seizure disorder and follow instructions and efforts with her treating neurologist Dr. Vickey Huger.  At this point, the emotional disturbance noted by patient and family would be more consistent with right frontal involvement impacting impulse control and regulation of emotional status.  While the patient has had diagnosis of bipolar affective disorder she was able to successfully get through college and begin her career prior to her TBI.  Given the location of brain region involved primarily with her TBI I do not suspect that this is truly a pseudobulbar affect presentation and while I cannot rule out the possibility of an underlying bipolar affective disorder as there is a family history of bipolar affective disorder I suspect that her mood dysregulation is directly related to her TBI primarily.  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 Clinical Neuropsychologist

## 2022-10-26 ENCOUNTER — Ambulatory Visit: Payer: Medicare HMO | Admitting: Neurology

## 2022-10-30 ENCOUNTER — Ambulatory Visit (INDEPENDENT_AMBULATORY_CARE_PROVIDER_SITE_OTHER): Payer: Medicare HMO

## 2022-10-30 ENCOUNTER — Encounter (HOSPITAL_COMMUNITY): Payer: Self-pay | Admitting: Emergency Medicine

## 2022-10-30 ENCOUNTER — Other Ambulatory Visit: Payer: Self-pay

## 2022-10-30 ENCOUNTER — Ambulatory Visit (HOSPITAL_COMMUNITY)
Admission: EM | Admit: 2022-10-30 | Discharge: 2022-10-30 | Disposition: A | Discharge status: Home / Self Care | Payer: Medicare HMO | Attending: Physician Assistant | Admitting: Physician Assistant

## 2022-10-30 DIAGNOSIS — R051 Acute cough: Secondary | ICD-10-CM

## 2022-10-30 DIAGNOSIS — R059 Cough, unspecified: Secondary | ICD-10-CM

## 2022-10-30 DIAGNOSIS — J40 Bronchitis, not specified as acute or chronic: Secondary | ICD-10-CM | POA: Diagnosis not present

## 2022-10-30 DIAGNOSIS — J329 Chronic sinusitis, unspecified: Secondary | ICD-10-CM

## 2022-10-30 MED ORDER — DOXYCYCLINE HYCLATE 100 MG PO CAPS: 100.0000 mg | ORAL_CAPSULE | Freq: Two times a day (BID) | ORAL | 0 refills | Status: DC

## 2022-10-30 NOTE — ED Triage Notes (Addendum)
Onset one week ago of sore throat, laryngitis, coughing, fatigue, chest tightness sometimes, cough is worse at night.    History of pneumonia.  Patient is fearful of a reoccurance  Is taking zyrtec

## 2022-10-30 NOTE — ED Provider Notes (Signed)
[facility]    CSN: [id number] Arrival date & time: 10/30/22  1310      History   Chief Complaint Chief Complaint  Patient presents with   URI    HPI Shannon Barry is a 72 y.o. female.   Patient presents today with a weeklong history of URI symptoms.  She reports nasal congestion, scratchy throat, hoarseness that has developed in the past several days, and worsening cough.  She reports that initially she thought symptoms were related to seasonal allergies and has been taking her Zyrtec but this has not provided any relief.  She has had worsening symptoms wanting evaluation today.  She reports a history of recurrent pneumonia and that is why she came in.  She denies any recent antibiotics or steroids.  Denies any known sick contacts.  She has not been taking additional over-the-counter medication for symptom management.      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   History of alcohol abuse 11/10/2015   Alzheimer's disease (HCC) 11/10/2015   Mixed bipolar I disorder (HCC) 09/24/2015   Alcohol abuse 09/24/2015   OCD (obsessive compulsive disorder) 09/24/2015   Hyponatremia 07/11/2015   Chest pain 07/11/2015   Nausea and vomiting 07/11/2015   Seizures (HCC) 07/11/2015   Vertigo    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      Home Medications    Prior to Admission medications   Medication Sig Start Date End Date Taking? Authorizing Provider  doxycycline (VIBRAMYCIN) 100 MG capsule Take 1 capsule (100 mg total) by mouth 2 (two) times daily. 10/30/22  Yes Anniah Glick K, PA-C  brivaracetam (BRIVIACT) 100 MG TABS tablet Take 1 tablet (100 mg total) by mouth 2 (two) times daily. 10/06/22   Dohmeier, Porfirio Mylar, MD  brivaracetam (BRIVIACT) 25 MG TABS tablet Take 25 mg bid for 3 days, followed by 50 mg bid for 3 days, followed by 75 mg bid for 5 days. 10/06/22   Dohmeier, Porfirio Mylar, MD  cetirizine (ZYRTEC ALLERGY) 10 MG tablet Take 1 tablet (10 mg total) by mouth daily. 10/05/21   [name], FNP  desvenlafaxine (PRISTIQ) 50 MG 24 hr tablet Take 1 tablet (50 mg total) by mouth daily. 10/10/22   [name], FNP  donepezil (ARICEPT) 5 MG tablet Take 1 tablet (5 mg total) by mouth in the morning and at bedtime. 09/14/22   Dohmeier, Porfirio Mylar, MD  lamoTRIgine (LAMICTAL XR) 100 MG  24 hour tablet TAKE 2 tablets a day po 08/18/22   Dohmeier, Porfirio Mylar, MD  levothyroxine (SYNTHROID) 88 MCG tablet Take 1 tablet (88 mcg total) by mouth daily before breakfast. 10/10/22   [name], FNP    Family History History reviewed. No pertinent family history.  Social History Social History   Tobacco Use   Smoking status: Never   Smokeless tobacco: Never  Vaping Use   Vaping Use: Never used  Substance Use Topics   Alcohol use: No    Alcohol/week: 0.0 standard drinks of alcohol   Drug use: No     Allergies   Penicillins and Sulfa  antibiotics   Review of Systems Review of Systems  Constitutional:  Positive for activity change. Negative for appetite change, fatigue and fever.  HENT:  Positive for congestion, postnasal drip and voice change. Negative for sinus pressure, sneezing, sore throat and trouble swallowing.   Respiratory:  Positive for cough. Negative for shortness of breath.   Cardiovascular:  Negative for chest pain.  Gastrointestinal:  Negative for abdominal pain, diarrhea, nausea and vomiting.  Neurological:  Negative for dizziness, light-headedness and headaches.     Physical Exam Triage Vital Signs ED Triage Vitals  Enc Vitals Group     BP 10/30/22 1412 110/70     Pulse Rate 10/30/22 1412 79     Resp 10/30/22 1412 20     Temp 10/30/22 1412 98.5 F (36.9 C)     Temp Source 10/30/22 1412 Oral     SpO2 10/30/22 1412 96 %     Weight --      Height --      Head Circumference --      Peak Flow --      Pain Score 10/30/22 1409 2     Pain Loc --      Pain Edu? --      Excl. in GC? --    No data found.  Updated Vital Signs BP 110/70 (BP Location: Left Arm)   Pulse 79   Temp 98.5 F (36.9 C) (Oral)   Resp 20   SpO2 96%   Visual Acuity Right Eye Distance:   Left Eye Distance:   Bilateral Distance:    Right Eye Near:   Left Eye Near:    Bilateral Near:     Physical Exam Vitals reviewed.  Constitutional:      General: She is awake. She is not in acute distress.    Appearance: Normal appearance. She is well-developed. She is not ill-appearing.     Comments: Very pleasant female appears stated age in no acute distress sitting comfortably in exam room  HENT:     Head: Normocephalic and atraumatic.     Right Ear: Tympanic membrane, ear canal and external ear normal. Tympanic membrane is not erythematous or bulging.     Left Ear: Tympanic membrane, ear canal and external ear normal. Tympanic membrane is not erythematous or bulging.     Nose:     Right Sinus: No maxillary sinus  tenderness or frontal sinus tenderness.     Left Sinus: No maxillary sinus tenderness or frontal sinus tenderness.     Mouth/Throat:     Pharynx: Uvula midline. Posterior oropharyngeal erythema present. No oropharyngeal exudate.  Cardiovascular:     Rate and Rhythm: Normal rate and regular rhythm.     Heart sounds: Normal heart sounds, S1 normal and S2 normal. No murmur heard. Pulmonary:     Effort: Pulmonary effort is normal.  Breath sounds: Examination of the right-lower field reveals rales. Rales present. No wheezing or rhonchi.  Psychiatric:        Behavior: Behavior is cooperative.      UC Treatments / Results  Labs (all labs ordered are listed, but only abnormal results are displayed) Labs Reviewed - No data to display  EKG   Radiology DG Chest 2 View  Result Date: 10/30/2022 CLINICAL DATA:  Cough.  Rales on the right. EXAM: CHEST - 2 VIEW COMPARISON:  March 14, 2019 FINDINGS: Healed left rib fractures, stable. Mild scarring or atelectasis in the lateral left lung base. No pneumothorax. The lungs are otherwise clear. The cardiomediastinal silhouette is normal. IMPRESSION: Mild scarring or atelectasis in the lateral left lung base. No other acute abnormalities. Electronically Signed   By: Gerome Sam III M.D.   On: 10/30/2022 14:51    Procedures Procedures (including critical care time)  Medications Ordered in UC Medications - No data to display  Initial Impression / Assessment and Plan / UC Course  I have reviewed the triage vital signs and the nursing notes.  Pertinent labs & imaging results that were available during my care of the patient were reviewed by me and considered in my medical decision making (see chart for details).     Patient is well-appearing, afebrile, nontoxic, nontachycardic.  Indication for viral testing as patient has been symptomatic for over a week and this would not change management.  X-ray was obtained that showed scarring in her left  lower lung but no evidence of pneumonia.  Given she has had symptoms for over a week and they are worsening will cover for secondary infection with doxycycline given her penicillin allergy.  Discussed that she is to stay out of the sun while on this medication.  Recommended that she continue over-the-counter medications including antihistamine and Mucinex.  She is to rest and drink plenty of fluid.  Recommended follow-up with her primary care next week if her symptoms or not improving.  If she has any worsening symptoms including fever, worsening cough, shortness of breath, nausea/vomiting, weakness she needs to be seen immediately.  Strict return precautions given.  Final Clinical Impressions(s) / UC Diagnoses   Final diagnoses:  Sinobronchitis  Acute cough     Discharge Instructions      As we discussed, your x-ray showed that you had some scarring in the bottom of your left lung.  There was no evidence of pneumonia.  I am concerned given you have persistent symptoms that have lasted for more than a week that you are developing an infection so we will start on doxycycline 100 mg twice daily for 10 days.  Stay out of the sun while on this medication.  Continue your allergy medication as previously prescribed.  If your symptoms or not improving within a few days please return for reevaluation.  If your symptoms worsen anyway and you have fever, worsening cough, shortness of breath, nausea/vomiting interfering with oral intake, weakness you should be seen immediately.     ED Prescriptions     Medication Sig Dispense Auth. Provider   doxycycline (VIBRAMYCIN) 100 MG capsule Take 1 capsule (100 mg total) by mouth 2 (two) times daily. 20 capsule Suzanna Zahn, Noberto Retort, PA-C      PDMP not reviewed this encounter.   Jeani Hawking, PA-C 10/30/22 1610

## 2022-10-30 NOTE — Discharge Instructions (Signed)
As we discussed, your x-ray showed that you had some scarring in the bottom of your left lung.  There was no evidence of pneumonia.  I am concerned given you have persistent symptoms that have lasted for more than a week that you are developing an infection so we will start on doxycycline 100 mg twice daily for 10 days.  Stay out of the sun while on this medication.  Continue your allergy medication as previously prescribed.  If your symptoms or not improving within a few days please return for reevaluation.  If your symptoms worsen anyway and you have fever, worsening cough, shortness of breath, nausea/vomiting interfering with oral intake, weakness you should be seen immediately.

## 2022-11-04 ENCOUNTER — Telehealth: Payer: Self-pay | Admitting: Neurology

## 2022-11-04 NOTE — Telephone Encounter (Signed)
Pt is requesting a refill for brivaracetam (BRIVIACT) 25 MG TABS tablet.  Pharmacy: [facility] [phone]

## 2022-11-05 ENCOUNTER — Emergency Department (HOSPITAL_COMMUNITY): Payer: Medicare HMO

## 2022-11-05 ENCOUNTER — Emergency Department (HOSPITAL_COMMUNITY)
Admission: EM | Admit: 2022-11-05 | Discharge: 2022-11-05 | Disposition: A | Discharge status: Home / Self Care | Payer: Medicare HMO | Attending: Emergency Medicine | Admitting: Emergency Medicine

## 2022-11-05 ENCOUNTER — Other Ambulatory Visit: Payer: Self-pay

## 2022-11-05 ENCOUNTER — Telehealth: Payer: Self-pay | Admitting: Neurology

## 2022-11-05 DIAGNOSIS — R569 Unspecified convulsions: Secondary | ICD-10-CM | POA: Diagnosis not present

## 2022-11-05 DIAGNOSIS — J181 Lobar pneumonia, unspecified organism: Secondary | ICD-10-CM | POA: Insufficient documentation

## 2022-11-05 DIAGNOSIS — R519 Headache, unspecified: Secondary | ICD-10-CM | POA: Insufficient documentation

## 2022-11-05 DIAGNOSIS — F0390 Unspecified dementia without behavioral disturbance: Secondary | ICD-10-CM | POA: Insufficient documentation

## 2022-11-05 DIAGNOSIS — R509 Fever, unspecified: Secondary | ICD-10-CM | POA: Diagnosis not present

## 2022-11-05 DIAGNOSIS — R059 Cough, unspecified: Secondary | ICD-10-CM | POA: Diagnosis present

## 2022-11-05 DIAGNOSIS — J189 Pneumonia, unspecified organism: Secondary | ICD-10-CM

## 2022-11-05 LAB — LACTIC ACID, PLASMA
Lactic Acid, Venous: 1.8 mmol/L (ref 0.5–1.9)
Lactic Acid, Venous: 0.9 mmol/L (ref 0.5–1.9)

## 2022-11-05 LAB — URINALYSIS, W/ REFLEX TO CULTURE (INFECTION SUSPECTED)
Bilirubin Urine: NEGATIVE
Glucose, UA: NEGATIVE mg/dL
Hgb urine dipstick: NEGATIVE
Ketones, ur: 5 mg/dL — AB
Nitrite: NEGATIVE
Protein, ur: 100 mg/dL — AB
Specific Gravity, Urine: 1.025 (ref 1.005–1.030)
pH: 6 (ref 5.0–8.0)

## 2022-11-05 LAB — RESPIRATORY PANEL BY PCR

## 2022-11-05 LAB — CBC
HCT: 43.4 % (ref 36.0–46.0)
Hemoglobin: 14.3 g/dL (ref 12.0–15.0)
MCH: 31 pg (ref 26.0–34.0)
MCHC: 32.9 g/dL (ref 30.0–36.0)
MCV: 94.1 fL (ref 80.0–100.0)
Platelets: 274 10*3/uL (ref 150–400)
RBC: 4.61 MIL/uL (ref 3.87–5.11)
RDW: 11.8 % (ref 11.5–15.5)
WBC: 6.1 10*3/uL (ref 4.0–10.5)
nRBC: 0 % (ref 0.0–0.2)

## 2022-11-05 LAB — BASIC METABOLIC PANEL
Anion gap: 9 (ref 5–15)
BUN: 15 mg/dL (ref 8–23)
CO2: 27 mmol/L (ref 22–32)
Calcium: 9.2 mg/dL (ref 8.9–10.3)
Chloride: 98 mmol/L (ref 98–111)
Creatinine, Ser: 0.68 mg/dL (ref 0.44–1.00)
GFR, Estimated: >60 mL/min (ref >60)
Glucose, Bld: 126 mg/dL — ABNORMAL HIGH (ref 70–99)
Potassium: 4.6 mmol/L (ref 3.5–5.1)
Sodium: 134 mmol/L — ABNORMAL LOW (ref 135–145)

## 2022-11-05 LAB — CBG MONITORING, ED: Glucose-Capillary: 80 mg/dL (ref 70–99)

## 2022-11-05 LAB — CULTURE, BLOOD (ROUTINE X 2)

## 2022-11-05 MED ORDER — AZITHROMYCIN 250 MG PO TABS: 250.0000 mg | ORAL_TABLET | Freq: Every day | ORAL | 0 refills | Status: DC

## 2022-11-05 MED ORDER — BRIVARACETAM 100 MG PO TABS: 100.0000 mg | ORAL_TABLET | Freq: Two times a day (BID) | ORAL | 0 refills | Status: DC

## 2022-11-05 MED ORDER — SODIUM CHLORIDE 0.9 % IV SOLN
1.0000 g | Freq: Once | INTRAVENOUS | Status: AC
  Administered 2022-11-05: 1 g via INTRAVENOUS
  Filled 2022-11-05: qty 10

## 2022-11-05 MED ORDER — ACETAMINOPHEN 325 MG PO TABS
650.0000 mg | ORAL_TABLET | Freq: Once | ORAL | Status: AC
  Administered 2022-11-05: 650 mg via ORAL
  Filled 2022-11-05: qty 2

## 2022-11-05 MED ORDER — SODIUM CHLORIDE 0.9 % IV SOLN
100.0000 mg | Freq: Once | INTRAVENOUS | Status: AC
  Administered 2022-11-05: 100 mg via INTRAVENOUS
  Filled 2022-11-05: qty 10

## 2022-11-05 MED ORDER — CEPHALEXIN 500 MG PO CAPS: 500.0000 mg | ORAL_CAPSULE | Freq: Three times a day (TID) | ORAL | 0 refills | Status: DC

## 2022-11-05 MED ORDER — LAMOTRIGINE ER 100 MG PO TB24
100.0000 mg | ORAL_TABLET | Freq: Every day | ORAL | Status: DC
  Administered 2022-11-05: 100 mg via ORAL
  Filled 2022-11-05: qty 1

## 2022-11-05 MED ORDER — SODIUM CHLORIDE 0.9 % IV BOLUS
1000.0000 mL | Freq: Once | INTRAVENOUS | Status: AC
  Administered 2022-11-05: 1000 mL via INTRAVENOUS

## 2022-11-05 NOTE — Discharge Instructions (Signed)
You had a seizure and I have refilled your Briviact 100 mg twice daily. You received a dose through your IV and you can pick up your meds tomorrow   I switched your antibiotics to keflex and azithromycin. Please take as prescribed   Take tylenol, motrin for fever   See your doctor for follow up   Return to ER if you have another seizure, worse cough, fever for a week

## 2022-11-05 NOTE — Telephone Encounter (Signed)
Dr. Vickey Huger: Please review and make additional recommendations as necessary.  I received an after-hours message from patient's daughter-in-law, Shannon Barry reporting 2 seizures this morning.  I was able to connect with Shannon Barry.  She reports that patient told her this morning that she had a seizure early morning/middle of the night.  Daughter-in-law then witnessed another 5 to 7-minute grand mal seizure at home, patient was lethargic/confused for about 10 more minutes.  Daughter-in-law reports that patient was seen in urgent care about a week ago and diagnosed with pneumonia and placed on antibiotics which she is still continuing.  Daughter-in-law also states that she had taken her last Briviact dose on Thursday, and has since been out for some reason.  I checked the prescription, she had new prescriptions for Briviact starting dose and maintenance dose on 10/06/2022 but daughter-in-law reports that for some reason that was not picked up recently but [facility]illed Depakote which is not currently on her list of medicines anymore.  Patient did not take any Depakote but for some reason it was picked up from the pharmacy by another caretaker as I understand.  Daughter-in-law was advised to take patient to the emergency room as she may have exacerbation of her pneumonia, new infection such as UTI, dehydration, medication withdrawal seizure also a possibility since she had been out of her Briviact for over 24 hours.  All these possibilities need to be checked and this can be done quickest and best at the emergency room.  In addition, daughter-in-law was advised to make sure patient gets restarted on her Briviact and the prescription should be ready for pickup [facility] on [facility].  She has refills that would carry her through November of this year.  Shannon Barry demonstrated understanding and agreement with the plan.  Of note, patient had not sustained any injuries with any of these 2 seizures.  Upon chart review, there is a refill  request for Briviact 25 mg strength from yesterday, this should have been a one-time prescription and from my understanding a prescription to get her titrated to her maintenance dose, not sure why a refill for the 25 mg strength was requested, she has a valid prescription with refills for the 100 mg strength of Briviact.

## 2022-11-05 NOTE — ED Triage Notes (Signed)
Pt arrived via POV. Pt had 3x seizures today, pt has hx d/t TBI. Recently dx w/pneumonia, and taking abx. Daughter states pt had fever at home today.  AOx4

## 2022-11-05 NOTE — ED Notes (Signed)
Pt resting in bed with no acute distress noted at this time.  

## 2022-11-05 NOTE — ED Provider Notes (Signed)
[place] [facility] Provider Note   CSN: [id number] Arrival date & time: 11/05/22  1403     History  Chief Complaint  Patient presents with   Fever   Seizures   Headache    Shannon Barry is a 72 y.o. female history of traumatic brain injury with seizure, dementia, here presenting with seizure and fever and cough.  Patient was seen in urgent care for cough about a week ago.  She is taking doxycycline.  Patient states that since yesterday she is running fever as well.  She also has persistent cough.  She had low-grade temp 100.5 at home and took Motrin prior to arrival.  Patient also had 5 seizures since yesterday.  Patient was seen here for seizures and of April.  Patient follow-up with neurology and was told to continue Lamictal and add Briviact.  She was supposed to titrate up from 25 to 100 mg.  She apparently ran out of her medicine 2 days ago.   The history is provided by the patient.       Home Medications Prior to Admission medications   Medication Sig Start Date End Date Taking? Authorizing Provider  brivaracetam (BRIVIACT) 100 MG TABS tablet Take 1 tablet (100 mg total) by mouth 2 (two) times daily. 10/06/22   Dohmeier, Porfirio Mylar, MD  brivaracetam (BRIVIACT) 25 MG TABS tablet Take 25 mg bid for 3 days, followed by 50 mg bid for 3 days, followed by 75 mg bid for 5 days. 10/06/22   Dohmeier, Porfirio Mylar, MD  cetirizine (ZYRTEC ALLERGY) 10 MG tablet Take 1 tablet (10 mg total) by mouth daily. 10/05/21   [name], FNP  desvenlafaxine (PRISTIQ) 50 MG 24 hr tablet Take 1 tablet (50 mg total) by mouth daily. 10/10/22   [name], FNP  donepezil (ARICEPT) 5 MG tablet Take 1 tablet (5 mg total) by mouth in the morning and at bedtime. 09/14/22   Dohmeier, Porfirio Mylar, MD  doxycycline (VIBRAMYCIN) 100 MG capsule Take 1 capsule (100 mg total) by mouth 2 (two) times daily. 10/30/22   Raspet, Noberto Retort, PA-C  lamoTRIgine (LAMICTAL XR) 100 MG 24 hour tablet TAKE 2 tablets  a day po 08/18/22   Dohmeier, Porfirio Mylar, MD  levothyroxine (SYNTHROID) 88 MCG tablet Take 1 tablet (88 mcg total) by mouth daily before breakfast. 10/10/22   [name], FNP      Allergies    Penicillins and Sulfa antibiotics    Review of Systems   Review of Systems  Constitutional:  Positive for fever.  Neurological:  Positive for seizures and headaches.  All other systems reviewed and are negative.   Physical Exam Updated Vital Signs BP (!) 150/76   Pulse 81   Temp (!) 101.5 F (38.6 C) (Oral)   Resp 16   Ht 5\' 3"  (1.6 m)   Wt 53.5 kg   SpO2 100%   BMI 20.90 kg/m  Physical Exam Vitals and nursing note reviewed.  Constitutional:      Comments: Chronically ill-appearing and no seizure activity  HENT:     Head: Normocephalic.     Mouth/Throat:     Mouth: Mucous membranes are moist.  Eyes:     Extraocular Movements: Extraocular movements intact.     Pupils: Pupils are equal, round, and reactive to light.  Cardiovascular:     Rate and Rhythm: Normal rate and regular rhythm.     Heart sounds: Normal heart sounds.  Pulmonary:     Effort: Pulmonary  effort is normal.     Comments: Crackles bilateral bases Abdominal:     General: Bowel sounds are normal.     Palpations: Abdomen is soft.  Musculoskeletal:        General: Normal range of motion.     Cervical back: Normal range of motion and neck supple.  Skin:    General: Skin is warm.  Neurological:     Mental Status: She is alert and oriented to person, place, and time.  Psychiatric:        Mood and Affect: Mood normal.     ED Results / Procedures / Treatments   Labs (all labs ordered are listed, but only abnormal results are displayed) Labs Reviewed  BASIC METABOLIC PANEL - Abnormal; Notable for the following components:      Result Value   Sodium 134 (*)    Glucose, Bld 126 (*)    All other components within normal limits  URINALYSIS, W/ REFLEX TO CULTURE (INFECTION SUSPECTED) - Abnormal; Notable for the  following components:   Ketones, ur 5 (*)    Protein, ur 100 (*)    Leukocytes,Ua TRACE (*)    Bacteria, UA RARE (*)    All other components within normal limits  CULTURE, BLOOD (ROUTINE X 2)  CULTURE, BLOOD (ROUTINE X 2)  RESPIRATORY PANEL BY PCR  CBC  LACTIC ACID, PLASMA  LACTIC ACID, PLASMA  CBG MONITORING, ED    EKG None  Radiology DG Chest Port 1 View  Result Date: 11/05/2022 CLINICAL DATA:  Fever and cough.  Seizure activity EXAM: PORTABLE CHEST 1 VIEW COMPARISON:  X-ray 10/30/2022 FINDINGS: Hyperinflation. No pneumothorax, effusion or edema. The extreme inferior costophrenic angles are clipped off the edge of the film. Normal cardiopericardial silhouette. Stable multiple old left-sided rib deformities. Artifact from the patient's clothing. IMPRESSION: Hyperinflation.  No acute cardiopulmonary disease. Chronic left-sided rib deformities Electronically Signed   By: Karen Kays M.D.   On: 11/05/2022 15:40    Procedures Procedures    Medications Ordered in ED Medications  sodium chloride 0.9 % bolus 1,000 mL (has no administration in time range)  lamoTRIgine (LAMICTAL XR) 24 hour tablet 100 mg (has no administration in time range)  acetaminophen (TYLENOL) tablet 650 mg (has no administration in time range)  brivaracetam (BRIVIACT) 100 mg in sodium chloride 0.9 % 50 mL (has no administration in time range)  cefTRIAXone (ROCEPHIN) 1 g in sodium chloride 0.9 % 100 mL IVPB (has no administration in time range)    ED Course/ Medical Decision Making/ A&P                             Medical Decision Making Shannon Barry is a 72 y.o. female here with fever and seizures.  Patient has history of seizures and did miss several doses of her medications.  I think this is likely from medication noncompliance.  She is also running a fever.  She was recently treated for pneumonia with doxycycline.  I think likely recurrent pneumonia versus UTI versus viral syndrome.  Will check CBC and CMP  and lactate and cultures and chest x-ray and urinalysis.  Will hydrate patient and reassess.  Will also give her the Lamictal and Briviact that she missed.  7:50 PM I reviewed patient's labs and white blood cell count is normal.  Lactate is normal.  Urinalysis unremarkable.  Chest x-ray showed hyperinflation and I wonder if she has a persistent pneumonia or  not.  I also sent of respiratory panel.  Given history of pneumonia and questionable pneumonia on the x-ray, I would like to broaden her coverage.  She was given Rocephin in the ED.  Will discharge home with Keflex and azithromycin.  Patient also received a dose of her lamictal and briviact. Will refill her briviact   Problems Addressed: Community acquired pneumonia of left lower lobe of lung: acute illness or injury Seizure [facility]): acute illness or injury  Amount and/or Complexity of Data Reviewed Labs: ordered. Decision-making details documented in ED Course. Radiology: ordered and independent interpretation performed. Decision-making details documented in ED Course.  Risk OTC drugs. Prescription drug management.    Final Clinical Impression(s) / ED Diagnoses Final diagnoses:  None    Rx / DC Orders ED Discharge Orders     None         Charlynne Pander, MD 11/05/22 [phone]

## 2022-11-06 LAB — CULTURE, BLOOD (ROUTINE X 2): Special Requests: ADEQUATE

## 2022-11-07 ENCOUNTER — Other Ambulatory Visit: Payer: Self-pay

## 2022-11-07 LAB — CULTURE, BLOOD (ROUTINE X 2)

## 2022-11-07 NOTE — Telephone Encounter (Signed)
Thank you for FYI

## 2022-11-07 NOTE — Telephone Encounter (Signed)
Call to patient to follow up on seizures from the weekend, and medications, no answer. Left message to call office and I will attempt daughter in law Katie.

## 2022-11-07 NOTE — Telephone Encounter (Signed)
Call back to daughter in law Katie, reviewed lamotrigine and briviact dosing and instructions and Katie verbalized understanding. Katie stated they were waiting on pharmacy to call since they had been out of stock, advised Florentina Addison to contact pharmacy and have them check with other local pharmacy to see if they and in stock. Katie verbalized understanding. No further questions.

## 2022-11-07 NOTE — Telephone Encounter (Signed)
Pt daughter in law called back. Requesting call back from nurse

## 2022-11-07 NOTE — Telephone Encounter (Signed)
Thank You, I hope we get a 6 months period of seizure freedom, otherwise considering VNS .

## 2022-11-07 NOTE — Telephone Encounter (Signed)
Call to [facility], no answer left message to call office for medication clarification.

## 2022-11-07 NOTE — Telephone Encounter (Signed)
Pt is not taking Briviact 25mg (was loading dose), she is now on 100MG  and has refills on file.

## 2022-11-07 NOTE — Progress Notes (Signed)
Titration dose discontinued per Dr. Vickey Huger

## 2022-11-07 NOTE — Telephone Encounter (Signed)
Call to patients Daughter in law Florentina Addison, she states that after talking with Dr. Frances Furbish, she took patient to emergency room.She was told that she was still having a fever and not responding to the antibiotic (doxycycline)  so they switched antibiotics and started her on a Azithromycin and cephalexin. She also states that patient had been managing her own medications and had a mix up with Depakote and Briviact. Inquired on if patient has a pill box and she stated yes but there are also multiple caregivers. Advised on helping patient fill pill box, and keeping a medication log for all caregivers and to remove bottles from patient reach to limit confusion. Katie verbalized understanding. I also reviewed lamotrigine and briviact dosing and Katie at first stated the briviact was 1x a day and lamotrigine was 2x day and and explained according to medication list  appears briviact was to be given 2x a day and the lamotrigine 2 tablets 1x a day and advised I would clarify with Dr. Vickey Huger and follow back  with her. Katie appreciative of call.

## 2022-11-08 LAB — CULTURE, BLOOD (ROUTINE X 2): Culture: NO GROWTH

## 2022-11-10 LAB — CULTURE, BLOOD (ROUTINE X 2): Special Requests: ADEQUATE

## 2022-11-14 ENCOUNTER — Telehealth: Payer: Self-pay

## 2022-11-14 NOTE — Transitions of Care (Post Inpatient/ED Visit) (Signed)
   11/14/2022  Name: Shannon Barry MRN: [id number] DOB: June 02, 1950  Today's TOC FU Call Status: Today's TOC FU Call Status:: Successful TOC FU Call Competed TOC FU Call Complete Date: 11/14/22  Transition Care Management Follow-up Telephone Call Date of Discharge: 11/05/22 Discharge Facility: [facility]) Type of Discharge: Emergency Department Reason for ED Visit: Other: How have you been since you were released from the hospital?: Same Any questions or concerns?: No  Items Reviewed: Did you receive and understand the discharge instructions provided?: Yes Medications obtained,verified, and reconciled?: Yes (Medications Reviewed) Any new allergies since your discharge?: No Dietary orders reviewed?: No Do you have support at home?: Yes People in Home: child(ren), adult Name of Support/Comfort Primary Source: Debbora Presto  Medications Reviewed Today: Medications Reviewed Today     Reviewed by Liberty Handy, RN (Registered Nurse) on 10/30/22 at 1410  Med List Status: <None>   Medication Order Taking? Sig Documenting Provider Last Dose Status Informant  brivaracetam (BRIVIACT) 100 MG TABS tablet [id number]  Take 1 tablet (100 mg total) by mouth 2 (two) times daily. Dohmeier, Porfirio Mylar, MD  Active   brivaracetam (BRIVIACT) 25 MG TABS tablet [id number]  Take 25 mg bid for 3 days, followed by 50 mg bid for 3 days, followed by 75 mg bid for 5 days. Dohmeier, Porfirio Mylar, MD  Active   cetirizine (ZYRTEC ALLERGY) 10 MG tablet [id number]  Take 1 tablet (10 mg total) by mouth daily. [name], FNP  Active   desvenlafaxine (PRISTIQ) 50 MG 24 hr tablet [id number]  Take 1 tablet (50 mg total) by mouth daily. [name], FNP  Active   donepezil (ARICEPT) 5 MG tablet [id number]  Take 1 tablet (5 mg total) by mouth in the morning and at bedtime. Dohmeier, Porfirio Mylar, MD  Active   lamoTRIgine (LAMICTAL XR) 100 MG 24 hour tablet [id number]  TAKE 2 tablets a day po Dohmeier, Porfirio Mylar, MD  Active    levothyroxine (SYNTHROID) 88 MCG tablet [id number]  Take 1 tablet (88 mcg total) by mouth daily before breakfast. [name], FNP  Active   Med List Note Waymond Cera, RN 12/28/15 1040): [facility]             Home Care and Equipment/Supplies: Were Home Health Services Ordered?: NA Any new equipment or medical supplies ordered?: NA  Functional Questionnaire: Do you need assistance with bathing/showering or dressing?: No Do you need assistance with meal preparation?: No Do you need assistance with eating?: No Do you have difficulty maintaining continence: No Do you need assistance with getting out of bed/getting out of a chair/moving?: No Do you have difficulty managing or taking your medications?: No  Follow up appointments reviewed: PCP Follow-up appointment confirmed?: NA Specialist Hospital Follow-up appointment confirmed?: NA Do you need transportation to your follow-up appointment?: No Do you understand care options if your condition(s) worsen?: Yes-patient verbalized understanding    SIGNATUREYL,RMA

## 2022-11-24 ENCOUNTER — Encounter: Payer: Medicare HMO | Attending: Psychology | Admitting: Psychology

## 2022-11-24 DIAGNOSIS — S069X6S Unspecified intracranial injury with loss of consciousness greater than 24 hours without return to pre-existing conscious level with patient surviving, sequela: Secondary | ICD-10-CM | POA: Diagnosis not present

## 2022-11-24 DIAGNOSIS — R569 Unspecified convulsions: Secondary | ICD-10-CM | POA: Diagnosis not present

## 2022-11-24 DIAGNOSIS — F1021 Alcohol dependence, in remission: Secondary | ICD-10-CM | POA: Diagnosis not present

## 2022-11-24 DIAGNOSIS — G3184 Mild cognitive impairment, so stated: Secondary | ICD-10-CM | POA: Diagnosis not present

## 2022-11-30 NOTE — Progress Notes (Signed)
Patient had fever and cough and warrants respiratory viral panel

## 2022-12-09 ENCOUNTER — Other Ambulatory Visit: Payer: Self-pay | Admitting: Nurse Practitioner

## 2022-12-09 DIAGNOSIS — E039 Hypothyroidism, unspecified: Secondary | ICD-10-CM

## 2022-12-13 ENCOUNTER — Ambulatory Visit (INDEPENDENT_AMBULATORY_CARE_PROVIDER_SITE_OTHER): Payer: Medicare HMO

## 2022-12-13 VITALS — BP 116/60 | HR 85 | Temp 98.5°F | Ht 63.0 in | Wt 118.0 lb

## 2022-12-13 DIAGNOSIS — Z23 Encounter for immunization: Secondary | ICD-10-CM

## 2022-12-13 NOTE — Progress Notes (Signed)
Patient presents today for her 2nd shingrix shot.

## 2022-12-25 ENCOUNTER — Encounter: Payer: Self-pay | Admitting: Psychology
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
## 2022-12-27 ENCOUNTER — Ambulatory Visit: Payer: Medicare HMO

## 2023-01-14 ENCOUNTER — Other Ambulatory Visit: Payer: Self-pay | Admitting: Nurse Practitioner

## 2023-01-14 DIAGNOSIS — F3341 Major depressive disorder, recurrent, in partial remission: Secondary | ICD-10-CM

## 2023-01-23 NOTE — Progress Notes (Unsigned)
PATIENT: Shannon Barry DOB: 08-Dec-1950  REASON FOR VISIT: follow up HISTORY FROM: patient  No chief complaint on file.   HISTORY OF PRESENT ILLNESS:  01/23/23 ALL: Shannon Barry returns for follow up. She was last seen by Dr Vickey Huger 09/2022 and reported multiple breakthrough seizures. Lamotrigine XR 200mg  daily was continued and Brivact 100mg  BID added. Since,   Neurocognitive evaluation with Dr Kieth Brightly showed memory loss most consistent with history of TBI. No neurodegenerative dementia concerns noted. She has continued donepezil 5mg  BID.   10/06/2022 CD: Shannon Barry is a 72 y.o. female patient who is here for revisit 10/06/2022 for  follow up on Dr Shelva Majestic , seen 2 days ago, no report yet. She had an ER visit on 09-19-2022, 2 days after her sons wedding. Her family reported seizure like activity.  They are fairly sure that these were seizures, a total of 5 of them. The family found she had a UTi at the time was febrile. We have weaned her off Depakote and she is on Lamictal only. Her tremor is gone, her gait disorder improved.    She has been taking Aricept bid for GI tolerability, 5 mg. I now learnt she actually had 5 seizures associated with bowel incontinence. Has diarrhea  almost every day, and imodium  helped only temporarily.    I have no results form dr Clelia Croft testing yet.    02-17-2022 CD: I had just the pleasure of seeing Shannon Barry on 7-17 2023 and in the meantime as her daughter-in-law reports today she had at least 2 seizures.(!)   An MRI of the brain has been ordered for posttraumatic seizures that shows changes of previous right parietal craniotomy well-known cystic encephalomalacia and gliosis.  There is some for age advanced general cerebellar atrophy and cerebral atrophy but no acute abnormalities were noted the study was compared with a CT dated 03-12-2021.  All vessels appear flow patent.  The study was read by Dr. Lina Sayre.   The EEG showed a slowed background  rhythm between 7 and 8 Hz symmetrically -recording showed an  breach artifact localized  at the high right parietal area . The photic stimulation resulted in no photic entrainment the patient stayed awake for the whole recording.   This EEG was considered normal except for the breach artifact which is a scalp defect postoperatively. She had burr holes on both parietal areas  to relief blood. She has had a right sided compressed skull fracture.     Medication management: The patient did not bring her medication with her to this visit.    The patient reports she has taken lower doses of Depakote and has not yet started the increase in Lamictal.    We are weaning off Depakote and building up Lamictal to 100 mg bid . I will draw both blood level and liver function tests. Can go to Lamictal 100 mg bid.    12-15-2021 CD: I have the pleasure of seeing Shannon Barry today and meanwhile 72 year old Caucasian patient with a history of traumatic brain injury, induced coma, cognitive deficits, history of alcohol abuse in the past, and seizure disorder.  She had frequent falls when I initially encountered her in the year 2000 1617 and 18 but her seizures were well controlled.  Now she presents with a uptake in seizure frequency and also reports memory blips short parts of conversations that are essential to the message and not recalled and usually this conversation may have taken place within hours or  days before.  Her long-term memory is described as intact.  She recovered very well from her cognitive baseline when I initially encountered her.  Her neuropsychologist had predicted that she would need to live with assistance but she recovered her ability to do activities of daily living and she even lift for a.  Of time in an assisted living facility.  She has tolerated her antiepileptic medications very well she was on the valproic acid and lamotrigine but also the dose has not changed her response is no longer that of  controlled seizures.  She does present with fluent speech otherwise, no sign of aphasia.  She reports sometimes not recalling names, she may misplace items but her main concern is the inability to store information on a short-term basis.   I wonder if the 'blips' are seizure related?  She has no increase risk of falling.  No dizziness.    I will order a new neuro-psychological evaluation and EEG, and MOCA today. CMET needed.  She is 11 years sober now- no relapses.    07/20/2021 ALL: Shannon Barry returns for follow up for seizures. She was last seen 10/12 and reported questionable seizure activity. Event was unwitnessed and patient states she hit her head on fireplace. CT was negative. Labs showed sodium was 126. AED levels stable. She was advised to follow up with PCP regarding hyponatremia. She had a visit with [name], NP on 05/06/2021 to establish care as she had not been seen by previous PCP ([facility] [employer]) in over 2 years. Labs were repeated 05/06/2021. Sodium was 135. T3 was low and Synthroid dose increased. Since, she reports feeling better. Dizziness has significantly improved. No falls. No seizure like activity. She continues lamotrigine XR 100mg  daily and divalproex ER 500mg  daily. She is taking Synthroid first thing in the morning. She is able to manage her meds and perform ADLs independently. 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 She lives with her son, Shannon Barry, who presents with her today. He states she is "really good right now". She has follow up with PCP 07/28/2021.   03/10/2021 ALL:  Shannon Barry returns for follow up. She reports breakthrough seizure 03/06/2021. She reports waking up around 1am to use the restroom. She was returning to the bed and reports that she was saying "ok, ok, ok". She reports falling in her living room and hit her head on a brick hearth. She remembers "beating" her head several times on the edge of the fireplace. She went back to bed and did not seek medical attention. Event not  witnessed. She states that the next morning she told her son (who lives with her) that she had a seizure. Her daughter in law reports seeing her on 03/08/21 and noticed her left eye was black. She doesn't remember being dizzy or lightheaded. No incontinence or tongue injuries. She reports being more emotional the day of the seizure event as her son had gotten married. Daughter in law reports that she had called her several nights the week before stating that she was not able to sleep. Daughter in law reports she has seemed normal since. No unusual confusion or unusual behavior since. She denies headache. No asa or blood thinners. She reports normal appetite. She drinks about 2 glasses of water daily and several glasses of tea. She takes her levothyroxine every morning but does not take seizure medication at the same time every day. She manages her medication in pill organizer. She denies difficulty remembering to take medications. She does  not drive.  She reports dizziness with turning head for the past month. She is having more allergy symptoms and taking antihistamine.   09/15/2020 ALL: Shannon Barry returns for follow up for seizures. She continues lamotrigine and divalproex. She is tolerating medicaitons well. No obvious adverse effects. She is followed regularly by PCP. She plans to update Dexa this year. Memory is stable. She lives in a split level home with her youngest son. Her oldest son makes sure bills are paid but she is able to manage her money, go to the grocery store, etc. She performs ADL independently. She does not drive. Rarely mises doses of medication.   09/12/2019 ALL:  Shannon Barry is a 72 y.o. female here today for follow up for seizure. She continues lamotrigine ER 100mg  daily and divalproex ER 500mg  at bedtime. She is not sure but thinks she may have had a seizure. She reports defecating in her hallway at home. She does not remember the event. She just remembers waking up and seeing the a mess  in the hall and her undergarments were soiled. Event was not witnessed. She does endorse being dehydrated from a bout of diarrhea. No tongue injuries, no head injuries. No tenderness or weakness of extremities. She reports tonic clonic movements with typical seizures. No other seizure activity. She usually does not miss doses of AED's but admits that she has been out of her divalproex for about 4 days.   She was admitted to the hospital on 03/14/2019. Son had called our office two days prior with concerns of stroke and advised to take her to the ER. EMS was called to her home on 10/15 due to confusion, weakness, fever and cough. She was treated for CAP, Covid negative. She had significant deconditioning and was sent to rehab at [facility]. She was infected with COVID while in rehab. She never had any symptoms and recovered. She lives in a rental home with her son. She is very happy. She feels that memory is stable. She is able to dose her own medications and performs all ADL's independently. She does not drive.   HISTORY: (copied from my note on 07/12/2018)  Shannon Barry is a 72 y.o. female here today for follow up. She reports feeling well. She is taking lamotrigine 100mg  ER and divalproex 500mg  ER every night. She denies seizure activity. She is able to perform ADL's. She has not driven in 20+ years. She is currently living with her son who helps her with medications. She is feeling well today and without complaints.    HISTORY: (copied from [place] note on 10/31/2017)  Shannon Barry is a 72 year old female with a history of seizures.  She returns today for follow-up.  She is currently on Depakote taking 500 mg extended release as well as a Lamictal ER 100 mg daily.  She denies any seizure events.  She continues to live at [facility].  Reports that she is able to complete all ADLs independently.  She does not operate a motor vehicle.  Denies any changes in her gait or balance.  Denies any  falls.  She returns today for evaluation.   REVIEW OF SYSTEMS: Out of a complete 14 system review of symptoms, the patient complains only of the following symptoms, seizures, dizziness, and all other reviewed systems are negative.  ALLERGIES: Allergies  Allergen Reactions   Penicillins Hives    Has patient had a PCN reaction causing immediate rash, facial/tongue/throat swelling, SOB or lightheadedness with hypotension: No Has patient  had a PCN reaction causing severe rash involving mucus membranes or skin necrosis: Yes Has patient had a PCN reaction that required hospitalization No Has patient had a PCN reaction occurring within the last 10 years: Yes If all of the above answers are "NO", then may proceed with Cephalosporin use.    Sulfa Antibiotics Other (See Comments)    violently ill    HOME MEDICATIONS: Outpatient Medications Prior to Visit  Medication Sig Dispense Refill   azithromycin (ZITHROMAX) 250 MG tablet Take 1 tablet (250 mg total) by mouth daily. Take first 2 tablets together, then 1 every day until finished. 6 tablet 0   brivaracetam (BRIVIACT) 100 MG TABS tablet Take 1 tablet (100 mg total) by mouth 2 (two) times daily. 60 tablet 5   brivaracetam (BRIVIACT) 100 MG TABS tablet Take 1 tablet (100 mg total) by mouth 2 (two) times daily. 60 tablet 0   cephALEXin (KEFLEX) 500 MG capsule Take 1 capsule (500 mg total) by mouth 3 (three) times daily. 21 capsule 0   cetirizine (ZYRTEC ALLERGY) 10 MG tablet Take 1 tablet (10 mg total) by mouth daily. 90 tablet 1   desvenlafaxine (PRISTIQ) 50 MG 24 hr tablet TAKE 1 TABLET BY MOUTH EVERY DAY 90 tablet 1   donepezil (ARICEPT) 5 MG tablet Take 1 tablet (5 mg total) by mouth in the morning and at bedtime. 60 tablet 1   doxycycline (VIBRAMYCIN) 100 MG capsule Take 1 capsule (100 mg total) by mouth 2 (two) times daily. 20 capsule 0   lamoTRIgine (LAMICTAL XR) 100 MG 24 hour tablet TAKE 2 tablets a day po 180 tablet 3   levothyroxine  (SYNTHROID) 88 MCG tablet TAKE 1 TABLET BY MOUTH DAILY BEFORE BREAKFAST. 90 tablet 1   No facility-administered medications prior to visit.    PAST MEDICAL HISTORY: Past Medical History:  Diagnosis Date   Allergy    Depression    Seizures (HCC)    Thyroid disease    Traumatic brain injury (HCC)     PAST SURGICAL HISTORY: Past Surgical History:  Procedure Laterality Date   right hand surgey  Right    shoulder injury  Left    traumatic head injury  N/A    URETERAL REIMPLANTATION OF TRANSPLANTED KIDNEY N/A     FAMILY HISTORY: No family history on file.  SOCIAL HISTORY: Social History   Socioeconomic History   Marital status: Divorced    Spouse name: Not on file   Number of children: Not on file   Years of education: Not on file   Highest education level: Not on file  Occupational History   Not on file  Tobacco Use   Smoking status: Never   Smokeless tobacco: Never  Vaping Use   Vaping status: Never Used  Substance and Sexual Activity   Alcohol use: No    Alcohol/week: 0.0 standard drinks of alcohol   Drug use: No   Sexual activity: Yes  Other Topics Concern   Not on file  Social History Narrative   Huge [employer] fan.   She loves college football.    She has a dog named buddy.    Social Determinants of Health   Financial Resource Strain: Low Risk  (10/12/2022)   Overall Financial Resource Strain (CARDIA)    Difficulty of Paying Living Expenses: Not hard at all  Food Insecurity: No Food Insecurity (10/12/2022)   Hunger Vital Sign    Worried About Running Out of Food in the Last Year: Never true  Ran Out of Food in the Last Year: Never true  Transportation Needs: No Transportation Needs (10/12/2022)   PRAPARE - Administrator, Civil Service (Medical): No    Lack of Transportation (Non-Medical): No  Physical Activity: Insufficiently Active (10/12/2022)   Exercise Vital Sign    Days of Exercise per Week: 7 days    Minutes of Exercise per Session:  20 min  Stress: No Stress Concern Present (10/12/2022)   [employer] of Occupational Health - Occupational Stress Questionnaire    Feeling of Stress : Not at all  Social Connections: Moderately Integrated (10/05/2021)   Social Connection and Isolation Panel [NHANES]    Frequency of Communication with Friends and Family: More than three times a week    Frequency of Social Gatherings with Friends and Family: Not on file    Attends Religious Services: 1 to 4 times per year    Active Member of [employer] or Organizations: Yes    Attends Banker Meetings: More than 4 times per year    Marital Status: Divorced  Intimate Partner Violence: Not At Risk (10/05/2021)   Humiliation, Afraid, Rape, and Kick questionnaire    Fear of Current or Ex-Partner: No    Emotionally Abused: No    Physically Abused: No    Sexually Abused: No      PHYSICAL EXAM  There were no vitals filed for this visit.    There is no height or weight on file to calculate BMI.  Generalized: Well developed, in no acute distress  Cardiology: normal rate and rhythm, no murmur noted Respiratory: clear to auscultation bilaterally  Neurological examination  Mentation: Alert oriented to time, place, history taking. Follows all commands speech and language fluent Cranial nerve II-XII: Pupils were equal round reactive to light. Extraocular movements were full, visual field were full on confrontational test. Facial sensation and strength were normal. Uvula tongue midline. Head turning and shoulder shrug  were normal and symmetric. Motor: The motor testing reveals 5 over 5 strength of all 4 extremities. Good symmetric motor tone is noted throughout.  Sensory: Sensory testing is intact to soft touch on all 4 extremities. No evidence of extinction is noted.  Coordination: Cerebellar testing reveals good finger-nose-finger and heel-to-shin bilaterally.  Gait and station: Gait is mildly wide but stable, no assistive  device Reflexes: Deep tendon reflexes are symmetric and normal bilaterally.   DIAGNOSTIC DATA (LABS, IMAGING, TESTING) - I reviewed patient records, labs, notes, testing and imaging myself where available.     12/13/2021    2:55 PM 10/31/2017    3:11 PM 08/03/2016   10:45 AM  MMSE - Mini Mental State Exam  Orientation to time 4 5 4   Orientation to Place 5 5 5   Registration 3 3 3   Attention/ Calculation 4 4 3   Recall 1 3 1   Language- name 2 objects 2 2 2   Language- repeat 1 1 1   Language- follow 3 step command 3 3 3   Language- read & follow direction 1 1 1   Write a sentence 1 1 1   Copy design 0 1 0  Total score 25 29 24      Lab Results  Component Value Date   WBC 6.1 11/05/2022   HGB 14.3 11/05/2022   HCT 43.4 11/05/2022   MCV 94.1 11/05/2022   PLT 274 11/05/2022      Component Value Date/Time   NA 134 (L) 11/05/2022 1421   NA 133 (L) 06/29/2022 1152   K 4.6  11/05/2022 1421   CL 98 11/05/2022 1421   CO2 27 11/05/2022 1421   GLUCOSE 126 (H) 11/05/2022 1421   BUN 15 11/05/2022 1421   BUN 15 06/29/2022 1152   CREATININE 0.68 11/05/2022 1421   CALCIUM 9.2 11/05/2022 1421   PROT 7.1 09/19/2022 2247   PROT 6.7 06/29/2022 1152   ALBUMIN 4.0 09/19/2022 2247   ALBUMIN 4.3 06/29/2022 1152   AST 29 09/19/2022 2247   ALT 23 09/19/2022 2247   ALKPHOS 66 09/19/2022 2247   BILITOT 0.4 09/19/2022 2247   BILITOT 0.4 06/29/2022 1152   GFRNONAA >60 11/05/2022 1421   GFRAA >60 03/16/2019 0808   No results found for: "CHOL", "HDL", "LDLCALC", "LDLDIRECT", "TRIG", "CHOLHDL" No results found for: "HGBA1C" Lab Results  Component Value Date   VITAMINB12 242 03/15/2019   Lab Results  Component Value Date   TSH 5.414 (H) 09/19/2022       ASSESSMENT AND PLAN 72 y.o. year old female  has a past medical history of Allergy, Depression, Seizures (HCC), Thyroid disease, and Traumatic brain injury (HCC). 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 Dizziness has improved. No further falls or concerns of seizure activity. We will continue lamotrigine ER 100mg  daily and divalproex ER 500mg  daily. Labs were stable at last visit. Memory seems stable. I have asked her family to ensure she is taking medications consistently at same time every day. She will continue close follow up with PCP for co morbidity management. She was encouraged to stay well hydrated and work on healthy lifestyle habits. She will follow up in 1 year, sooner if needed.    No orders of the defined types were placed in this encounter.     No orders of the defined types were placed in this encounter.      Shawnie Dapper, FNP-C 01/23/2023, 5:43 PM [facility] [address], [address] [place], [place] [address] [phone]

## 2023-01-23 NOTE — Patient Instructions (Signed)
Below is our plan:  We will continue lamotrigine XR 200mg  daily and Briviact 100mg  twice daily.   We will discontinue donepezil. Take 1 tablet every other day for 1 week then discontinue.   Please make sure you are consistent with timing of seizure medication. I recommend annual visit with primary care provider (PCP) for complete physical and routine blood work. I recommend daily intake of vitamin D (400-800iu) and calcium (800-1000mg ) for bone health. Discuss Dexa screening with PCP.   According to [place] law, you can not drive unless you are seizure / syncope free for at least 6 months and under physician's care.  Please maintain precautions. Do not participate in activities where a loss of awareness could harm you or someone else. No swimming alone, no tub bathing, no hot tubs, no driving, no operating motorized vehicles (cars, ATVs, motocycles, etc), lawnmowers, power tools or firearms. No standing at heights, such as rooftops, ladders or stairs. Avoid hot objects such as stoves, heaters, open fires. Wear a helmet when riding a bicycle, scooter, skateboard, etc. and avoid areas of traffic. Set your water heater to 120 degrees or less.  SUDEP is the sudden, unexpected death of someone with epilepsy, who was otherwise healthy. In SUDEP cases, no other cause of death is found when an autopsy is done. Each year, more than 1 in 1,000 people with epilepsy die from SUDEP. This is the leading cause of death in people with uncontrolled seizures. Until further answers are available, the best way to prevent SUDEP is to lower your risk by controlling seizures. Research has found that people with all types of epilepsy that experience convulsive seizures can be at risk.   Please make sure you are staying well hydrated. I recommend 50-60 ounces daily. Well balanced diet and regular exercise encouraged. Consistent sleep schedule with 6-8 hours recommended.   Please continue follow up with care team as directed.    Follow up with Dr Vickey Huger in 6 months   You may receive a survey regarding today's visit. I encourage you to leave honest feed back as I do use this information to improve patient care. Thank you for seeing me today!   Management of Memory Problems   There are some general things you can do to help manage your memory problems.  Your memory may not in fact recover, but by using techniques and strategies you will be able to manage your memory difficulties better.   1)  Establish a routine. Try to establish and then stick to a regular routine.  By doing this, you will get used to what to expect and you will reduce the need to rely on your memory.  Also, try to do things at the same time of day, such as taking your medication or checking your calendar first thing in the morning. Think about think that you can do as a part of a regular routine and make a list.  Then enter them into a daily planner to remind you.  This will help you establish a routine.   2)  Organize your environment. Organize your environment so that it is uncluttered.  Decrease visual stimulation.  Place everyday items such as keys or cell phone in the same place every day (ie.  Basket next to front door) Use post it notes with a brief message to yourself (ie. Turn off light, lock the door) Use labels to indicate where things go (ie. Which cupboards are for food, dishes, etc.) Keep a notepad and pen  by the telephone to take messages   3)  Memory Aids A diary or journal/notebook/daily planner Making a list (shopping list, chore list, to do list that needs to be done) Using an alarm as a reminder (kitchen timer or cell phone alarm) Using cell phone to store information (Notes, Calendar, Reminders) Calendar/White board placed in a prominent position Post-it notes   In order for memory aids to be useful, you need to have good habits.  It's no good remembering to make a note in your journal if you don't remember to look in it.   Try setting aside a certain time of day to look in journal.   4)  Improving mood and managing fatigue. There may be other factors that contribute to memory difficulties.  Factors, such as anxiety, depression and tiredness can affect memory. Regular gentle exercise can help improve your mood and give you more energy. Exercise: there are short videos created by the [employer] on Health specially for older adults: https://bit.ly/2I30q97.  Mediterranean diet: which emphasizes fruits, vegetables, whole grains, legumes, fish, and other seafood; unsaturated fats such as olive oils; and low amounts of red meat, eggs, and sweets. A variation of this, called MIND (Mediterranean-DASH Intervention for Neurodegenerative Delay) incorporates the DASH (Dietary Approaches to Stop Hypertension) diet, which has been shown to lower high blood pressure, a risk factor for Alzheimer's disease. More information at: ExitMarketing.de.  Aerobic exercise that improve heart health is also good for the mind.  [employer] on Aging have short videos for exercises that you can do at home: BlindWorkshop.com.pt Simple relaxation techniques may help relieve symptoms of anxiety Try to get back to completing activities or hobbies you enjoyed doing in the past. Learn to pace yourself through activities to decrease fatigue. Find out about some local support groups where you can share experiences with others. Try and achieve 7-8 hours of sleep at night.   Tasks to improve attention/working memory 1. Good sleep hygiene (7-8 hrs of sleep) 2. Learning a new skill (Painting, Carpentry, Pottery, new language, Knitting). 3.Cognitive exercises (keep a daily journal, Puzzles) 4. Physical exercise and training  (30 min/day X 4 days week) 5. Being on Antidepressant if needed 6.Yoga, Meditation, Tai Chi 7. Decrease alcohol intake 8.Have a clear  schedule and structure in daily routine   MIND Diet: The Mediterranean-DASH Diet Intervention for Neurodegenerative Delay, or MIND diet, targets the health of the aging brain. Research participants with the highest MIND diet scores had a significantly slower rate of cognitive decline compared with those with the lowest scores. The effects of the MIND diet on cognition showed greater effects than either the Mediterranean or the DASH diet alone.   The healthy items the MIND diet guidelines suggest include:   3+ servings a day of whole grains 1+ servings a day of vegetables (other than green leafy) 6+ servings a week of green leafy vegetables 5+ servings a week of nuts 4+ meals a week of beans 2+ servings a week of berries 2+ meals a week of poultry 1+ meals a week of fish Mainly olive oil if added fat is used   The unhealthy items, which are higher in saturated and trans fat, include: Less than 5 servings a week of pastries and sweets Less than 4 servings a week of red meat (including beef, pork, lamb, and products made from these meats) Less than one serving a week of cheese and fried foods Less than 1 tablespoon a day of butter/stick margarine

## 2023-01-24 ENCOUNTER — Ambulatory Visit: Payer: Medicare HMO | Admitting: Family Medicine

## 2023-01-24 ENCOUNTER — Encounter: Payer: Self-pay | Admitting: Family Medicine

## 2023-01-24 VITALS — BP 111/66 | HR 78 | Ht 63.0 in | Wt 124.0 lb

## 2023-01-24 DIAGNOSIS — R561 Post traumatic seizures: Secondary | ICD-10-CM | POA: Diagnosis not present

## 2023-01-24 DIAGNOSIS — S069X5S Unspecified intracranial injury with loss of consciousness greater than 24 hours with return to pre-existing conscious level, sequela: Secondary | ICD-10-CM

## 2023-01-24 DIAGNOSIS — G3184 Mild cognitive impairment, so stated: Secondary | ICD-10-CM | POA: Diagnosis not present

## 2023-01-24 MED ORDER — BRIVARACETAM 100 MG PO TABS: 100.0000 mg | ORAL_TABLET | Freq: Two times a day (BID) | ORAL | 5 refills | Status: DC

## 2023-01-31 ENCOUNTER — Other Ambulatory Visit: Payer: Self-pay | Admitting: Neurology

## 2023-01-31 ENCOUNTER — Ambulatory Visit: Payer: Medicare HMO | Admitting: Family Medicine

## 2023-03-30 ENCOUNTER — Other Ambulatory Visit: Payer: Self-pay | Admitting: Nurse Practitioner

## 2023-03-30 DIAGNOSIS — E039 Hypothyroidism, unspecified: Secondary | ICD-10-CM

## 2023-04-11 NOTE — Progress Notes (Unsigned)
Madelaine Bhat, CMA,acting as a Neurosurgeon for [name], FNP.,have documented all relevant documentation on the behalf of [name], FNP,as directed by  [name], FNP while in the presence of [name], FNP.  Subjective:  Patient ID: Shannon Barry , female    DOB: 1951-03-25 , 72 y.o.   MRN: [id number]  No chief complaint on file.   HPI  Patient presents today for a thyroid follow up, Patient reports compliance with medication. Patient denies any chest pain, SOB, or headaches. Patient has no concerns today.     Past Medical History:  Diagnosis Date  . Allergy   . Depression   . Seizures (HCC)   . Thyroid disease   . Traumatic brain injury [facility])      No family history on file.   Current Outpatient Medications:  .  brivaracetam (BRIVIACT) 100 MG TABS tablet, Take 1 tablet (100 mg total) by mouth 2 (two) times daily., Disp: 60 tablet, Rfl: 5 .  cetirizine (ZYRTEC ALLERGY) 10 MG tablet, Take 1 tablet (10 mg total) by mouth daily., Disp: 90 tablet, Rfl: 1 .  desvenlafaxine (PRISTIQ) 50 MG 24 hr tablet, TAKE 1 TABLET BY MOUTH EVERY DAY, Disp: 90 tablet, Rfl: 1 .  lamoTRIgine (LAMICTAL XR) 100 MG 24 hour tablet, TAKE 2 tablets a day po, Disp: 180 tablet, Rfl: 3 .  levothyroxine (SYNTHROID) 88 MCG tablet, TAKE 1 TABLET BY MOUTH EVERY DAY BEFORE BREAKFAST, Disp: 90 tablet, Rfl: 1   Allergies  Allergen Reactions  . Penicillins Hives    Has patient had a PCN reaction causing immediate rash, facial/tongue/throat swelling, SOB or lightheadedness with hypotension: No Has patient had a PCN reaction causing severe rash involving mucus membranes or skin necrosis: Yes Has patient had a PCN reaction that required hospitalization No Has patient had a PCN reaction occurring within the last 10 years: Yes If all of the above answers are "NO", then may proceed with Cephalosporin use.   . Sulfa Antibiotics Other (See Comments)    violently ill     Review of Systems  Constitutional:  Negative.   HENT: Negative.    Eyes: Negative.   Respiratory: Negative.    Cardiovascular: Negative.   Gastrointestinal: Negative.     There were no vitals filed for this visit. There is no height or weight on file to calculate BMI.  Wt Readings from Last 3 Encounters:  01/24/23 124 lb (56.2 kg)  12/13/22 118 lb (53.5 kg)  11/05/22 118 lb (53.5 kg)    The ASCVD Risk score (Arnett DK, et al., 2019) failed to calculate for the following reasons:   Cannot find a previous HDL lab   Cannot find a previous total cholesterol lab  Objective:  Physical Exam      Assessment And Plan:  Acquired hypothyroidism    No follow-ups on file.  Patient was given opportunity to ask questions. Patient verbalized understanding of the plan and was able to repeat key elements of the plan. All questions were answered to their satisfaction.    Jeanell Sparrow, FNP, have reviewed all documentation for this visit. The documentation on 04/11/23 for the exam, diagnosis, procedures, and orders are all accurate and complete.   IF YOU HAVE BEEN REFERRED TO A SPECIALIST, IT MAY TAKE 1-2 WEEKS TO SCHEDULE/PROCESS THE REFERRAL. IF YOU HAVE NOT HEARD FROM US/SPECIALIST IN TWO WEEKS, PLEASE GIVE [place] A CALL AT [phone].

## 2023-04-12 ENCOUNTER — Ambulatory Visit (INDEPENDENT_AMBULATORY_CARE_PROVIDER_SITE_OTHER): Payer: Self-pay | Admitting: Nurse Practitioner

## 2023-04-12 ENCOUNTER — Encounter: Payer: Self-pay | Admitting: Nurse Practitioner

## 2023-04-12 VITALS — BP 122/70 | HR 72 | Temp 98.4°F | Ht 63.0 in | Wt 128.8 lb

## 2023-04-12 DIAGNOSIS — E039 Hypothyroidism, unspecified: Secondary | ICD-10-CM | POA: Diagnosis not present

## 2023-04-12 DIAGNOSIS — F3160 Bipolar disorder, current episode mixed, unspecified: Secondary | ICD-10-CM | POA: Diagnosis not present

## 2023-04-12 DIAGNOSIS — L989 Disorder of the skin and subcutaneous tissue, unspecified: Secondary | ICD-10-CM | POA: Insufficient documentation

## 2023-04-12 DIAGNOSIS — R569 Unspecified convulsions: Secondary | ICD-10-CM | POA: Diagnosis not present

## 2023-04-12 DIAGNOSIS — F0390 Unspecified dementia without behavioral disturbance: Secondary | ICD-10-CM

## 2023-04-12 DIAGNOSIS — Z23 Encounter for immunization: Secondary | ICD-10-CM | POA: Diagnosis not present

## 2023-04-12 DIAGNOSIS — Z13220 Encounter for screening for lipoid disorders: Secondary | ICD-10-CM

## 2023-04-12 NOTE — Assessment & Plan Note (Signed)
Thyroid levels are normal. Continue current medications and will make changes as necessary

## 2023-04-12 NOTE — Assessment & Plan Note (Signed)
Most recent seizure was 2 months ago, she is now on a new medication

## 2023-04-12 NOTE — Patient Instructions (Signed)
Call Dr. Loreta Ave for your Colonoscopy [phone]

## 2023-04-12 NOTE — Assessment & Plan Note (Signed)
Stable,

## 2023-04-12 NOTE — Assessment & Plan Note (Signed)
Covid 19 vaccine given in office observed for 15 minutes without any adverse reaction  

## 2023-04-12 NOTE — Assessment & Plan Note (Signed)
She has multiple areas with hyperpigmented flat macules on right upper arm with some areas of scaly skin. Will refer to Dermatology for skin surveillance due to fair skin

## 2023-04-12 NOTE — Assessment & Plan Note (Signed)
Influenza vaccine administered Encouraged to take Tylenol as needed for fever or muscle aches.

## 2023-04-13 LAB — BMP8+EGFR
BUN/Creatinine Ratio: 36 — ABNORMAL HIGH (ref 12–28)
BUN: 21 mg/dL (ref 8–27)
CO2: 22 mmol/L (ref 20–29)
Calcium: 9.7 mg/dL (ref 8.7–10.3)
Chloride: 99 mmol/L (ref 96–106)
Creatinine, Ser: 0.59 mg/dL (ref 0.57–1.00)
Glucose: 79 mg/dL (ref 70–99)
Potassium: 4.6 mmol/L (ref 3.5–5.2)
Sodium: 139 mmol/L (ref 134–144)
eGFR: 97 mL/min/{1.73_m2} (ref >59)

## 2023-04-13 LAB — TSH+FREE T4
Free T4: 1.28 ng/dL (ref 0.82–1.77)
TSH: 1.4 u[IU]/mL (ref 0.450–4.500)

## 2023-04-13 LAB — LIPID PANEL
Chol/HDL Ratio: 2.1 ratio (ref 0.0–4.4)
Cholesterol, Total: 224 mg/dL — ABNORMAL HIGH (ref 100–199)
HDL: 109 mg/dL (ref >39)
LDL Chol Calc (NIH): 101 mg/dL — ABNORMAL HIGH (ref 0–99)
Triglycerides: 80 mg/dL (ref 0–149)
VLDL Cholesterol Cal: 14 mg/dL (ref 5–40)

## 2023-04-28 ENCOUNTER — Other Ambulatory Visit: Payer: Self-pay | Admitting: Neurology

## 2023-06-07 ENCOUNTER — Ambulatory Visit
Admission: RE | Admit: 2023-06-07 | Discharge: 2023-06-07 | Disposition: A | Discharge status: Home / Self Care | Payer: Medicare HMO | Source: Ambulatory Visit | Attending: Nurse Practitioner | Admitting: Nurse Practitioner

## 2023-06-07 DIAGNOSIS — N958 Other specified menopausal and perimenopausal disorders: Secondary | ICD-10-CM | POA: Diagnosis not present

## 2023-06-07 DIAGNOSIS — E2839 Other primary ovarian failure: Secondary | ICD-10-CM

## 2023-06-07 DIAGNOSIS — M8588 Other specified disorders of bone density and structure, other site: Secondary | ICD-10-CM | POA: Diagnosis not present

## 2023-06-20 ENCOUNTER — Other Ambulatory Visit: Payer: Self-pay | Admitting: Neurology

## 2023-06-21 NOTE — Telephone Encounter (Addendum)
Amy note on 01/24/23 pt was to wan on donepezil and stop.  Pt saw her PCP [name] NP on 04/12/23 and noted " She has now started back with the Donepezil because when she stopped taking she noticed her mind was all over the place. She will notify Neurology she has restarted."   I don't see a message that patient called to discuss restarting Rx.  Left message for patient to call to discuss.

## 2023-06-27 ENCOUNTER — Ambulatory Visit: Payer: Medicare HMO

## 2023-06-28 ENCOUNTER — Ambulatory Visit
Admission: RE | Admit: 2023-06-28 | Discharge: 2023-06-28 | Disposition: A | Discharge status: Home / Self Care | Payer: Medicare HMO | Source: Ambulatory Visit | Attending: Nurse Practitioner | Admitting: Nurse Practitioner

## 2023-06-28 DIAGNOSIS — Z1231 Encounter for screening mammogram for malignant neoplasm of breast: Secondary | ICD-10-CM

## 2023-07-03 ENCOUNTER — Other Ambulatory Visit: Payer: Self-pay | Admitting: Nurse Practitioner

## 2023-07-03 DIAGNOSIS — R928 Other abnormal and inconclusive findings on diagnostic imaging of breast: Secondary | ICD-10-CM

## 2023-07-10 ENCOUNTER — Encounter: Payer: Self-pay | Admitting: Nurse Practitioner

## 2023-07-10 ENCOUNTER — Ambulatory Visit: Payer: Medicare HMO | Admitting: Nurse Practitioner

## 2023-07-10 VITALS — BP 130/70 | HR 75 | Temp 98.8°F | Ht 63.0 in | Wt 131.2 lb

## 2023-07-10 DIAGNOSIS — E039 Hypothyroidism, unspecified: Secondary | ICD-10-CM | POA: Diagnosis not present

## 2023-07-10 DIAGNOSIS — F3160 Bipolar disorder, current episode mixed, unspecified: Secondary | ICD-10-CM

## 2023-07-10 DIAGNOSIS — R569 Unspecified convulsions: Secondary | ICD-10-CM | POA: Diagnosis not present

## 2023-07-10 DIAGNOSIS — E7800 Pure hypercholesterolemia, unspecified: Secondary | ICD-10-CM

## 2023-07-10 DIAGNOSIS — Z87820 Personal history of traumatic brain injury: Secondary | ICD-10-CM

## 2023-07-10 DIAGNOSIS — Z79899 Other long term (current) drug therapy: Secondary | ICD-10-CM | POA: Diagnosis not present

## 2023-07-10 DIAGNOSIS — Z2821 Immunization not carried out because of patient refusal: Secondary | ICD-10-CM

## 2023-07-10 DIAGNOSIS — F3341 Major depressive disorder, recurrent, in partial remission: Secondary | ICD-10-CM

## 2023-07-10 DIAGNOSIS — Z0000 Encounter for general adult medical examination without abnormal findings: Secondary | ICD-10-CM | POA: Diagnosis not present

## 2023-07-10 DIAGNOSIS — F0390 Unspecified dementia without behavioral disturbance: Secondary | ICD-10-CM | POA: Diagnosis not present

## 2023-07-10 MED ORDER — DESVENLAFAXINE SUCCINATE ER 50 MG PO TB24: 50.0000 mg | ORAL_TABLET | Freq: Every day | ORAL | 1 refills | Status: DC

## 2023-07-10 MED ORDER — LEVOTHYROXINE SODIUM 88 MCG PO TABS: 88.0000 ug | ORAL_TABLET | Freq: Every day | ORAL | 1 refills | Status: DC

## 2023-07-10 NOTE — Progress Notes (Signed)
 Madelaine Bhat, CMA,acting as a Neurosurgeon for [name], FNP.,have documented all relevant documentation on the behalf of [name], FNP,as directed by  [name], FNP while in the presence of [name], FNP.  Subjective:    Patient ID: Shannon Barry , female    DOB: December 06, 1950 , 73 y.o.   MRN: [id number]  Chief Complaint  Patient presents with   Annual Exam    HPI  Patient presents today for HM, Patient reports compliance with medication. Patient denies any chest pain, SOB, or headaches. Patient reports she she has a "buzzing" feeling in her body. Her next appt with Neurology is March 3rd.  She drinks caffeinated coffee/espresso 3 times a day.   Patient is currently with her daughter in law Optician, dispensing      Past Medical History:  Diagnosis Date   Allergy    Depression    Seizures (HCC)    Thyroid disease    Traumatic brain injury [facility])      History reviewed. No pertinent family history.   Current Outpatient Medications:    brivaracetam (BRIVIACT) 100 MG TABS tablet, Take 1 tablet (100 mg total) by mouth 2 (two) times daily., Disp: 60 tablet, Rfl: 5   cetirizine (ZYRTEC ALLERGY) 10 MG tablet, Take 1 tablet (10 mg total) by mouth daily., Disp: 90 tablet, Rfl: 1   donepezil (ARICEPT) 5 MG tablet, TAKE 1 TABLET (5 MG TOTAL) BY MOUTH IN THE MORNING AND AT BEDTIME, Disp: 60 tablet, Rfl: 1   lamoTRIgine (LAMICTAL XR) 100 MG 24 hour tablet, TAKE 2 tablets a day po, Disp: 180 tablet, Rfl: 3   desvenlafaxine (PRISTIQ) 50 MG 24 hr tablet, Take 1 tablet (50 mg total) by mouth daily., Disp: 90 tablet, Rfl: 1   levothyroxine (SYNTHROID) 88 MCG tablet, Take 1 tablet (88 mcg total) by mouth daily before breakfast., Disp: 90 tablet, Rfl: 1   Allergies  Allergen Reactions   Penicillins Hives    Has patient had a PCN reaction causing immediate rash, facial/tongue/throat swelling, SOB or lightheadedness with hypotension: No Has patient had a PCN reaction causing severe rash involving  mucus membranes or skin necrosis: Yes Has patient had a PCN reaction that required hospitalization No Has patient had a PCN reaction occurring within the last 10 years: Yes If all of the above answers are "NO", then may proceed with Cephalosporin use.    Sulfa Antibiotics Other (See Comments)    violently ill      The patient states she uses post menopausal status for birth control. No LMP recorded. Patient is postmenopausal.. Negative for Dysmenorrhea and Negative for Menorrhagia. Negative for: breast discharge, breast lump(s), breast pain and breast self exam. Associated symptoms include abnormal vaginal bleeding. Pertinent negatives include abnormal bleeding (hematology), anxiety, decreased libido, depression, difficulty falling sleep, dyspareunia, history of infertility, nocturia, sexual dysfunction, sleep disturbances, urinary incontinence, urinary urgency, vaginal discharge and vaginal itching. Diet vegetarian does eat fish and eggs. Does not eat meat. The patient states her exercise level is moderate she walks about one mile a day and sometimes a little more.   The patient's tobacco use is:  Social History   Tobacco Use  Smoking Status Never  Smokeless Tobacco Never   She has been exposed to passive smoke. The patient's alcohol use is:  Social History   Substance and Sexual Activity  Alcohol Use No   Alcohol/week: 0.0 standard drinks of alcohol    Review of Systems  Constitutional: Negative.   HENT:  Negative.    Eyes: Negative.   Respiratory: Negative.    Cardiovascular: Negative.   Gastrointestinal: Negative.   Endocrine: Negative.   Genitourinary: Negative.   Allergic/Immunologic: Negative.   Neurological: Negative.   Psychiatric/Behavioral: Negative.       Today's Vitals   07/10/23 1416  BP: 130/70  Pulse: 75  Temp: 98.8 F (37.1 C)  Weight: 131 lb 3.2 oz (59.5 kg)  Height: 5\' 3"  (1.6 m)  PainSc: 0-No pain   Body mass index is 23.24 kg/m.  Wt Readings  from Last 3 Encounters:  07/10/23 131 lb 3.2 oz (59.5 kg)  04/12/23 128 lb 12.8 oz (58.4 kg)  01/24/23 124 lb (56.2 kg)     Objective:  Physical Exam Vitals reviewed.  Constitutional:      General: She is not in acute distress.    Appearance: Normal appearance. She is well-developed.  HENT:     Head: Normocephalic and atraumatic.     Right Ear: Tympanic membrane, ear canal and external ear normal. There is no impacted cerumen.     Left Ear: Tympanic membrane, ear canal and external ear normal. There is no impacted cerumen.     Nose: Nose normal.     Mouth/Throat:     Mouth: Mucous membranes are moist.  Eyes:     Pupils: Pupils are equal, round, and reactive to light.  Cardiovascular:     Rate and Rhythm: Normal rate and regular rhythm.     Pulses: Normal pulses.     Heart sounds: Normal heart sounds. No murmur heard. Pulmonary:     Effort: Pulmonary effort is normal. No respiratory distress.     Breath sounds: Normal breath sounds. No wheezing.  Abdominal:     General: Abdomen is flat.     Palpations: Abdomen is soft.  Musculoskeletal:        General: No swelling or tenderness. Normal range of motion.  Skin:    General: Skin is warm and dry.     Capillary Refill: Capillary refill takes less than 2 seconds.  Neurological:     General: No focal deficit present.     Mental Status: She is alert and oriented to person, place, and time.     Cranial Nerves: No cranial nerve deficit.  Psychiatric:        Mood and Affect: Mood normal.        Behavior: Behavior normal.        Thought Content: Thought content normal.        Judgment: Judgment normal.         Assessment And Plan:     Encounter for annual health examination Assessment & Plan: Behavior modifications discussed and diet history reviewed.   Pt will continue to exercise regularly and modify diet with low GI, plant based foods and decrease intake of processed foods.  Recommend intake of daily multivitamin, Vitamin  D, and calcium.  Recommend mammogram and colonoscopy for preventive screenings, as well as recommend immunizations that include influenza, TDAP, and Shingles    Acquired hypothyroidism Assessment & Plan: Thyroid levels are normal. Continue current medications and will make changes as necessary  Orders: -     CMP14+EGFR -     TSH + free T4 -     Levothyroxine Sodium; Take 1 tablet (88 mcg total) by mouth daily before breakfast.  Dispense: 90 tablet; Refill: 1  Elevated cholesterol Assessment & Plan: Cholesterol levels are stable. Continue focusing on low fat diet.   Orders: -  Lipid panel  Acquired hypothyroidism Assessment & Plan: Thyroid levels are normal. Continue current medications and will make changes as necessary  Orders: -     CMP14+EGFR -     TSH + free T4 -     Levothyroxine Sodium; Take 1 tablet (88 mcg total) by mouth daily before breakfast.  Dispense: 90 tablet; Refill: 1  Mixed bipolar I disorder (HCC) Assessment & Plan: Stable,    Seizures (HCC) Assessment & Plan: No recent seizures, continue f/u with Neurologist.    Recurrent major depressive disorder, in partial remission (HCC) -     Desvenlafaxine Succinate ER; Take 1 tablet (50 mg total) by mouth daily.  Dispense: 90 tablet; Refill: 1  COVID-19 vaccination declined Assessment & Plan: Declines covid 19 vaccine. Discussed risk of covid 14 and if she changes her mind about the vaccine to call the office. Education has been provided regarding the importance of this vaccine but patient still declined. Advised may receive this vaccine at local pharmacy or Health Dept.or vaccine clinic. Aware to provide a copy of the vaccination record if obtained from local pharmacy or Health Dept.  Encouraged to take multivitamin, vitamin d, vitamin c and zinc to increase immune system. Aware can call office if would like to have vaccine here at office. Verbalized acceptance and understanding.    Other long term  (current) drug therapy -     CBC with Differential/Platelet  History of traumatic brain injury  Dementia without behavioral disturbance [facility]) Assessment & Plan: Continue f/u with Neurology and continue current medications    Return for 6 month thyroid check, 1 year physical. Patient was given opportunity to ask questions. Patient verbalized understanding of the plan and was able to repeat key elements of the plan. All questions were answered to their satisfaction.   [name], FNP   I, [name], FNP, have reviewed all documentation for this visit. The documentation on 07/10/23 for the exam, diagnosis, procedures, and orders are all accurate and complete.

## 2023-07-11 LAB — CBC WITH DIFFERENTIAL/PLATELET
Basophils Absolute: 0 10*3/uL (ref 0.0–0.2)
Basos: 1 %
EOS (ABSOLUTE): 0 10*3/uL (ref 0.0–0.4)
Eos: 1 %
Hematocrit: 44.1 % (ref 34.0–46.6)
Hemoglobin: 14.6 g/dL (ref 11.1–15.9)
Immature Grans (Abs): 0 10*3/uL (ref 0.0–0.1)
Immature Granulocytes: 0 %
Lymphocytes Absolute: 1.3 10*3/uL (ref 0.7–3.1)
Lymphs: 22 %
MCH: 31.1 pg (ref 26.6–33.0)
MCHC: 33.1 g/dL (ref 31.5–35.7)
MCV: 94 fL (ref 79–97)
Monocytes Absolute: 0.5 10*3/uL (ref 0.1–0.9)
Monocytes: 9 %
Neutrophils Absolute: 4.1 10*3/uL (ref 1.4–7.0)
Neutrophils: 67 %
Platelets: 265 10*3/uL (ref 150–450)
RBC: 4.7 x10E6/uL (ref 3.77–5.28)
RDW: 12.3 % (ref 11.7–15.4)
WBC: 6 10*3/uL (ref 3.4–10.8)

## 2023-07-11 LAB — CMP14+EGFR
ALT: 19 [IU]/L (ref 0–32)
AST: 22 [IU]/L (ref 0–40)
Albumin: 4.5 g/dL (ref 3.8–4.8)
Alkaline Phosphatase: 87 [IU]/L (ref 44–121)
BUN/Creatinine Ratio: 23 (ref 12–28)
BUN: 15 mg/dL (ref 8–27)
Bilirubin Total: <0.2 mg/dL (ref 0.0–1.2)
CO2: 24 mmol/L (ref 20–29)
Calcium: 10 mg/dL (ref 8.7–10.3)
Chloride: 99 mmol/L (ref 96–106)
Creatinine, Ser: 0.64 mg/dL (ref 0.57–1.00)
Globulin, Total: 2.9 g/dL (ref 1.5–4.5)
Glucose: 83 mg/dL (ref 70–99)
Potassium: 4.5 mmol/L (ref 3.5–5.2)
Sodium: 141 mmol/L (ref 134–144)
Total Protein: 7.4 g/dL (ref 6.0–8.5)
eGFR: 94 mL/min/{1.73_m2} (ref >59)

## 2023-07-11 LAB — TSH+FREE T4
Free T4: 1.2 ng/dL (ref 0.82–1.77)
TSH: 2.75 u[IU]/mL (ref 0.450–4.500)

## 2023-07-11 LAB — LIPID PANEL
Chol/HDL Ratio: 2.1 {ratio} (ref 0.0–4.4)
Cholesterol, Total: 238 mg/dL — ABNORMAL HIGH (ref 100–199)
HDL: 116 mg/dL (ref >39)
LDL Chol Calc (NIH): 105 mg/dL — ABNORMAL HIGH (ref 0–99)
Triglycerides: 105 mg/dL (ref 0–149)
VLDL Cholesterol Cal: 17 mg/dL (ref 5–40)

## 2023-07-12 ENCOUNTER — Encounter: Payer: Self-pay | Admitting: Nurse Practitioner

## 2023-07-13 ENCOUNTER — Ambulatory Visit
Admission: RE | Admit: 2023-07-13 | Discharge: 2023-07-13 | Disposition: A | Discharge status: Home / Self Care | Payer: Medicare HMO | Source: Ambulatory Visit | Attending: Nurse Practitioner | Admitting: Nurse Practitioner

## 2023-07-13 DIAGNOSIS — R928 Other abnormal and inconclusive findings on diagnostic imaging of breast: Secondary | ICD-10-CM

## 2023-07-15 ENCOUNTER — Other Ambulatory Visit: Payer: Self-pay | Admitting: Neurology

## 2023-07-17 NOTE — Telephone Encounter (Signed)
Last seen on 01/24/23 Follow up scheduled on 07/31/23  Per note on 01/24/23 " We will discontinue donepezil. Take 1 tablet every other day for 1 week then discontinue. "

## 2023-07-23 DIAGNOSIS — Z2821 Immunization not carried out because of patient refusal: Secondary | ICD-10-CM | POA: Insufficient documentation

## 2023-07-23 DIAGNOSIS — Z0000 Encounter for general adult medical examination without abnormal findings: Secondary | ICD-10-CM | POA: Insufficient documentation

## 2023-07-23 DIAGNOSIS — E7800 Pure hypercholesterolemia, unspecified: Secondary | ICD-10-CM | POA: Insufficient documentation

## 2023-07-23 NOTE — Assessment & Plan Note (Signed)

## 2023-07-23 NOTE — Assessment & Plan Note (Signed)
 Cholesterol levels are stable.  Continue focusing on low-fat diet.

## 2023-07-23 NOTE — Assessment & Plan Note (Signed)
 Continue f/u with Neurology and continue current medications

## 2023-07-23 NOTE — Assessment & Plan Note (Signed)
 Thyroid levels are normal. Continue current medications and will make changes as necessary

## 2023-07-23 NOTE — Assessment & Plan Note (Signed)

## 2023-07-23 NOTE — Assessment & Plan Note (Signed)
 Stable,

## 2023-07-23 NOTE — Assessment & Plan Note (Signed)
 No recent seizures, continue f/u with Neurologist.

## 2023-07-25 ENCOUNTER — Other Ambulatory Visit: Payer: Self-pay | Admitting: Neurology

## 2023-07-25 ENCOUNTER — Other Ambulatory Visit: Payer: Self-pay | Admitting: Nurse Practitioner

## 2023-07-25 DIAGNOSIS — F3341 Major depressive disorder, recurrent, in partial remission: Secondary | ICD-10-CM

## 2023-07-26 ENCOUNTER — Other Ambulatory Visit: Payer: Self-pay | Admitting: *Deleted

## 2023-07-26 DIAGNOSIS — R0982 Postnasal drip: Secondary | ICD-10-CM

## 2023-07-26 MED ORDER — BRIVARACETAM 100 MG PO TABS: 100.0000 mg | ORAL_TABLET | Freq: Two times a day (BID) | ORAL | 2 refills | Status: DC

## 2023-07-26 NOTE — Telephone Encounter (Signed)
 Last seen on 01/24/23 per note "Shannon Barry reports doing much better. No further falls or concerns of seizure activity. We will continue lamotrigine ER 200mg  daily and Briviact 100mg  BID. Labs were stable 10/2022. Memory seems stable. MMSE 29/30. I have asked her to wean donepezil as she does not feel it has been effective. We will reevaluate at follow up.    Follow up scheduled on 07/31/23

## 2023-07-28 NOTE — Code Documentation (Signed)
 done

## 2023-07-31 ENCOUNTER — Telehealth: Payer: Self-pay | Admitting: Neurology

## 2023-07-31 ENCOUNTER — Ambulatory Visit: Payer: Medicare HMO | Admitting: Neurology

## 2023-07-31 NOTE — Telephone Encounter (Signed)
 Rescheduled 09/28/23 at 2:30 pm

## 2023-07-31 NOTE — Telephone Encounter (Signed)
 LVM and sent mychart msg informing pt of need to reschedule 07/31/23 appt - MD out

## 2023-08-02 ENCOUNTER — Encounter: Payer: Self-pay | Admitting: Neurology

## 2023-08-02 ENCOUNTER — Other Ambulatory Visit: Payer: Self-pay | Admitting: *Deleted

## 2023-08-02 DIAGNOSIS — S069X5S Unspecified intracranial injury with loss of consciousness greater than 24 hours with return to pre-existing conscious level, sequela: Secondary | ICD-10-CM

## 2023-08-02 DIAGNOSIS — R561 Post traumatic seizures: Secondary | ICD-10-CM

## 2023-08-02 DIAGNOSIS — G3184 Mild cognitive impairment, so stated: Secondary | ICD-10-CM

## 2023-08-02 MED ORDER — LAMOTRIGINE ER 100 MG PO TB24: ORAL_TABLET | ORAL | 0 refills | Status: DC

## 2023-08-02 NOTE — Telephone Encounter (Signed)
 Sent refill request to pharmacy however didn't refill Aricept due to last OV states stop medication . If family calls please make them aware

## 2023-08-03 MED ORDER — DONEPEZIL HCL 5 MG PO TABS: 5.0000 mg | ORAL_TABLET | Freq: Two times a day (BID) | ORAL | 2 refills | Status: DC

## 2023-08-22 DIAGNOSIS — L821 Other seborrheic keratosis: Secondary | ICD-10-CM | POA: Diagnosis not present

## 2023-08-22 DIAGNOSIS — L218 Other seborrheic dermatitis: Secondary | ICD-10-CM | POA: Diagnosis not present

## 2023-08-22 DIAGNOSIS — D225 Melanocytic nevi of trunk: Secondary | ICD-10-CM | POA: Diagnosis not present

## 2023-08-22 DIAGNOSIS — Z7189 Other specified counseling: Secondary | ICD-10-CM | POA: Diagnosis not present

## 2023-08-22 DIAGNOSIS — L718 Other rosacea: Secondary | ICD-10-CM | POA: Diagnosis not present

## 2023-08-22 DIAGNOSIS — L814 Other melanin hyperpigmentation: Secondary | ICD-10-CM | POA: Diagnosis not present

## 2023-08-23 ENCOUNTER — Telehealth: Payer: Self-pay | Admitting: Family Medicine

## 2023-08-23 DIAGNOSIS — R561 Post traumatic seizures: Secondary | ICD-10-CM

## 2023-08-23 MED ORDER — BRIVIACT 100 MG PO TABS: 100.0000 mg | ORAL_TABLET | Freq: Two times a day (BID) | ORAL | Status: DC

## 2023-08-23 NOTE — Telephone Encounter (Signed)
 Son picked up samples. He will call [employer] first to see if pt qualifies for assistance and let us know.

## 2023-08-23 NOTE — Telephone Encounter (Signed)
 Pt's son called stating that the pt has been out of her brivaracetam (BRIVIACT) 100 MG TABS tablet and is needing to get them today. To [facility] on [address]

## 2023-08-23 NOTE — Telephone Encounter (Signed)
 Pt last saw Amy Lomax,NP 01/24/23 and next f/u 09/28/23 with Dr. Vickey Huger.   Note from last visit with Amy:  "Shannon Barry reports doing much better. No further falls or concerns of seizure activity. We will continue lamotrigine ER 200mg  daily and Briviact 100mg  BID. Labs were stable 10/2022. Memory seems stable. MMSE 29/30. I have asked her to wean donepezil as she does not feel it has been effective. We will reevaluate at follow up. She will continue close follow up with PCP for co morbidity management. She was encouraged to stay well hydrated and work on healthy lifestyle habits. She will follow up in 6 months with Dr Vickey Huger per her last note."  [facility], 2 refills. See below. Refills should be on file. I called pharmacy and spoke w/ tech who states they have 2 refills on file. Ran test claim and cost: 428.78. No PA needed. Discount card comes to 1000.00. Goodrx coupon over 1000.00 dollars. 428.78 copay for last refill 07/26/23 #60;.  I checked online to see if pt eligible for savings card and she is not. See below.   I spoke w/ Amy who provided VO to give 4 boxes of Briviact samples of 100mg  (1 month supply) to provide time for them to apply to [employer] patient assistance.   I called son. Relayed above. He will come now to pick up samples. I placed up front with info on how to apply for pt assistance via [employer].

## 2023-09-18 ENCOUNTER — Other Ambulatory Visit: Payer: Self-pay | Admitting: Neurology

## 2023-09-19 ENCOUNTER — Other Ambulatory Visit: Payer: Self-pay

## 2023-09-28 ENCOUNTER — Ambulatory Visit: Admitting: Neurology

## 2023-09-28 ENCOUNTER — Encounter: Payer: Self-pay | Admitting: Neurology

## 2023-09-28 VITALS — BP 138/66 | HR 72 | Ht 63.0 in | Wt 130.8 lb

## 2023-09-28 DIAGNOSIS — G40219 Localization-related (focal) (partial) symptomatic epilepsy and epileptic syndromes with complex partial seizures, intractable, without status epilepticus: Secondary | ICD-10-CM

## 2023-09-28 DIAGNOSIS — R561 Post traumatic seizures: Secondary | ICD-10-CM

## 2023-09-28 DIAGNOSIS — S069X5S Unspecified intracranial injury with loss of consciousness greater than 24 hours with return to pre-existing conscious level, sequela: Secondary | ICD-10-CM | POA: Diagnosis not present

## 2023-09-28 DIAGNOSIS — G3184 Mild cognitive impairment, so stated: Secondary | ICD-10-CM

## 2023-09-28 MED ORDER — LEVETIRACETAM 1000 MG PO TABS: 1000.0000 mg | ORAL_TABLET | Freq: Two times a day (BID) | ORAL | 6 refills | Status: DC

## 2023-09-28 MED ORDER — DONEPEZIL HCL 5 MG PO TABS: 5.0000 mg | ORAL_TABLET | Freq: Two times a day (BID) | ORAL | 6 refills | Status: DC
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
## 2023-09-28 NOTE — Addendum Note (Signed)
 Addended by: Neomia Banner on: 09/28/2023 03:46 PM   Modules accepted: Orders

## 2023-10-18 ENCOUNTER — Other Ambulatory Visit: Payer: Self-pay | Admitting: Nurse Practitioner

## 2023-10-18 ENCOUNTER — Ambulatory Visit (INDEPENDENT_AMBULATORY_CARE_PROVIDER_SITE_OTHER): Payer: Medicare HMO

## 2023-10-18 VITALS — BP 120/60 | HR 82 | Temp 98.6°F | Ht 63.0 in | Wt 133.8 lb

## 2023-10-18 DIAGNOSIS — H9193 Unspecified hearing loss, bilateral: Secondary | ICD-10-CM

## 2023-10-18 DIAGNOSIS — Z01110 Encounter for hearing examination following failed hearing screening: Secondary | ICD-10-CM

## 2023-10-18 DIAGNOSIS — Z1211 Encounter for screening for malignant neoplasm of colon: Secondary | ICD-10-CM | POA: Diagnosis not present

## 2023-10-18 DIAGNOSIS — Z0000 Encounter for general adult medical examination without abnormal findings: Secondary | ICD-10-CM

## 2023-10-18 NOTE — Patient Instructions (Addendum)
 Shannon Barry , Thank you for taking time out of your busy schedule to complete your Annual Wellness Visit with me. I enjoyed our conversation and look forward to speaking with you again next year. I, as well as your care team,  appreciate your ongoing commitment to your health goals. Please review the following plan we discussed and let me know if I can assist you in the future. Your Game plan/ To Do List    Referrals: If you haven't heard from the office you've been referred to, please reach out to them at the phone provided.   Follow up Visits: Next Medicare AWV with our clinical staff: 11/27/2024 at 3:20   Have you seen your provider in the last 6 months (3 months if uncontrolled diabetes)? Yes Next Office Visit with your provider: 01/08/2024 at 2:00  Clinician Recommendations:  Aim for 30 minutes of exercise or brisk walking, 6-8 glasses of water, and 5 servings of fruits and vegetables each day. Let us  know when you get your RSV      This is a list of the screening recommended for you and due dates:  Health Maintenance  Topic Date Due   Colon Cancer Screening  Never done   COVID-19 Vaccine (4 - Mixed Product risk 2024-25 season) 10/10/2023   Flu Shot  12/29/2023   Medicare Annual Wellness Visit  10/17/2024   Mammogram  06/27/2025   DTaP/Tdap/Td vaccine (2 - Td or Tdap) 10/06/2031   Pneumonia Vaccine  Completed   DEXA scan (bone density measurement)  Completed   Hepatitis C Screening  Completed   Zoster (Shingles) Vaccine  Completed   HPV Vaccine  Aged Out   Meningitis B Vaccine  Aged Out    Advanced directives: (Copy Requested) Please bring a copy of your health care power of attorney and living will to the office to be added to your chart at your convenience. You can mail to [facility] [address] [place], [place] [address] or email to [email or web address] Advance Care Planning is important because it:  [x]  Makes sure you receive the medical care that is  consistent with your values, goals, and preferences  [x]  It provides guidance to your family and loved ones and reduces their decisional burden about whether or not they are making the right decisions based on your wishes.  Follow the link provided in your after visit summary or read over the paperwork we have mailed to you to help you started getting your Advance Directives in place. If you need assistance in completing these, please reach out to us  so that we can help you!  See attachments for Preventive Care and Fall Prevention Tips.

## 2023-10-18 NOTE — Progress Notes (Signed)
 Subjective:   Malaiya Paczkowski is a 73 y.o. who presents for a Medicare Wellness preventive visit.  As a reminder, Annual Wellness Visits don't include a physical exam, and some assessments may be limited, especially if this visit is performed virtually. We may recommend an in-person follow-up visit with your provider if needed.  Visit Complete: In person    Persons Participating in Visit: Patient assisted by daughter in law.  AWV Questionnaire: No: Patient Medicare AWV questionnaire was not completed prior to this visit.  Cardiac Risk Factors include: advanced age (>81men, >61 women)     Objective:     Today's Vitals   10/18/23 1534  BP: 120/60  Pulse: 82  Temp: 98.6 F (37 C)  TempSrc: Oral  SpO2: 97%  Weight: 133 lb 12.8 oz (60.7 kg)  Height: 5\' 3"  (1.6 m)   Body mass index is 23.7 kg/m.     10/18/2023    3:41 PM 11/05/2022    2:21 PM 10/12/2022    3:18 PM 10/05/2021    4:30 PM 03/14/2019    4:13 PM 04/18/2017    1:28 PM 10/25/2015    8:52 PM  Advanced Directives  Does Patient Have a Medical Advance Directive? Yes No Yes No No No No  Type of Estate agent of [place];Living will  Healthcare Power of [place];Living will      Copy of Healthcare Power of Attorney in Chart? No - copy requested  No - copy requested      Would patient like information on creating a medical advance directive?     No - Patient declined No - Patient declined No - patient declined information    Current Medications (verified) Outpatient Encounter Medications as of 10/18/2023  Medication Sig   desvenlafaxine  (PRISTIQ ) 50 MG 24 hr tablet Take 1 tablet (50 mg total) by mouth daily.   donepezil  (ARICEPT ) 5 MG tablet Take 1 tablet (5 mg total) by mouth in the morning and at bedtime.   lamoTRIgine  (LAMICTAL  XR) 100 MG 24 hour tablet TAKE 2 tablets a day po   levETIRAcetam  (KEPPRA ) 1000 MG tablet Take 1 tablet (1,000 mg total) by mouth 2 (two) times daily.   levothyroxine   (SYNTHROID ) 88 MCG tablet Take 1 tablet (88 mcg total) by mouth daily before breakfast.   cetirizine  (ZYRTEC  ALLERGY) 10 MG tablet Take 1 tablet (10 mg total) by mouth daily. (Patient not taking: Reported on 10/18/2023)   No facility-administered encounter medications on file as of 10/18/2023.    Allergies (verified) Penicillins and Sulfa antibiotics   History: Past Medical History:  Diagnosis Date   Allergy    Depression    Seizures (HCC)    Thyroid  disease    Traumatic brain injury [facility])    Past Surgical History:  Procedure Laterality Date   right hand surgey  Right    shoulder injury  Left    traumatic head injury  N/A    URETERAL REIMPLANTATION OF TRANSPLANTED KIDNEY N/A    History reviewed. No pertinent family history. Social History   Socioeconomic History   Marital status: Divorced    Spouse name: Not on file   Number of children: Not on file   Years of education: Not on file   Highest education level: Not on file  Occupational History   Not on file  Tobacco Use   Smoking status: Never   Smokeless tobacco: Never  Vaping Use   Vaping status: Never Used  Substance and Sexual Activity  Alcohol use: No    Alcohol/week: 0.0 standard drinks of alcohol   Drug use: No   Sexual activity: Yes  Other Topics Concern   Not on file  Social History Narrative   Huge [employer] fan.   She loves college football.    She has a dog named buddy.    Social Drivers of Corporate investment banker Strain: Low Risk  (10/18/2023)   Overall Financial Resource Strain (CARDIA)    Difficulty of Paying Living Expenses: Not hard at all  Food Insecurity: No Food Insecurity (10/18/2023)   Hunger Vital Sign    Worried About Running Out of Food in the Last Year: Never true    Ran Out of Food in the Last Year: Never true  Transportation Needs: No Transportation Needs (10/18/2023)   PRAPARE - Administrator, Civil Service (Medical): No    Lack of Transportation (Non-Medical): No   Physical Activity: Sufficiently Active (10/18/2023)   Exercise Vital Sign    Days of Exercise per Week: 7 days    Minutes of Exercise per Session: 60 min  Stress: No Stress Concern Present (10/18/2023)   [employer] of Occupational Health - Occupational Stress Questionnaire    Feeling of Stress : Not at all  Social Connections: Moderately Isolated (10/18/2023)   Social Connection and Isolation Panel [NHANES]    Frequency of Communication with Friends and Family: More than three times a week    Frequency of Social Gatherings with Friends and Family: More than three times a week    Attends Religious Services: More than 4 times per year    Active Member of [employer] or Organizations: No    Attends Engineer, structural: Never    Marital Status: Divorced    Tobacco Counseling Counseling given: Not Answered    Clinical Intake:  Pre-visit preparation completed: Yes  Pain : No/denies pain     Nutritional Status: BMI of 19-24  Normal Nutritional Risks: None Diabetes: No  No results found for: "HGBA1C"   How often do you need to have someone help you when you read instructions, pamphlets, or other written materials from your doctor or pharmacy?: 1 - Never  Interpreter Needed?: No  Information entered by :: NAllen LPN   Activities of Daily Living     10/18/2023    3:36 PM  In your present state of health, do you have any difficulty performing the following activities:  Hearing? 1  Comment has an ENT consult  Vision? 1  Comment making an eye doctor appointment  Difficulty concentrating or making decisions? 1  Walking or climbing stairs? 0  Dressing or bathing? 0  Doing errands, shopping? 0  Preparing Food and eating ? N  Using the Toilet? N  In the past six months, have you accidently leaked urine? N  Do you have problems with loss of bowel control? N  Managing your Medications? N  Managing your Finances? Y  Housekeeping or managing your Housekeeping? N     Patient Care Team: Susanna Epley, FNP as PCP - General (General Practice) Nathen Balder, Skeeter Dukes, RN as [facility] Care Management  Indicate any recent Medical Services you may have received from other than [facility] providers in the past year (date may be approximate).     Assessment:    This is a routine wellness examination for Evalene.  Hearing/Vision screen Hearing Screening - Comments:: Has hearing trouble had consult for ENT Vision Screening - Comments:: Regular eye exams,  looking to start with a new doctor   Goals Addressed             This Visit's Progress    Patient Stated       10/18/2023, eat healthy       Depression Screen     10/18/2023    3:43 PM 04/12/2023    2:08 PM 10/12/2022    3:19 PM 10/10/2022    4:26 PM 10/10/2022    4:10 PM 10/05/2021    3:56 PM 03/18/2015    3:59 PM  PHQ 2/9 Scores  PHQ - 2 Score 0 0 0 0 0 0 2  PHQ- 9 Score 0 0  4   11    Fall Risk     10/18/2023    3:42 PM 04/12/2023    2:07 PM 10/12/2022    3:18 PM 10/10/2022    4:10 PM 10/05/2021    3:59 PM  Fall Risk   Falls in the past year? 0 0 0 0 1  Number falls in past yr: 0 0 0 0 1  Injury with Fall? 0 0 0 0 1  Risk for fall due to : Medication side effect No Fall Risks Medication side effect  History of fall(s)  Follow up Falls evaluation completed;Falls prevention discussed Falls evaluation completed Falls prevention discussed;Education provided;Falls evaluation completed Falls evaluation completed Falls evaluation completed    MEDICARE RISK AT HOME:  Medicare Risk at Home Any stairs in or around the home?: Yes If so, are there any without handrails?: No Home free of loose throw rugs in walkways, pet beds, electrical cords, etc?: Yes Adequate lighting in your home to reduce risk of falls?: Yes Life alert?: No Use of a cane, walker or w/c?: No Grab bars in the bathroom?: Yes Shower chair or bench in shower?: No Elevated toilet seat or a handicapped toilet?: No  TIMED  UP AND GO:  Was the test performed?  Yes  Length of time to ambulate 10 feet: 5 sec Gait steady and fast without use of assistive device  Cognitive Function: Impaired: Patient has current diagnosis of cognitive impairment.    09/28/2023    2:59 PM 01/24/2023    1:37 PM 12/13/2021    2:55 PM 10/31/2017    3:11 PM 08/03/2016   10:45 AM  MMSE - Mini Mental State Exam  Orientation to time 5 5 4 5 4   Orientation to Place 5 5 5 5 5   Registration 3 3 3 3 3   Attention/ Calculation 3 5 4 4 3   Recall 1 2 1 3 1   Language- name 2 objects 2 2 2 2 2   Language- repeat 1 1 1 1 1   Language- follow 3 step command 3 3 3 3 3   Language- read & follow direction 1 1 1 1 1   Write a sentence 1 1 1 1 1   Copy design 1 1 0 1 0  Total score 26 29 25 29 24       12/13/2021    2:55 PM  Montreal Cognitive Assessment   Visuospatial/ Executive (0/5) 2  Naming (0/3) 2  Attention: Read list of digits (0/2) 1  Attention: Read list of letters (0/1) 1  Attention: Serial 7 subtraction starting at 100 (0/3) 3  Language: Repeat phrase (0/2) 1  Language : Fluency (0/1) 1  Abstraction (0/2) 2  Delayed Recall (0/5) 2  Orientation (0/6) 5  Total 20      10/12/2022    3:20 PM  10/05/2021    4:00 PM  6CIT Screen  What Year? 0 points 4 points  What month? 0 points 0 points  What time? 0 points 3 points  Count back from 20 0 points 2 points  Months in reverse 0 points 0 points  Repeat phrase 6 points 0 points  Total Score 6 points 9 points    Immunizations Immunization History  Administered Date(s) Administered   Fluad Trivalent(High Dose 65+) 04/12/2023   Moderna Sars-Covid-2 Vaccination 08/16/2019, 09/19/2019   PNEUMOCOCCAL CONJUGATE-20 10/05/2021   Pfizer(Comirnaty)Fall Seasonal Vaccine 12 years and older 04/12/2023   Tdap 10/05/2021   Zoster Recombinant(Shingrix ) 10/10/2022, 12/13/2022    Screening Tests Health Maintenance  Topic Date Due   Colonoscopy  Never done   COVID-19 Vaccine (4 - Mixed Product  risk 2024-25 season) 10/10/2023   INFLUENZA VACCINE  12/29/2023   Medicare Annual Wellness (AWV)  10/17/2024   MAMMOGRAM  06/27/2025   DTaP/Tdap/Td (2 - Td or Tdap) 10/06/2031   Pneumonia Vaccine 27+ Years old  Completed   DEXA SCAN  Completed   Hepatitis C Screening  Completed   Zoster Vaccines- Shingrix   Completed   HPV VACCINES  Aged Out   Meningococcal B Vaccine  Aged Out    Health Maintenance  Health Maintenance Due  Topic Date Due   Colonoscopy  Never done   COVID-19 Vaccine (4 - Mixed Product risk 2024-25 season) 10/10/2023   Health Maintenance Items Addressed: Referral sent to GI for colonoscopy  Additional Screening:  Vision Screening: Recommended annual ophthalmology exams for early detection of glaucoma and other disorders of the eye.  Dental Screening: Recommended annual dental exams for proper oral hygiene  Community Resource Referral / Chronic Care Management: CRR required this visit?  No   CCM required this visit?  No   Plan:    I have personally reviewed and noted the following in the patient's chart:   Medical and social history Use of alcohol, tobacco or illicit drugs  Current medications and supplements including opioid prescriptions. Patient is not currently taking opioid prescriptions. Functional ability and status Nutritional status Physical activity Advanced directives List of other physicians Hospitalizations, surgeries, and ER visits in previous 12 months Vitals Screenings to include cognitive, depression, and falls Referrals and appointments  In addition, I have reviewed and discussed with patient certain preventive protocols, quality metrics, and best practice recommendations. A written personalized care plan for preventive services as well as general preventive health recommendations were provided to patient.   Areatha Beecham, LPN   02/11/7828   After Visit Summary: (In Person-Printed) AVS printed and given to the patient  Notes:  Nothing significant to report at this time.

## 2023-10-26 ENCOUNTER — Other Ambulatory Visit: Payer: Self-pay | Admitting: Neurology

## 2023-11-10 DIAGNOSIS — M25571 Pain in right ankle and joints of right foot: Secondary | ICD-10-CM | POA: Diagnosis not present

## 2023-11-10 DIAGNOSIS — S93491A Sprain of other ligament of right ankle, initial encounter: Secondary | ICD-10-CM | POA: Diagnosis not present

## 2024-01-08 ENCOUNTER — Encounter: Payer: Self-pay | Admitting: Nurse Practitioner

## 2024-01-08 ENCOUNTER — Ambulatory Visit: Payer: Medicare HMO | Admitting: Nurse Practitioner

## 2024-01-08 ENCOUNTER — Ambulatory Visit: Attending: Nurse Practitioner

## 2024-01-08 VITALS — BP 130/60 | HR 69 | Temp 98.2°F | Ht 63.0 in | Wt 129.4 lb

## 2024-01-08 DIAGNOSIS — R002 Palpitations: Secondary | ICD-10-CM | POA: Diagnosis not present

## 2024-01-08 DIAGNOSIS — F3160 Bipolar disorder, current episode mixed, unspecified: Secondary | ICD-10-CM

## 2024-01-08 DIAGNOSIS — E039 Hypothyroidism, unspecified: Secondary | ICD-10-CM

## 2024-01-08 DIAGNOSIS — R561 Post traumatic seizures: Secondary | ICD-10-CM

## 2024-01-08 DIAGNOSIS — E7800 Pure hypercholesterolemia, unspecified: Secondary | ICD-10-CM | POA: Diagnosis not present

## 2024-01-08 DIAGNOSIS — F1027 Alcohol dependence with alcohol-induced persisting dementia: Secondary | ICD-10-CM | POA: Diagnosis not present

## 2024-01-08 DIAGNOSIS — R0982 Postnasal drip: Secondary | ICD-10-CM | POA: Insufficient documentation

## 2024-01-08 DIAGNOSIS — H9193 Unspecified hearing loss, bilateral: Secondary | ICD-10-CM | POA: Insufficient documentation

## 2024-01-08 NOTE — Assessment & Plan Note (Signed)
 Thyroid levels are normal. Continue current medications and will make changes as necessary

## 2024-01-08 NOTE — Assessment & Plan Note (Signed)
 Cholesterol levels are stable.  Continue focusing on low-fat diet.

## 2024-01-08 NOTE — Assessment & Plan Note (Addendum)
 Intermittent palpitations occurring approximately once a week, often triggered by hot showers. Previous evaluation deemed them benign, but further assessment is warranted due to frequency. Encouraged to limit intake of caffeine and stay well hydrated with water.  - EKG done with NSR HR 64 to assess current cardiac status. - Order Zio patch for 14-day monitoring to evaluate for any significant arrhythmias.

## 2024-01-08 NOTE — Progress Notes (Signed)
 LILLETTE Kristeen JINNY Gladis, CMA,acting as a Neurosurgeon for Gaines Ada, FNP.,have documented all relevant documentation on the behalf of Gaines Ada, FNP,as directed by  Gaines Ada, FNP while in the presence of Gaines Ada, FNP.  Subjective:  Patient ID: Shannon Barry , female    DOB: 11-Jul-1950 , 73 y.o.   MRN: [id number]  Chief Complaint  Patient presents with   Hypothyroidism    Patient presents today for a thyroid  follow up, Patient reports compliance with medication. Patient denies any chest pain, SOB, or headaches. Patient has no concerns today.     HPI Discussed the use of AI scribe software for clinical note transcription with the patient, who gave verbal consent to proceed.  History of Present Illness Shannon Barry is a 73 year old female who presents for follow-up on thyroid  and cholesterol management.  She experiences a constant postnasal drip affecting her taste and causing frequent throat clearing. Allergy pills were previously ineffective, and she has not used a nasal spray.  She experiences heart palpitations described as a 'fluttering' sensation, particularly when exposed to hot water during showers. These episodes occur about once a week, are brief, and do not cause lightheadedness. The palpitations are accompanied by a 'nummy feeling' that quickly resolves. She had an EKG in the past, but not recently.  She has lost approximately four pounds since her last visit, attributing this to increased walking.  She mentions a history of a sprained ankle, which she twisted badly, but there is no recent follow-up for this issue.   She has not seen any providers since her last visit. She is due to see Dr. Chalice next month.      Past Medical History:  Diagnosis Date   Allergy    Depression    Seizures (HCC)    Thyroid  disease    Traumatic brain injury (HCC)      History reviewed. No pertinent family history.   Current Outpatient Medications:    desvenlafaxine  (PRISTIQ ) 50 MG 24  hr tablet, Take 1 tablet (50 mg total) by mouth daily., Disp: 90 tablet, Rfl: 1   donepezil  (ARICEPT ) 5 MG tablet, Take 1 tablet (5 mg total) by mouth in the morning and at bedtime., Disp: 60 tablet, Rfl: 6   lamoTRIgine  (LAMICTAL  XR) 100 MG 24 hour tablet, TAKE 2 TABLETS A DAY, Disp: 180 tablet, Rfl: 3   levETIRAcetam  (KEPPRA ) 1000 MG tablet, Take 1 tablet (1,000 mg total) by mouth 2 (two) times daily., Disp: 60 tablet, Rfl: 6   levothyroxine  (SYNTHROID ) 88 MCG tablet, Take 1 tablet (88 mcg total) by mouth daily before breakfast., Disp: 90 tablet, Rfl: 1   cetirizine  (ZYRTEC  ALLERGY) 10 MG tablet, Take 1 tablet (10 mg total) by mouth daily. (Patient not taking: Reported on 01/08/2024), Disp: 90 tablet, Rfl: 1   Allergies  Allergen Reactions   Penicillins Hives    Has patient had a PCN reaction causing immediate rash, facial/tongue/throat swelling, SOB or lightheadedness with hypotension: No Has patient had a PCN reaction causing severe rash involving mucus membranes or skin necrosis: Yes Has patient had a PCN reaction that required hospitalization No Has patient had a PCN reaction occurring within the last 10 years: Yes If all of the above answers are NO, then may proceed with Cephalosporin use.    Sulfa Antibiotics Other (See Comments)    violently ill     Review of Systems  Constitutional: Negative.   HENT:  Positive for postnasal drip.   Eyes: Negative.  Respiratory: Negative.    Cardiovascular: Negative.   Gastrointestinal: Negative.   Neurological: Negative.   Psychiatric/Behavioral: Negative.       Today's Vitals   01/08/24 1407  BP: 130/60  Pulse: 69  Temp: 98.2 F (36.8 C)  TempSrc: Oral  Weight: 129 lb 6.4 oz (58.7 kg)  Height: 5' 3 (1.6 m)  PainSc: 0-No pain   Body mass index is 22.92 kg/m.  Wt Readings from Last 3 Encounters:  01/08/24 129 lb 6.4 oz (58.7 kg)  10/18/23 133 lb 12.8 oz (60.7 kg)  09/28/23 130 lb 12.8 oz (59.3 kg)     Objective:   Physical Exam Vitals and nursing note reviewed.  Constitutional:      General: She is not in acute distress.    Appearance: Normal appearance. She is well-developed.  HENT:     Head: Normocephalic and atraumatic.  Eyes:     Pupils: Pupils are equal, round, and reactive to light.  Cardiovascular:     Rate and Rhythm: Normal rate and regular rhythm.     Pulses: Normal pulses.     Heart sounds: Normal heart sounds. No murmur heard. Pulmonary:     Effort: Pulmonary effort is normal. No respiratory distress.     Breath sounds: Normal breath sounds. No wheezing.  Musculoskeletal:        General: No swelling or tenderness. Normal range of motion.  Skin:    General: Skin is warm and dry.     Capillary Refill: Capillary refill takes less than 2 seconds.     Findings: No rash.  Neurological:     General: No focal deficit present.     Mental Status: She is alert and oriented to person, place, and time.     Cranial Nerves: No cranial nerve deficit.     Motor: No weakness.  Psychiatric:        Mood and Affect: Mood normal.        Behavior: Behavior normal.        Thought Content: Thought content normal.        Judgment: Judgment normal.      Assessment And Plan:  Bilateral hearing loss, unspecified hearing loss type Assessment & Plan: She did not go to the Audiologist, would like a referral to ENT so she can discuss other issues with him as well.   Orders: -     Ambulatory referral to ENT  Acquired hypothyroidism Assessment & Plan: Thyroid  levels are normal. Continue current medications and will make changes as necessary  Orders: -     TSH + free T4  Mixed bipolar I disorder (HCC) Assessment & Plan: Stable, no current medications    Elevated cholesterol Assessment & Plan: Cholesterol levels are stable. Continue focusing on low fat diet.   Orders: -     Lipid panel  Post-traumatic seizures (HCC) Assessment & Plan: Continue f/u with Neurology   Post-nasal  drainage Assessment & Plan: Chronic postnasal drip affecting taste and causing throat clearing. Previous allergy medication was ineffective. Discussed trial of over-the-counter nasal spray to alleviate symptoms. - Recommend trial of over-the-counter nasal spray such as Flonase or Nasonex. - Place referral to ENT for further evaluation.  Orders: -     Ambulatory referral to ENT  Palpitation Assessment & Plan: Intermittent palpitations occurring approximately once a week, often triggered by hot showers. Previous evaluation deemed them benign, but further assessment is warranted due to frequency. Encouraged to limit intake of caffeine and stay well hydrated with  water.  - EKG done with NSR HR 64 to assess current cardiac status. - Order Zio patch for 14-day monitoring to evaluate for any significant arrhythmias.  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  Patient was given opportunity to ask questions. Patient verbalized understanding of the plan and was able to repeat key elements of the plan. All questions were answered to their satisfaction.    LILLETTE Gaines Ada, FNP, have reviewed all documentation for this visit. The documentation on 01/08/24 for the exam, diagnosis, procedures, and orders are all accurate and complete.   IF YOU HAVE BEEN REFERRED TO A SPECIALIST, IT MAY TAKE 1-2 WEEKS TO SCHEDULE/PROCESS THE REFERRAL. IF YOU HAVE NOT HEARD FROM US /SPECIALIST IN TWO WEEKS, PLEASE GIVE US  A CALL AT [phone].

## 2024-01-08 NOTE — Progress Notes (Unsigned)
 EP to read.

## 2024-01-08 NOTE — Assessment & Plan Note (Signed)
 Continue f/u with Neurology

## 2024-01-08 NOTE — Assessment & Plan Note (Signed)
 Stable, no current medications

## 2024-01-08 NOTE — Assessment & Plan Note (Signed)
 She did not go to [facility], would like a referral to ENT so she can discuss other issues with him as well.

## 2024-01-08 NOTE — Assessment & Plan Note (Signed)
 Chronic postnasal drip affecting taste and causing throat clearing. Previous allergy medication was ineffective. Discussed trial of over-the-counter nasal spray to alleviate symptoms. - Recommend trial of over-the-counter nasal spray such as Flonase or Nasonex. - Place referral to ENT for further evaluation.

## 2024-01-11 DIAGNOSIS — H35372 Puckering of macula, left eye: Secondary | ICD-10-CM | POA: Diagnosis not present

## 2024-01-11 DIAGNOSIS — H40013 Open angle with borderline findings, low risk, bilateral: Secondary | ICD-10-CM | POA: Diagnosis not present

## 2024-01-11 DIAGNOSIS — H524 Presbyopia: Secondary | ICD-10-CM | POA: Diagnosis not present

## 2024-01-11 DIAGNOSIS — H25813 Combined forms of age-related cataract, bilateral: Secondary | ICD-10-CM | POA: Diagnosis not present

## 2024-01-11 DIAGNOSIS — H0100A Unspecified blepharitis right eye, upper and lower eyelids: Secondary | ICD-10-CM | POA: Diagnosis not present

## 2024-01-12 DIAGNOSIS — M5459 Other low back pain: Secondary | ICD-10-CM | POA: Diagnosis not present

## 2024-01-19 ENCOUNTER — Encounter (INDEPENDENT_AMBULATORY_CARE_PROVIDER_SITE_OTHER): Payer: Self-pay

## 2024-01-23 ENCOUNTER — Emergency Department (HOSPITAL_BASED_OUTPATIENT_CLINIC_OR_DEPARTMENT_OTHER)

## 2024-01-23 ENCOUNTER — Emergency Department (HOSPITAL_BASED_OUTPATIENT_CLINIC_OR_DEPARTMENT_OTHER)
Admission: EM | Admit: 2024-01-23 | Discharge: 2024-01-23 | Disposition: A | Discharge status: Home / Self Care | Attending: Emergency Medicine | Admitting: Emergency Medicine

## 2024-01-23 ENCOUNTER — Encounter (HOSPITAL_BASED_OUTPATIENT_CLINIC_OR_DEPARTMENT_OTHER): Payer: Self-pay | Admitting: Emergency Medicine

## 2024-01-23 DIAGNOSIS — M25552 Pain in left hip: Secondary | ICD-10-CM | POA: Diagnosis not present

## 2024-01-23 DIAGNOSIS — M47816 Spondylosis without myelopathy or radiculopathy, lumbar region: Secondary | ICD-10-CM | POA: Diagnosis not present

## 2024-01-23 DIAGNOSIS — M5442 Lumbago with sciatica, left side: Secondary | ICD-10-CM | POA: Diagnosis not present

## 2024-01-23 DIAGNOSIS — M5432 Sciatica, left side: Secondary | ICD-10-CM | POA: Insufficient documentation

## 2024-01-23 NOTE — ED Provider Notes (Signed)
 [place] EMERGENCY DEPARTMENT AT [facility] Provider Note   CSN: [id number] Arrival date & time: 01/23/24  8166     Patient presents with: No chief complaint on file.   Shannon Barry is a 73 y.o. female  who presents to the  emergency department with a chief complaint of left hip pain.  Patient states that she was recently seen for back pain which has now resolved and that now her left hip has begun to bother her.  Patient states that she has been extremely active recently stating that [place] 5 miles one way.  Patient states that she did this 3 out of 5 days last week walking a total of 10 miles each time.  Patient states that she also did some work around the house and lifted some things and that this may have aggravated her hip. Denies trauma or other injury. Patient states that she was seen by [facility] and thought to have sciatica. Patient states she is taking ibuprofen for pain which is helpful.  Denies falls, chest pain, shortness of breath, weakness.  Past medical history is significant for dementia, posttraumatic seizures, TBI, mixed bipolar 1 disorder, alcohol abuse, OCD, Alzheimer disease, acute metabolic encephalopathy, depression, thyroid  disease, etc. Denies bowel/bladder symptoms or groin numbness.   HPI     Prior to Admission medications   Medication Sig Start Date End Date Taking? Authorizing Provider  cetirizine  (ZYRTEC  ALLERGY) 10 MG tablet Take 1 tablet (10 mg total) by mouth daily. Patient not taking: Reported on 01/08/2024 10/05/21   Moore, Janece, FNP  desvenlafaxine  (PRISTIQ ) 50 MG 24 hr tablet Take 1 tablet (50 mg total) by mouth daily. 07/10/23   Georgina Speaks, FNP  donepezil  (ARICEPT ) 5 MG tablet Take 1 tablet (5 mg total) by mouth in the morning and at bedtime. 09/28/23   Dohmeier, Dedra, MD  lamoTRIgine  (LAMICTAL  XR) 100 MG 24 hour tablet TAKE 2 TABLETS A DAY 11/01/23   Dohmeier, Dedra, MD   levETIRAcetam  (KEPPRA ) 1000 MG tablet Take 1 tablet (1,000 mg total) by mouth 2 (two) times daily. 09/28/23   Dohmeier, Dedra, MD  levothyroxine  (SYNTHROID ) 88 MCG tablet Take 1 tablet (88 mcg total) by mouth daily before breakfast. 07/10/23   Georgina Speaks, FNP    Allergies: Penicillins and Sulfa antibiotics    Review of Systems  Musculoskeletal:  Positive for arthralgias (left hip pain).    Updated Vital Signs BP 97/79 (BP Location: Right Arm)   Pulse 62   Temp 98.6 F (37 C) (Oral)   Resp 20   Ht 5' 3 (1.6 m)   Wt 56.7 kg   SpO2 100%   BMI 22.14 kg/m   Physical Exam Vitals and nursing note reviewed.  Constitutional:      General: She is awake. She is not in acute distress.    Appearance: Normal appearance. She is not toxic-appearing or diaphoretic.  HENT:     Head: Normocephalic and atraumatic.  Eyes:     General: No scleral icterus. Pulmonary:     Effort: Pulmonary effort is normal. No respiratory distress.  Musculoskeletal:     Right lower leg: No edema.     Left lower leg: No edema.     Comments: Left hip area tender to palpation especially posteriorly, left lower extremity neurovascularly intact, DP pulses 2+, no appreciable weakness when comparing left lower extremity to right lower extremity  Pain in left posterior hip area  with straight leg raise of left leg, no pain with raising of right leg   Patient ambulatory without assistance with mild discomfort  All compartments of left lower extremity soft and compressible  Skin:    General: Skin is warm.     Capillary Refill: Capillary refill takes less than 2 seconds.  Neurological:     General: No focal deficit present.     Mental Status: She is alert and oriented to person, place, and time.  Psychiatric:        Mood and Affect: Mood normal.        Behavior: Behavior normal. Behavior is cooperative.     (all labs ordered are listed, but only abnormal results are displayed) Labs Reviewed - No data to  display  EKG: None  Radiology: DG Hip Unilat W or Wo Pelvis 2-3 Views Left Result Date: 01/23/2024 CLINICAL DATA:  Left hip pain.  Back injury 1 week ago. EXAM: DG HIP (WITH OR WITHOUT PELVIS) 2-3V LEFT COMPARISON:  None Available. FINDINGS: Degenerative changes in the lower lumbar spine. Pelvis and hips appear intact. No acute displaced fractures identified. SI joints and symphysis pubis are not displaced. Left hip appears intact. No evidence of acute fracture or dislocation. No focal bone lesion or bone destruction. Soft tissues are unremarkable. IMPRESSION: Degenerative changes in the lumbar spine. No acute bony abnormalities. Electronically Signed   By: Elsie Gravely M.D.   On: 01/23/2024 21:34     Procedures   Medications Ordered in the ED - No data to display                                  Medical Decision Making Amount and/or Complexity of Data Reviewed Radiology: ordered.   Patient presents to the ED for concern of left hip pain, this involves an extensive number of treatment options, and is a complaint that carries with it a high risk of complications and morbidity.  The differential diagnosis includes fracture, dislocation, osteoarthritis, autoimmune disorder, septic arthritis, sciatica, soft tissue injury, etc.   Co morbidities that complicate the patient evaluation  dementia, posttraumatic seizures, TBI, mixed bipolar 1 disorder, alcohol abuse, OCD, Alzheimer disease, acute metabolic encephalopathy, depression, thyroid  disease   Imaging Studies ordered:  I ordered imaging studies including x-ray left hip I independently visualized and interpreted imaging which showed no acute abnormality of the left hip area, osteoarthritis of lumbar spine I agree with the radiologist interpretation   Medicines ordered and prescription drug management: I have reviewed the patients home medicines and have made adjustments as needed   Test Considered:  - None   Critical  Interventions:  None   Problem List / ED Course:  73 year old female, left hip pain evaluated by [facility] and thought to have sciatica, came to the emergency department for further evaluation, vital signs stable On physical exam left hip area tender to palpation especially posteriorly along track of sciatic nerve, pain worse when bearing weight, pain with elevation of left leg Patient currently taking ibuprofen over-the-counter with relief Imaging of left hip not significant for acute abnormality, no noted fracture or dislocation At this time I feel it most likely diagnosis is possibly sciatica as this fits patient clinical picture, she has recently been involved in increased activity including walking 10 miles multiple times as well as working around the house, patient is currently ambulatory with mild discomfort Instructed patient to continue over-the-counter Tylenol  as well  as ibuprofen to help with pain and symptoms, did not prescribe prescription strength anti-inflammatory due to patient age as I feel risk outweighs benefit Instructed patient to continue to follow-up with [facility] as well as her primary care provider for ongoing diagnosis and treatment, patient understanding of this Return precautions given, recommended RICE therapy Patient discharged Most likely diagnosis at this time is sciatica, patient will symptomatically treat with over-the-counter Tylenol  and ibuprofen, follow-up with orthopedics as well as primary care provider and return if needed, RICE therapy, low clinical suspicion for acute trauma such as this does not line up with patient history, low clinical suspicion for cauda equina syndrome. Prior to discharge patient was offered a dose of Tylenol  which she refused stating that she could just take this when she gets home.   Reevaluation:  After the interventions noted above, I reevaluated the patient and found that they have :stayed the same   Social  Determinants of Health:  none   Dispostion:  After consideration of the diagnostic results and the patients response to treatment, I feel that the patent would benefit from discharge and outpatient therapy as described.     Final diagnoses:  Left hip pain  Sciatica of left side    ED Discharge Orders     None          Janetta Terrall FALCON, PA-C 01/24/24 0013    Armenta Canning, MD 01/24/24 [phone]

## 2024-01-23 NOTE — ED Provider Notes (Incomplete)
 [place] EMERGENCY DEPARTMENT AT [facility] Provider Note   CSN: [id number] Arrival date & time: 01/23/24  8166     Patient presents with: No chief complaint on file.   Shannon Barry is a 73 y.o. female  who presents to the  emergency department with a chief complaint of left hip pain.  Patient states that she was recently seen for back pain which has now resolved and that now her left hip has begun to bother her.  Patient states that she has been extremely active recently stating that [place] 5 miles [place] way.  Patient states that she did this 3 out of 5 days last week walking a total of 10 miles each time.  Patient states that she also did some work around the house and lifted some things and that this may have aggravated her hip. Denies trauma or other injury. Patient states that she was seen by [facility] and thought to have sciatica. Patient states she is taking ibuprofen for pain which is helpful.  Denies falls, chest pain, shortness of breath, weakness.  Past medical history is significant for dementia, posttraumatic seizures, TBI, mixed bipolar 1 disorder, alcohol abuse, OCD, Alzheimer disease, acute metabolic encephalopathy, depression, thyroid  disease, etc.  {Add pertinent medical, surgical, social history, OB history to HPI:32947} HPI     Prior to Admission medications   Medication Sig Start Date End Date Taking? Authorizing Provider  cetirizine  (ZYRTEC  ALLERGY) 10 MG tablet Take 1 tablet (10 mg total) by mouth daily. Patient not taking: Reported on 01/08/2024 10/05/21   Moore, Janece, FNP  desvenlafaxine  (PRISTIQ ) 50 MG 24 hr tablet Take 1 tablet (50 mg total) by mouth daily. 07/10/23   Georgina Speaks, FNP  donepezil  (ARICEPT ) 5 MG tablet Take 1 tablet (5 mg total) by mouth in the morning and at bedtime. 09/28/23   Dohmeier, Dedra, MD  lamoTRIgine  (LAMICTAL  XR) 100 MG 24 hour tablet TAKE 2 TABLETS A DAY 11/01/23    Dohmeier, Dedra, MD  levETIRAcetam  (KEPPRA ) 1000 MG tablet Take 1 tablet (1,000 mg total) by mouth 2 (two) times daily. 09/28/23   Dohmeier, Dedra, MD  levothyroxine  (SYNTHROID ) 88 MCG tablet Take 1 tablet (88 mcg total) by mouth daily before breakfast. 07/10/23   Georgina Speaks, FNP    Allergies: Penicillins and Sulfa antibiotics    Review of Systems  Updated Vital Signs BP 97/79 (BP Location: Right Arm)   Pulse 62   Temp 98.6 F (37 C) (Oral)   Resp 20   Ht 5' 3 (1.6 m)   Wt 56.7 kg   SpO2 100%   BMI 22.14 kg/m   Physical Exam  (all labs ordered are listed, but only abnormal results are displayed) Labs Reviewed - No data to display  EKG: None  Radiology: DG Hip Unilat W or Wo Pelvis 2-3 Views Left Result Date: 01/23/2024 CLINICAL DATA:  Left hip pain.  Back injury 1 week ago. EXAM: DG HIP (WITH OR WITHOUT PELVIS) 2-3V LEFT COMPARISON:  None Available. FINDINGS: Degenerative changes in the lower lumbar spine. Pelvis and hips appear intact. No acute displaced fractures identified. SI joints and symphysis pubis are not displaced. Left hip appears intact. No evidence of acute fracture or dislocation. No focal bone lesion or bone destruction. Soft tissues are unremarkable. IMPRESSION: Degenerative changes in the lumbar spine. No acute bony abnormalities. Electronically Signed   By: Elsie Gravely M.D.   On: 01/23/2024 21:34    {  Document cardiac monitor, telemetry assessment procedure when appropriate:32947} Procedures   Medications Ordered in the ED - No data to display    {Click here for ABCD2, HEART and other calculators REFRESH Note before signing:1}                              Medical Decision Making Amount and/or Complexity of Data Reviewed Radiology: ordered.   ***  {Document critical care time when appropriate  Document review of labs and clinical decision tools ie CHADS2VASC2, etc  Document your independent review of radiology images and any outside records   Document your discussion with family members, caretakers and with consultants  Document social determinants of health affecting pt's care  Document your decision making why or why not admission, treatments were needed:32947:::1}   Final diagnoses:  Left hip pain  Sciatica of left side    ED Discharge Orders     None

## 2024-01-23 NOTE — Discharge Instructions (Addendum)
 It was a pleasure taking care of you today.  Based on your history and physical exam and imaging I feel you are safe for discharge.  The most likely diagnosis at this time is possible sciatica of your left hip and posterior leg.  Please continue to take over-the-counter Tylenol  as well as ibuprofen to help with your symptoms.  Please keep in mind that the max daily dose of Tylenol  is 4000 mg/day and the max daily dose of over-the-counter ibuprofen is 1200 mg/day.  I also recommend that if symptoms persist or worsen that you follow-up with [facility] who is your orthopedic provider.  Please continue to monitor symptoms and if you experience any of the following symptoms including but not limited to unexplained weakness, severe pain, inability to walk, unexplained joint swelling, fever, chills, chest pain, shortness of breath, or other concerning symptom please return to the emergency department or seek further medical care.  I also recommend rest as well as ice to help with pain and inflammation.

## 2024-01-23 NOTE — ED Triage Notes (Signed)
 Back injury 1 week ago, now having left hip pain. Unable to stand without assistance.

## 2024-02-12 ENCOUNTER — Other Ambulatory Visit: Payer: Self-pay | Admitting: Nurse Practitioner

## 2024-02-12 DIAGNOSIS — F3160 Bipolar disorder, current episode mixed, unspecified: Secondary | ICD-10-CM

## 2024-03-12 ENCOUNTER — Ambulatory Visit (INDEPENDENT_AMBULATORY_CARE_PROVIDER_SITE_OTHER): Admitting: Otolaryngology

## 2024-03-12 VITALS — BP 101/64 | HR 67 | Temp 97.8°F | Ht 63.0 in | Wt 124.0 lb

## 2024-03-12 DIAGNOSIS — R432 Parageusia: Secondary | ICD-10-CM | POA: Diagnosis not present

## 2024-03-12 DIAGNOSIS — R0982 Postnasal drip: Secondary | ICD-10-CM

## 2024-03-12 DIAGNOSIS — J343 Hypertrophy of nasal turbinates: Secondary | ICD-10-CM | POA: Diagnosis not present

## 2024-03-12 DIAGNOSIS — R430 Anosmia: Secondary | ICD-10-CM

## 2024-03-12 DIAGNOSIS — J310 Chronic rhinitis: Secondary | ICD-10-CM | POA: Insufficient documentation

## 2024-03-12 DIAGNOSIS — H9190 Unspecified hearing loss, unspecified ear: Secondary | ICD-10-CM | POA: Diagnosis not present

## 2024-03-12 DIAGNOSIS — H903 Sensorineural hearing loss, bilateral: Secondary | ICD-10-CM

## 2024-03-12 MED ORDER — IPRATROPIUM BROMIDE 0.06 % NA SOLN: 2.0000 | Freq: Two times a day (BID) | NASAL | 12 refills | Status: AC | PRN
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
## 2024-04-04 ENCOUNTER — Ambulatory Visit: Admitting: Neurology

## 2024-05-02 ENCOUNTER — Ambulatory Visit (INDEPENDENT_AMBULATORY_CARE_PROVIDER_SITE_OTHER): Admitting: Audiology

## 2024-05-02 ENCOUNTER — Ambulatory Visit (INDEPENDENT_AMBULATORY_CARE_PROVIDER_SITE_OTHER): Admitting: Otolaryngology

## 2024-05-02 ENCOUNTER — Encounter (INDEPENDENT_AMBULATORY_CARE_PROVIDER_SITE_OTHER): Payer: Self-pay | Admitting: Otolaryngology

## 2024-05-02 VITALS — BP 124/71 | HR 72 | Temp 97.7°F | Ht 63.0 in | Wt 130.0 lb

## 2024-05-02 DIAGNOSIS — H906 Mixed conductive and sensorineural hearing loss, bilateral: Secondary | ICD-10-CM

## 2024-05-02 DIAGNOSIS — J343 Hypertrophy of nasal turbinates: Secondary | ICD-10-CM

## 2024-05-02 DIAGNOSIS — J310 Chronic rhinitis: Secondary | ICD-10-CM

## 2024-05-02 DIAGNOSIS — R0982 Postnasal drip: Secondary | ICD-10-CM
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
## 2024-05-04 DIAGNOSIS — H906 Mixed conductive and sensorineural hearing loss, bilateral: Secondary | ICD-10-CM | POA: Insufficient documentation

## 2024-05-04 NOTE — Addendum Note (Signed)
 Addended byBETHA MOCCASIN, Kore Madlock on: 05/04/2024 02:05 PM   Modules accepted: Orders

## 2024-05-04 NOTE — Progress Notes (Signed)
 Patient ID: Shannon Barry, female   DOB: 12/31/1950, 73 y.o.   MRN: [id number]  Follow-up: Chronic nasal drainage, anosmia, hearing loss  HPI: The patient is a 73 year old female who returns today for her follow-up evaluation.  She was last seen in October 2025.  At that time, she was complaining of chronic postnasal drainage, anosmia, and bilateral hearing loss.  She was treated with Atrovent  nasal spray and nasal saline irrigation.  She returns today reporting improvement in her nasal drainage.   Currently she denies any facial pain, fever, or visual change.  She continues to have bilateral hearing difficulty.  Her hearing test today shows bilateral mixed hearing loss.  She denies any otalgia, otorrhea, or vertigo.  Exam: General: Communicates without difficulty, well nourished, no acute distress. Head: Normocephalic, no evidence injury, no tenderness, facial buttresses intact without stepoff. Face/sinus: No tenderness to palpation and percussion. Facial movement is normal and symmetric. Eyes: PERRL, EOMI. No scleral icterus, conjunctivae clear. Neuro: CN II exam reveals vision grossly intact.  No nystagmus at any point of gaze. Ears: Auricles well formed without lesions.  Ear canals are intact without mass or lesion.  No erythema or edema is appreciated.  The TMs are intact without fluid. Nose: External evaluation reveals normal support and skin without lesions.  Dorsum is intact.  Anterior rhinoscopy reveals congested mucosa over anterior aspect of inferior turbinates and intact septum.  No purulence noted. Oral:  Oral cavity and oropharynx are intact, symmetric, without erythema or edema.  Mucosa is moist without lesions. Neck: Full range of motion without pain.  There is no significant lymphadenopathy.  No masses palpable.  Thyroid  bed within normal limits to palpation.  Parotid glands and submandibular glands equal bilaterally without mass.  Trachea is midline. Neuro:  CN 2-12 grossly intact.    Procedure:  Flexible Nasal Endoscopy: Description: Risks, benefits, and alternatives of flexible endoscopy were explained to the patient.  Specific mention was made of the risk of throat numbness with difficulty swallowing, possible bleeding from the nose and mouth, and pain from the procedure.  The patient gave oral consent to proceed.  The flexible scope was inserted into the right nasal cavity.  Endoscopy of the interior nasal cavity, superior, inferior, and middle meatus was performed. The sphenoid-ethmoid recess was examined. Edematous mucosa was noted.  No polyp, mass, or lesion was appreciated. Olfactory cleft was clear.  Nasopharynx was clear.  Turbinates were hypertrophied but without mass.  The procedure was repeated on the contralateral side with similar findings.  The patient tolerated the procedure well.   Her hearing test shows bilateral mixed hearing loss.  Assessment: 1.  Chronic rhinitis, with bilateral inferior turbinate hypertrophy and chronic postnasal drainage. 2.  Chronic anosmia. 3.  Bilateral mixed hearing loss.  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  Her ear canals, tympanic membranes, and middle ear spaces are otherwise normal.  Plan: 1.  The physical exam and nasal endoscopy findings are reviewed with the patient. 2.  The hearing test results are also reviewed. 3.  Continue with Atrovent  nasal spray as needed. 4.  The patient is a candidate for hearing amplification.  Hearing aid options are discussed. 5.  The patient will return for reevaluation in 1 year, sooner if needed.

## 2024-05-08 ENCOUNTER — Other Ambulatory Visit: Payer: Self-pay

## 2024-05-08 MED ORDER — DONEPEZIL HCL 5 MG PO TABS: 5.0000 mg | ORAL_TABLET | Freq: Two times a day (BID) | ORAL | 6 refills | Status: AC

## 2024-05-13 ENCOUNTER — Other Ambulatory Visit: Payer: Self-pay | Admitting: Neurology

## 2024-05-17 ENCOUNTER — Encounter (HOSPITAL_COMMUNITY): Payer: Self-pay

## 2024-05-17 ENCOUNTER — Ambulatory Visit (HOSPITAL_COMMUNITY): Payer: Self-pay | Admitting: Physician Assistant

## 2024-05-17 ENCOUNTER — Ambulatory Visit (HOSPITAL_COMMUNITY)
Admission: EM | Admit: 2024-05-17 | Discharge: 2024-05-17 | Disposition: A | Discharge status: Home / Self Care | Attending: Physician Assistant | Admitting: Physician Assistant

## 2024-05-17 ENCOUNTER — Ambulatory Visit (HOSPITAL_COMMUNITY)

## 2024-05-17 DIAGNOSIS — J069 Acute upper respiratory infection, unspecified: Secondary | ICD-10-CM

## 2024-05-17 DIAGNOSIS — R051 Acute cough: Secondary | ICD-10-CM

## 2024-05-17 LAB — POC COVID19/FLU A&B COMBO
Covid Antigen, POC: NEGATIVE
Influenza A Antigen, POC: NEGATIVE
Influenza B Antigen, POC: NEGATIVE

## 2024-05-17 MED ORDER — BENZONATATE 100 MG PO CAPS: 100.0000 mg | ORAL_CAPSULE | Freq: Three times a day (TID) | ORAL | 0 refills | Status: AC

## 2024-05-17 MED ORDER — DOXYCYCLINE HYCLATE 100 MG PO CAPS: 100.0000 mg | ORAL_CAPSULE | Freq: Two times a day (BID) | ORAL | 0 refills | Status: AC

## 2024-05-17 NOTE — ED Provider Notes (Signed)
 " [facility]    CSN: [id number] Arrival date & time: 05/17/24  1220      History   Chief Complaint Chief Complaint  Patient presents with   Cough   Hoarse   Fatigue   Nasal Congestion   Sore Throat    HPI Shannon Barry is a 73 y.o. female.   Patient presents today with a 3-day history of URI symptoms.  She reports symptoms began with a sore throat and then progressed to hoarseness, nasal congestion, cough, fatigue.  Denies any fever, chest pain, shortness of breath, nausea, vomiting, diarrhea.  She has been using previously prescribed allergy medication including fluticasone nasal spray and cetirizine .  She has not tried additional over-the-counter medication for symptom management.  She denies any recent antibiotics or steroids.  Denies history of allergies, asthma, COPD.  She does not smoke.  Denies any known sick contacts.  She has a COVID-19 vaccines.  She did have COVID several years ago but has not had it worse recently.    Past Medical History:  Diagnosis Date   Allergy    Depression    Seizures (HCC)    Thyroid  disease    Traumatic brain injury [facility])     Patient Active Problem List   Diagnosis Date Noted   Mixed conductive and sensorineural hearing loss, bilateral 05/04/2024   Hypertrophy of nasal turbinates 03/12/2024   Chronic rhinitis 03/12/2024   Palpitation 01/08/2024   Bilateral hearing loss 01/08/2024   Postnasal drip 01/08/2024   Encounter for annual health examination 07/23/2023   COVID-19 vaccination declined 07/23/2023   Elevated cholesterol 07/23/2023   Need for influenza vaccination 04/12/2023   COVID-19 vaccine administered 04/12/2023   Skin abnormalities 04/12/2023   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) 10/06/2022   Acquired hypothyroidism 10/06/2022   Medication management 01/01/2022   Recovering alcoholic in remission (HCC) 12/13/2021   Amnestic MCI (mild cognitive impairment with memory loss)  12/13/2021   Acute metabolic encephalopathy 03/14/2019   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   History of alcohol abuse 11/10/2015   Alzheimer's disease (HCC) 11/10/2015   Mixed bipolar I disorder (HCC) 09/24/2015   Alcohol abuse 09/24/2015   OCD (obsessive compulsive disorder) 09/24/2015   Seizures (HCC) 07/11/2015   Vertigo    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      Home Medications    Prior to Admission medications  Medication Sig Start Date End Date Taking? Authorizing Provider  benzonatate (TESSALON) 100 MG capsule Take 1 capsule (100 mg total) by mouth every 8 (eight) hours. 05/17/24  Yes Raileigh Sabater K, PA-C  doxycycline  (VIBRAMYCIN ) 100 MG capsule Take 1 capsule (100 mg total) by mouth 2 (two) times daily. 05/17/24  Yes Takako Minckler K, PA-C  cetirizine  (ZYRTEC  ALLERGY) 10 MG tablet Take 1 tablet (10 mg total) by mouth daily. Patient not taking: Reported on 05/02/2024 10/05/21   Georgina Speaks, FNP  desvenlafaxine  (PRISTIQ ) 50 MG 24  hr tablet TAKE 1 TABLET BY MOUTH EVERY DAY 02/14/24   Moore, Janece, FNP  donepezil  (ARICEPT ) 5 MG tablet Take 1 tablet (5 mg total) by mouth in the morning and at bedtime. 05/08/24   Dohmeier, Dedra, MD  ipratropium (ATROVENT ) 0.06 % nasal spray Place 2 sprays into both nostrils 2 (two) times daily as needed (drainage). 03/12/24   Karis Clunes, MD  lamoTRIgine  (LAMICTAL  XR) 100 MG 24 hour tablet TAKE 2 TABLETS A DAY  11/01/23   Dohmeier, Dedra, MD  levETIRAcetam  (KEPPRA ) 1000 MG tablet TAKE 1 TABLET BY MOUTH TWICE A DAY 05/14/24   Dohmeier, Dedra, MD  levothyroxine  (SYNTHROID ) 88 MCG tablet Take 1 tablet (88 mcg total) by mouth daily before breakfast. 07/10/23   Georgina Speaks, FNP    Family History History reviewed. No pertinent family history.  Social History Social History[1]   Allergies   Penicillins and Sulfa antibiotics   Review of Systems Review of Systems  Constitutional:  Positive for activity change. Negative for appetite change, fatigue and fever.  HENT:  Positive for congestion, postnasal drip, sore throat and voice change. Negative for sinus pressure, sneezing and trouble swallowing.   Respiratory:  Positive for cough. Negative for shortness of breath.   Cardiovascular:  Negative for chest pain.  Gastrointestinal:  Negative for abdominal pain, diarrhea, nausea and vomiting.  Neurological:  Negative for dizziness, light-headedness and headaches.     Physical Exam Triage Vital Signs ED Triage Vitals  Encounter Vitals Group     BP 05/17/24 1332 (!) 144/63     Girls Systolic BP Percentile --      Girls Diastolic BP Percentile --      Boys Systolic BP Percentile --      Boys Diastolic BP Percentile --      Pulse Rate 05/17/24 1332 80     Resp 05/17/24 1332 16     Temp 05/17/24 1332 99.8 F (37.7 C)     Temp Source 05/17/24 1332 Oral     SpO2 05/17/24 1332 93 %     Weight --      Height --      Head Circumference --      Peak Flow --      Pain Score 05/17/24 1331 5     Pain Loc --      Pain Education --      Exclude from Growth Chart --    No data found.  Updated Vital Signs BP (!) 144/63 (BP Location: Right Arm)   Pulse 80   Temp 99.8 F (37.7 C) (Oral)   Resp 16   SpO2 93%   Visual Acuity Right Eye Distance:   Left Eye Distance:   Bilateral Distance:    Right Eye Near:   Left Eye Near:    Bilateral Near:     Physical Exam Vitals reviewed.   Constitutional:      General: She is awake. She is not in acute distress.    Appearance: Normal appearance. She is well-developed. She is not ill-appearing.     Comments: Very pleasant female appears stated age in no acute distress sitting comfortably in exam room  HENT:     Head: Normocephalic and atraumatic.     Right Ear: Tympanic membrane, ear canal and external ear normal. Tympanic membrane is not erythematous or bulging.     Left Ear: Tympanic membrane, ear canal and external ear normal. Tympanic membrane is not erythematous or bulging.     Nose:  Right Sinus: No maxillary sinus tenderness or frontal sinus tenderness.     Left Sinus: No maxillary sinus tenderness or frontal sinus tenderness.     Mouth/Throat:     Pharynx: Uvula midline. Postnasal drip present. No oropharyngeal exudate or posterior oropharyngeal erythema.  Cardiovascular:     Rate and Rhythm: Normal rate and regular rhythm.     Heart sounds: Normal heart sounds, S1 normal and S2 normal. No murmur heard. Pulmonary:     Effort: Pulmonary effort is normal.     Breath sounds: Examination of the right-lower field reveals decreased breath sounds. Examination of the left-lower field reveals decreased breath sounds. Decreased breath sounds present. No wheezing, rhonchi or rales.  Psychiatric:        Behavior: Behavior is cooperative.      UC Treatments / Results  Labs (all labs ordered are listed, but only abnormal results are displayed) Labs Reviewed  POC COVID19/FLU A&B COMBO    EKG   Radiology No results found.  Procedures Procedures (including critical care time)  Medications Ordered in UC Medications - No data to display  Initial Impression / Assessment and Plan / UC Course  I have reviewed the triage vital signs and the nursing notes.  Pertinent labs & imaging results that were available during my care of the patient were reviewed by me and considered in my medical decision making (see chart for  details).     Patient is well-appearing, afebrile, nontoxic, nontachycardic.  Viral testing was negative in clinic today.  Chest x-ray was obtained that showed no acute cardiopulmonary disease based on my primary read we are waiting for radiologist to review the time of discharge.  We discussed that I do not think she needs an antibiotic but patient was concerned that her symptoms may not improve given she has had worsening over the past few days and so I did agree to give her a course of doxycycline  but discussed that she should not start this medication for several days.  If she does need to start this medication she is to avoid prolonged sun exposure while on this medication due to associated photosensitivity.  Recommended that she use over-the-counter medication for additional symptom relief including the previously prescribed allergy medicine.  She was given Tessalon for additional symptom relief.  We discussed that if her symptoms are improving within a few days or if anything worsens and she has high fever, worsening cough, shortness of breath, chest pain she needs to be seen immediately.  Strict return precautions given.  Excuse note provided.  Final Clinical Impressions(s) / UC Diagnoses   Final diagnoses:  Acute cough  Viral URI with cough     Discharge Instructions      You were negative for COVID and flu.  Your x-ray appeared normal.  I will contact you if the radiologist sees something else changes or treatment plan.  Continue your allergy medication as prescribed.  Use Tessalon 3 times a day to help with your cough.  As we discussed, I believe that you have a virus but if your symptoms are not improving it is reasonable to start an antibiotic in a few days.  If you need the antibiotic start doxycycline  that was sent to the pharmacy twice daily for 7 days.  Stay out of the sun while on this medication.  If you are not feeling better within a week please return for reevaluation.  If  anything worsens and you have high fever, worsening cough, shortness of  breath, chest pain, nausea/vomiting interfere with oral intake you need to be seen immediately.     ED Prescriptions     Medication Sig Dispense Auth. Provider   benzonatate (TESSALON) 100 MG capsule Take 1 capsule (100 mg total) by mouth every 8 (eight) hours. 21 capsule Hau Sanor K, PA-C   doxycycline  (VIBRAMYCIN ) 100 MG capsule Take 1 capsule (100 mg total) by mouth 2 (two) times daily. 14 capsule Corynn Solberg K, PA-C      PDMP not reviewed this encounter.     [1]  Social History Tobacco Use   Smoking status: Never   Smokeless tobacco: Never  Vaping Use   Vaping status: Never Used  Substance Use Topics   Alcohol use: No    Alcohol/week: 0.0 standard drinks of alcohol   Drug use: No     Sherrell Rocky POUR, PA-C 05/17/24 1543  "

## 2024-05-17 NOTE — ED Triage Notes (Signed)
 Patient c/o a productive cough with yellow sputum, hoarseness, nasal congestion, sore throat, and fatigue x 3 days.  Patient is taking Flonase nasal spray and Zyrtec  for her symptoms.

## 2024-05-17 NOTE — Discharge Instructions (Signed)
 You were negative for COVID and flu.  Your x-ray appeared normal.  I will contact you if the radiologist sees something else changes or treatment plan.  Continue your allergy medication as prescribed.  Use Tessalon  3 times a day to help with your cough.  As we discussed, I believe that you have a virus but if your symptoms are not improving it is reasonable to start an antibiotic in a few days.  If you need the antibiotic start doxycycline  that was sent to the pharmacy twice daily for 7 days.  Stay out of the sun while on this medication.  If you are not feeling better within a week please return for reevaluation.  If anything worsens and you have high fever, worsening cough, shortness of breath, chest pain, nausea/vomiting interfere with oral intake you need to be seen immediately.

## 2024-05-20 ENCOUNTER — Encounter: Payer: Self-pay | Admitting: Audiology

## 2024-05-27 LAB — OPHTHALMOLOGY REPORT-SCANNED

## 2024-06-09 ENCOUNTER — Other Ambulatory Visit: Payer: Self-pay | Admitting: Nurse Practitioner

## 2024-06-09 DIAGNOSIS — E039 Hypothyroidism, unspecified: Secondary | ICD-10-CM

## 2024-07-11 ENCOUNTER — Encounter: Payer: Medicare HMO | Admitting: Nurse Practitioner

## 2024-07-15 ENCOUNTER — Ambulatory Visit: Admitting: Neurology

## 2024-11-04 ENCOUNTER — Ambulatory Visit (INDEPENDENT_AMBULATORY_CARE_PROVIDER_SITE_OTHER): Admitting: Otolaryngology

## 2024-11-27 ENCOUNTER — Ambulatory Visit: Payer: Self-pay
